# Patient Record
Sex: Female | Born: 1970 | Race: Black or African American | Hispanic: No | Marital: Married | Smoking: Never smoker
Health system: Southern US, Community
[De-identification: ages and names within clinical notes are randomized; demographics above are authoritative.]

## PROBLEM LIST (undated history)

## (undated) DIAGNOSIS — F1910 Other psychoactive substance abuse, uncomplicated: Secondary | ICD-10-CM

## (undated) DIAGNOSIS — D649 Anemia, unspecified: Secondary | ICD-10-CM

## (undated) DIAGNOSIS — J45909 Unspecified asthma, uncomplicated: Secondary | ICD-10-CM

## (undated) DIAGNOSIS — G43909 Migraine, unspecified, not intractable, without status migrainosus: Secondary | ICD-10-CM

## (undated) DIAGNOSIS — D509 Iron deficiency anemia, unspecified: Secondary | ICD-10-CM

## (undated) DIAGNOSIS — R7303 Prediabetes: Secondary | ICD-10-CM

## (undated) DIAGNOSIS — G4730 Sleep apnea, unspecified: Secondary | ICD-10-CM

## (undated) DIAGNOSIS — K219 Gastro-esophageal reflux disease without esophagitis: Secondary | ICD-10-CM

## (undated) DIAGNOSIS — F319 Bipolar disorder, unspecified: Secondary | ICD-10-CM

## (undated) DIAGNOSIS — I10 Essential (primary) hypertension: Secondary | ICD-10-CM

## (undated) DIAGNOSIS — F419 Anxiety disorder, unspecified: Secondary | ICD-10-CM

## (undated) DIAGNOSIS — E669 Obesity, unspecified: Secondary | ICD-10-CM

## (undated) DIAGNOSIS — J449 Chronic obstructive pulmonary disease, unspecified: Secondary | ICD-10-CM

## (undated) DIAGNOSIS — M1990 Unspecified osteoarthritis, unspecified site: Secondary | ICD-10-CM

## (undated) DIAGNOSIS — F1911 Other psychoactive substance abuse, in remission: Secondary | ICD-10-CM

## (undated) HISTORY — DX: Unspecified asthma, uncomplicated: J45.909

## (undated) HISTORY — PX: FRACTURE SURGERY: SHX138

---

## 1999-05-25 ENCOUNTER — Emergency Department (HOSPITAL_COMMUNITY): Admission: EM | Admit: 1999-05-25 | Discharge: 1999-05-25 | Payer: Self-pay | Admitting: Emergency Medicine

## 1999-10-13 ENCOUNTER — Emergency Department (HOSPITAL_COMMUNITY): Admission: EM | Admit: 1999-10-13 | Discharge: 1999-10-13 | Payer: Self-pay | Admitting: Podiatry

## 1999-10-13 ENCOUNTER — Encounter: Payer: Self-pay | Admitting: Emergency Medicine

## 2001-08-01 ENCOUNTER — Encounter: Payer: Self-pay | Admitting: Obstetrics

## 2001-08-01 ENCOUNTER — Inpatient Hospital Stay (HOSPITAL_COMMUNITY): Admission: AD | Admit: 2001-08-01 | Discharge: 2001-08-01 | Payer: Self-pay | Admitting: Obstetrics

## 2001-08-04 ENCOUNTER — Inpatient Hospital Stay (HOSPITAL_COMMUNITY): Admission: AD | Admit: 2001-08-04 | Discharge: 2001-08-04 | Payer: Self-pay | Admitting: Obstetrics

## 2001-08-29 ENCOUNTER — Inpatient Hospital Stay (HOSPITAL_COMMUNITY): Admission: AD | Admit: 2001-08-29 | Discharge: 2001-08-29 | Payer: Self-pay | Admitting: Obstetrics

## 2001-10-10 ENCOUNTER — Inpatient Hospital Stay (HOSPITAL_COMMUNITY): Admission: AD | Admit: 2001-10-10 | Discharge: 2001-10-10 | Payer: Self-pay | Admitting: Obstetrics

## 2001-10-22 ENCOUNTER — Encounter (INDEPENDENT_AMBULATORY_CARE_PROVIDER_SITE_OTHER): Payer: Self-pay | Admitting: Specialist

## 2001-10-22 ENCOUNTER — Inpatient Hospital Stay (HOSPITAL_COMMUNITY): Admission: AD | Admit: 2001-10-22 | Discharge: 2001-10-24 | Payer: Self-pay | Admitting: Obstetrics

## 2001-11-05 ENCOUNTER — Emergency Department (HOSPITAL_COMMUNITY): Admission: EM | Admit: 2001-11-05 | Discharge: 2001-11-05 | Payer: Self-pay | Admitting: *Deleted

## 2002-11-04 ENCOUNTER — Encounter (INDEPENDENT_AMBULATORY_CARE_PROVIDER_SITE_OTHER): Payer: Self-pay | Admitting: *Deleted

## 2002-11-04 ENCOUNTER — Inpatient Hospital Stay (HOSPITAL_COMMUNITY): Admission: AD | Admit: 2002-11-04 | Discharge: 2002-11-06 | Payer: Self-pay | Admitting: *Deleted

## 2002-11-24 ENCOUNTER — Other Ambulatory Visit (HOSPITAL_COMMUNITY): Admission: RE | Admit: 2002-11-24 | Discharge: 2002-12-04 | Payer: Self-pay | Admitting: Psychiatry

## 2003-05-26 ENCOUNTER — Emergency Department (HOSPITAL_COMMUNITY): Admission: AC | Admit: 2003-05-26 | Discharge: 2003-05-26 | Payer: Self-pay

## 2003-05-26 ENCOUNTER — Encounter: Payer: Self-pay | Admitting: Emergency Medicine

## 2003-05-28 ENCOUNTER — Emergency Department (HOSPITAL_COMMUNITY): Admission: EM | Admit: 2003-05-28 | Discharge: 2003-05-28 | Payer: Self-pay

## 2003-05-31 ENCOUNTER — Encounter: Payer: Self-pay | Admitting: Orthopedic Surgery

## 2003-05-31 ENCOUNTER — Inpatient Hospital Stay (HOSPITAL_COMMUNITY): Admission: RE | Admit: 2003-05-31 | Discharge: 2003-06-04 | Payer: Self-pay | Admitting: Orthopedic Surgery

## 2003-06-26 ENCOUNTER — Emergency Department (HOSPITAL_COMMUNITY): Admission: EM | Admit: 2003-06-26 | Discharge: 2003-06-26 | Payer: Self-pay | Admitting: Emergency Medicine

## 2010-10-13 ENCOUNTER — Ambulatory Visit (HOSPITAL_COMMUNITY)
Admission: RE | Admit: 2010-10-13 | Discharge: 2010-10-13 | Payer: Self-pay | Source: Home / Self Care | Attending: Psychiatry | Admitting: Psychiatry

## 2010-10-20 ENCOUNTER — Other Ambulatory Visit (HOSPITAL_COMMUNITY)
Admission: RE | Admit: 2010-10-20 | Discharge: 2010-11-04 | Payer: Self-pay | Source: Home / Self Care | Attending: Psychiatry | Admitting: Psychiatry

## 2010-10-28 ENCOUNTER — Ambulatory Visit: Admit: 2010-10-28 | Payer: Self-pay | Admitting: Family Medicine

## 2010-10-28 ENCOUNTER — Encounter (INDEPENDENT_AMBULATORY_CARE_PROVIDER_SITE_OTHER): Payer: Self-pay | Admitting: *Deleted

## 2010-11-09 ENCOUNTER — Encounter: Payer: Self-pay | Admitting: Family Medicine

## 2010-11-12 ENCOUNTER — Ambulatory Visit
Admission: RE | Admit: 2010-11-12 | Discharge: 2010-11-12 | Payer: Self-pay | Source: Home / Self Care | Attending: Family Medicine | Admitting: Family Medicine

## 2010-11-12 DIAGNOSIS — I10 Essential (primary) hypertension: Secondary | ICD-10-CM | POA: Insufficient documentation

## 2010-11-12 DIAGNOSIS — F3289 Other specified depressive episodes: Secondary | ICD-10-CM | POA: Insufficient documentation

## 2010-11-12 DIAGNOSIS — F1421 Cocaine dependence, in remission: Secondary | ICD-10-CM | POA: Insufficient documentation

## 2010-11-12 DIAGNOSIS — K219 Gastro-esophageal reflux disease without esophagitis: Secondary | ICD-10-CM | POA: Insufficient documentation

## 2010-11-12 DIAGNOSIS — M549 Dorsalgia, unspecified: Secondary | ICD-10-CM | POA: Insufficient documentation

## 2010-11-12 DIAGNOSIS — E669 Obesity, unspecified: Secondary | ICD-10-CM | POA: Insufficient documentation

## 2010-11-12 DIAGNOSIS — F329 Major depressive disorder, single episode, unspecified: Secondary | ICD-10-CM | POA: Insufficient documentation

## 2010-11-20 NOTE — Letter (Signed)
Summary: [place] No Show Letter   at [place]  [address] [place], [place] [address]   Phone: [phone]  Fax: [phone]    10/28/2010 MRN: [id number]  HALYNN REITANO [address] [place], [place]  [address]   Dear Ms. Martell,   Our records indicate that you missed your scheduled appointment with ______Dr.Tabori_______ on ____1/10/2012______.  Please contact this office to reschedule your appointment as soon as possible.  It is important that you keep your scheduled appointments with your physician, so we can provide you the best care possible.  Please be advised that there may be a charge for "no show" appointments.    Sincerely,    at [facility]

## 2010-11-27 ENCOUNTER — Encounter: Payer: Self-pay | Admitting: Family Medicine

## 2010-11-27 ENCOUNTER — Encounter (INDEPENDENT_AMBULATORY_CARE_PROVIDER_SITE_OTHER): Payer: Self-pay | Admitting: *Deleted
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
## 2010-12-04 NOTE — Letter (Signed)
Summary: [place] No Show Letter  [place] at [place]  [address] [place], [place] [address]   Phone: [phone]  Fax: [phone]    11/27/2010 MRN: [id number]  Danielle [name] [address] [place], [place]  [address]   Dear Ms. Nordmann,   Our records indicate that you missed your scheduled appointment with ______Dr.Tabori___________ on _____2/9/12______.  Please contact this office to reschedule your appointment as soon as possible.  It is important that you keep your scheduled appointments with your physician, so we can provide you the best care possible.  Please be advised that there may be a charge for "no show" appointments.    Sincerely,   [place] at [facility]

## 2010-12-17 ENCOUNTER — Ambulatory Visit: Payer: Self-pay | Admitting: Family Medicine

## 2011-02-06 ENCOUNTER — Other Ambulatory Visit: Payer: Self-pay | Admitting: Internal Medicine

## 2011-02-06 DIAGNOSIS — Z1231 Encounter for screening mammogram for malignant neoplasm of breast: Secondary | ICD-10-CM

## 2011-02-12 ENCOUNTER — Ambulatory Visit: Payer: Self-pay
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
## 2011-06-14 ENCOUNTER — Other Ambulatory Visit: Payer: Self-pay | Admitting: Family Medicine

## 2011-06-15 NOTE — Telephone Encounter (Signed)
Last ov 11/12/10. Last filled 02/24/11

## 2011-08-29 ENCOUNTER — Other Ambulatory Visit: Payer: Self-pay | Admitting: Family Medicine

## 2011-09-01 NOTE — Telephone Encounter (Signed)
Last OV 11-12-10 last refill 8 26-12

## 2011-11-09 ENCOUNTER — Other Ambulatory Visit: Payer: Self-pay | Admitting: Family Medicine

## 2011-11-09 NOTE — Telephone Encounter (Signed)
Last OV 11-12-10 last refill 08-29-11 #60 with no refills noted 2 no show appts

## 2011-11-09 NOTE — Telephone Encounter (Signed)
No refills. Needs OV.

## 2011-11-10 NOTE — Telephone Encounter (Signed)
Tried to call pt and leave message to call office however after vm came on no option to leave message was available, thus  Letter has been mailed to pt address noted in the chart to advise they are overdue for cpe/ov/labs and the pt needs to contact office to set up appt  [facility]ed [facility] to advise not rx to be given at this time

## 2012-02-22 ENCOUNTER — Emergency Department (HOSPITAL_COMMUNITY)
Admission: EM | Admit: 2012-02-22 | Discharge: 2012-02-22 | Disposition: A | Discharge status: Home / Self Care | Payer: Medicare Other | Attending: Emergency Medicine | Admitting: Emergency Medicine

## 2012-02-22 ENCOUNTER — Emergency Department (HOSPITAL_COMMUNITY): Payer: Medicare Other

## 2012-02-22 DIAGNOSIS — M7989 Other specified soft tissue disorders: Secondary | ICD-10-CM | POA: Diagnosis not present

## 2012-02-22 DIAGNOSIS — W010XXA Fall on same level from slipping, tripping and stumbling without subsequent striking against object, initial encounter: Secondary | ICD-10-CM | POA: Diagnosis not present

## 2012-02-22 DIAGNOSIS — S8261XA Displaced fracture of lateral malleolus of right fibula, initial encounter for closed fracture: Secondary | ICD-10-CM

## 2012-02-22 DIAGNOSIS — S8263XA Displaced fracture of lateral malleolus of unspecified fibula, initial encounter for closed fracture: Secondary | ICD-10-CM | POA: Diagnosis not present

## 2012-02-22 DIAGNOSIS — M25579 Pain in unspecified ankle and joints of unspecified foot: Secondary | ICD-10-CM | POA: Insufficient documentation

## 2012-02-22 MED ORDER — ONDANSETRON 8 MG PO TBDP
8.0000 mg | ORAL_TABLET | Freq: Once | ORAL | Status: AC
  Administered 2012-02-22: 8 mg via ORAL
  Filled 2012-02-22: qty 1

## 2012-02-22 MED ORDER — PERCOCET 5-325 MG PO TABS: 1.0000 | ORAL_TABLET | ORAL | Status: AC | PRN

## 2012-02-22 MED ORDER — OXYCODONE-ACETAMINOPHEN 5-325 MG PO TABS
1.0000 | ORAL_TABLET | Freq: Once | ORAL | Status: AC
  Administered 2012-02-22: 1 via ORAL
  Filled 2012-02-22: qty 1
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
## 2012-02-22 NOTE — Discharge Instructions (Signed)
Please read the information below.  Call Dr Rennis Chris (your previous surgeon) to schedule a close follow up appointment.  Use the walker for support and keep weight off of your leg while you heal.  Elevate your leg several times throughout the day and use ice or cold compresses in addition to the pain medication.  You may take additional ibuprofen if needed for pain control but do not use additional tylenol while using the prescribed pain medication.  You may return to the ER at any time for worsening condition or any new symptoms that concern you.   Ankle Fracture A fracture is a break in the bone. A cast or splint is used to protect and keep your injured bone from moving.  HOME CARE INSTRUCTIONS   Use your crutches as directed.   To lessen the swelling, keep the injured leg elevated while sitting or lying down.   Apply ice to the injury for 15 to 20 minutes, 3 to 4 times per day while awake for 2 days. Put the ice in a plastic bag and place a thin towel between the bag of ice and your cast.   If you have a plaster or fiberglass cast:   Do not try to scratch the skin under the cast using sharp or pointed objects.   Check the skin around the cast every day. You may put lotion on any red or sore areas.   Keep your cast dry and clean.   If you have a plaster splint:   Wear the splint as directed.   You may loosen the elastic around the splint if your toes become numb, tingle, or turn cold or blue.   Do not put pressure on any part of your cast or splint; it may break. Rest your cast only on a pillow the first 24 hours until it is fully hardened.   Your cast or splint can be protected during bathing with a plastic bag. Do not lower the cast or splint into water.   Take medications as directed by your caregiver. Only take over-the-counter or prescription medicines for pain, discomfort, or fever as directed by your caregiver.   Do not drive a vehicle until your caregiver specifically tells  you it is safe to do so.   If your caregiver has given you a follow-up appointment, it is very important to keep that appointment. Not keeping the appointment could result in a chronic or permanent injury, pain, and disability. If there is any problem keeping the appointment, you must call back to this facility for assistance.  SEEK IMMEDIATE MEDICAL CARE IF:   Your cast gets damaged or breaks.   You have continued severe pain or more swelling than you did before the cast was put on.   Your skin or toenails below the injury turn blue or gray, or feel cold or numb.   There is a bad smell or new stains and/or purulent (pus like) drainage coming from under the cast.  If you do not have a window in your cast for observing the wound, a discharge or minor bleeding may show up as a stain on the outside of your cast. Report these findings to your caregiver. MAKE SURE YOU:   Understand these instructions.   Will watch your condition.   Will get help right away if you are not doing well or get worse.  Document Released: 10/02/2000 Document Revised: 09/24/2011 Document Reviewed: 05/08/2008 [facility] Patient Information 2012 [place], [place].  Cast or Splint Care  Casts and splints support injured limbs and keep bones from moving while they heal.  HOME CARE  Keep the cast or splint uncovered during the drying period.   A plaster cast can take 24 to 48 hours to dry.   A fiberglass cast will dry in less than 1 hour.   Do not rest the cast on anything harder than a pillow for 24 hours.   Do not put weight on your injured limb. Do not put pressure on the cast. Wait for your doctor's approval.   Keep the cast or splint dry.   Cover the cast or splint with a plastic bag during baths or wet weather.   If you have a cast over your chest and belly (trunk), take sponge baths until the cast is taken off.   Keep your cast or splint clean. Wash a dirty cast with a damp cloth.   Do not put any  objects under your cast or splint. Do not scratch the skin under the cast with an object.   Do not take out the padding from inside your cast.   Exercise your joints near the cast as told by your doctor.   Raise (elevate) your injured limb on 1 or 2 pillows for the first 1 to 3 days.  GET HELP RIGHT AWAY IF:  Your cast or splint cracks.   Your cast or splint is too tight or too loose.   You itch badly under the cast.   Your cast gets wet or has a soft spot.   You have a bad smell coming from the cast.   You get an object stuck under the cast.   Your skin around the cast becomes red or raw.   You have new or more pain after the cast is put on.   You have fluid leaking through the cast.   You cannot move your fingers or toes.   Your fingers or toes turn colors or are cool, painful, or puffy (swollen).   You have tingling or lose feeling (numbness) around the injured area.   You have pain or pressure under the cast.   You have trouble breathing or have shortness of breath.   You have chest pain.  MAKE SURE YOU:  Understand these instructions.   Will watch your condition.   Will get help right away if you are not doing well or get worse.  Document Released: 02/04/2011 Document Revised: 09/24/2011 Document Reviewed: 02/04/2011 [facility] Patient Information 2012 [place], [place].

## 2012-02-22 NOTE — ED Notes (Signed)
Pt reports walking down hill outside and slid and fell heard a pop and hurt right ankle.

## 2012-02-22 NOTE — ED Provider Notes (Signed)
Medical screening examination/treatment/procedure(s) were performed by non-physician practitioner and as supervising physician I was immediately available for consultation/collaboration.  Hurman Horn, MD 02/22/12 [phone]

## 2012-02-22 NOTE — ED Provider Notes (Signed)
History     CSN: [id number]  Arrival date & time 02/22/12  1236   First MD Initiated Contact with Patient 02/22/12 1309      Chief Complaint  Patient presents with  . Ankle Pain    (Consider location/radiation/quality/duration/timing/severity/associated sxs/prior treatment) HPI Comments: Patient reports she was walking down a hill and slipped, rolling her right ankle.  States she heard it pop and felt immediate pain in her lateral malleolus with radiation into her shin and dorsal foot.  Reports decreased ROM of ankle secondary to pain.  Denies other injury, weakness or numbness of her foot.    Patient is a 41 y.o. female presenting with ankle pain. The history is provided by the patient.  Ankle Pain  The incident occurred 1 to 2 hours ago. Pertinent negatives include no numbness, no muscle weakness, no loss of sensation and no tingling. She has tried nothing for the symptoms.    No past medical history on file.  No past surgical history on file.  No family history on file.  History  Substance Use Topics  . Smoking status: Not on file  . Smokeless tobacco: Not on file  . Alcohol Use: Not on file    OB History    No data available      Review of Systems  Cardiovascular: Positive for leg swelling.  Skin: Negative for color change and rash.  Neurological: Negative for tingling, weakness and numbness.  Psychiatric/Behavioral: Negative for confusion.    Allergies  Review of patient's allergies indicates no known allergies.  Home Medications   Current Outpatient Rx  Name Route Sig Dispense Refill  . CYCLOBENZAPRINE HCL 10 MG PO TABS Oral Take 10 mg by mouth at bedtime as needed. Muscles relaxion    . FLUTICASONE PROPIONATE 50 MCG/ACT NA SUSP Nasal Place 2 sprays into the nose daily.    . IBUPROFEN 800 MG PO TABS  take 1 tablet by mouth three times a day if needed for BACK PAIN with food 60 tablet 0  . OMEPRAZOLE 20 MG PO CPDR Oral Take 40 mg by mouth daily.    .  TRAMADOL HCL 50 MG PO TABS Oral Take 50 mg by mouth 2 (two) times daily as needed. pain      BP 164/102  Pulse 68  Temp(Src) 98.4 F (36.9 C) (Oral)  SpO2 100%  Physical Exam  Nursing note and vitals reviewed. Constitutional: She is oriented to person, place, and time. She appears well-developed and well-nourished. No distress.  HENT:  Head: Normocephalic and atraumatic.  Neck: Neck supple.  Pulmonary/Chest: Effort normal.  Musculoskeletal:       Legs:      Feet:       Dorsalis pedis and posterior tibialis pulses intact, sensation intact, capillary refill < 2 seconds in all toes.    Neurological: She is alert and oriented to person, place, and time.  Skin: She is not diaphoretic.    ED Course  Procedures (including critical care time)  Labs Reviewed - No data to display Dg Tibia/fibula Right  02/22/2012  *RADIOLOGY REPORT*  Clinical Data: Ankle pain post fall  RIGHT TIBIA AND FIBULA - 2 VIEW  Comparison: None.  Findings: Three views of the right tibia-fibula submitted.  No acute fracture is noted.  Postsurgical metallic fixation plate and screws noted on lateral aspect of the tibial plateau.  There is a healed fracture of the lateral tibial plateau.  Healed fracture of proximal right fibula. Degenerative changes are noted  lateral aspect of tibial plateau.  IMPRESSION: No acute fracture is noted.  Postsurgical metallic fixation plate and screws noted on lateral aspect of the tibial plateau.  There is a healed fracture of the lateral tibial plateau.  Healed fracture of proximal right fibula. Degenerative changes lateral aspect of the tibial plateau.  Original Report Authenticated By: Natasha Mead, M.D.   Dg Ankle Complete Right  02/22/2012  *RADIOLOGY REPORT*  Clinical Data: Ankle pain.  RIGHT ANKLE - COMPLETE 3+ VIEW  Comparison: None.  Findings: There is a nondisplaced fracture of the distal fibular metaphysis.  Vertical and transverse components are identified. Overlying soft tissue  swelling.  No additional evidence of fracture.  IMPRESSION: Lateral malleolar fracture with overlying soft tissue swelling.  Original Report Authenticated By: Reyes Ivan, M.D.   Dg Foot Complete Right  02/22/2012  *RADIOLOGY REPORT*  Clinical Data: Pain.  RIGHT FOOT COMPLETE - 3+ VIEW  Comparison: None.  Findings: Lateral malleolar fracture is incidentally imaged.  No additional evidence of acute fracture.  IMPRESSION: Lateral malleolar fracture is incidentally imaged.  No additional evidence of acute fracture.  Original Report Authenticated By: Reyes Ivan, M.D.    Filed Vitals:   02/22/12 1704  BP: 160/82  Pulse: 70  Temp: 98 F (36.7 C)    1. Fracture of right ankle, lateral malleolus       MDM  Patient with injury to right ankle by slip and inversion.  Pt with fractured lateral malleolus.  Placed in splint in ED.  Pt declined crutches, prefers walker which I have prescribed.  Pt previously had surgery on same leg by Dr Rennis Chris.  Pt d/c home with percocet, return precautions, ortho follow up.  Patient verbalizes understanding and agrees with plan.         Rise Patience, [place] 02/22/12 1710

## 2012-02-22 NOTE — ED Notes (Signed)
WUJ:[id number]<JY> Expected date:<BR> Expected time:12:33 PM<BR> Means of arrival:Ambulance<BR> Comments:<BR> PTAR 27 -- Fall/Ankle Injury

## 2012-03-15 ENCOUNTER — Encounter (HOSPITAL_BASED_OUTPATIENT_CLINIC_OR_DEPARTMENT_OTHER): Payer: Self-pay | Admitting: *Deleted

## 2012-03-16 ENCOUNTER — Ambulatory Visit (HOSPITAL_BASED_OUTPATIENT_CLINIC_OR_DEPARTMENT_OTHER): Payer: Medicare Other | Admitting: Anesthesiology

## 2012-03-16 ENCOUNTER — Ambulatory Visit (HOSPITAL_BASED_OUTPATIENT_CLINIC_OR_DEPARTMENT_OTHER)
Admission: RE | Admit: 2012-03-16 | Discharge: 2012-03-16 | Disposition: A | Discharge status: Home / Self Care | Payer: Medicare Other | Source: Ambulatory Visit | Attending: Orthopedic Surgery | Admitting: Orthopedic Surgery

## 2012-03-16 ENCOUNTER — Encounter (HOSPITAL_BASED_OUTPATIENT_CLINIC_OR_DEPARTMENT_OTHER): Payer: Self-pay

## 2012-03-16 ENCOUNTER — Encounter (HOSPITAL_BASED_OUTPATIENT_CLINIC_OR_DEPARTMENT_OTHER): Payer: Self-pay | Admitting: Anesthesiology

## 2012-03-16 ENCOUNTER — Encounter (HOSPITAL_BASED_OUTPATIENT_CLINIC_OR_DEPARTMENT_OTHER)
Admission: RE | Disposition: A | Discharge status: Home / Self Care | Payer: Self-pay | Source: Ambulatory Visit | Attending: Orthopedic Surgery

## 2012-03-16 DIAGNOSIS — I10 Essential (primary) hypertension: Secondary | ICD-10-CM | POA: Insufficient documentation

## 2012-03-16 DIAGNOSIS — G4730 Sleep apnea, unspecified: Secondary | ICD-10-CM | POA: Insufficient documentation

## 2012-03-16 DIAGNOSIS — X58XXXA Exposure to other specified factors, initial encounter: Secondary | ICD-10-CM | POA: Insufficient documentation

## 2012-03-16 DIAGNOSIS — K219 Gastro-esophageal reflux disease without esophagitis: Secondary | ICD-10-CM | POA: Insufficient documentation

## 2012-03-16 DIAGNOSIS — E669 Obesity, unspecified: Secondary | ICD-10-CM | POA: Insufficient documentation

## 2012-03-16 DIAGNOSIS — S8263XA Displaced fracture of lateral malleolus of unspecified fibula, initial encounter for closed fracture: Secondary | ICD-10-CM | POA: Insufficient documentation

## 2012-03-16 DIAGNOSIS — S82899A Other fracture of unspecified lower leg, initial encounter for closed fracture: Secondary | ICD-10-CM

## 2012-03-16 HISTORY — DX: Bipolar disorder, unspecified: F31.9

## 2012-03-16 HISTORY — DX: Other psychoactive substance abuse, in remission: F19.11

## 2012-03-16 HISTORY — DX: Gastro-esophageal reflux disease without esophagitis: K21.9

## 2012-03-16 HISTORY — DX: Sleep apnea, unspecified: G47.30

## 2012-03-16 HISTORY — PX: ORIF ANKLE FRACTURE: SHX5408

## 2012-03-16 SURGERY — OPEN REDUCTION INTERNAL FIXATION (ORIF) ANKLE FRACTURE
Anesthesia: General | Site: Ankle | Laterality: Right | Wound class: Clean

## 2012-03-16 MED ORDER — HYDROMORPHONE HCL PF 1 MG/ML IJ SOLN
0.2500 mg | INTRAMUSCULAR | Status: DC | PRN
  Administered 2012-03-16: 0.5 mg via INTRAVENOUS

## 2012-03-16 MED ORDER — CITRIC ACID-SODIUM CITRATE 334-500 MG/5ML PO SOLN
30.0000 mL | Freq: Once | ORAL | Status: AC
  Administered 2012-03-16: 15 mL via ORAL

## 2012-03-16 MED ORDER — SODIUM CHLORIDE 0.9 % IV SOLN: INTRAVENOUS | Status: DC

## 2012-03-16 MED ORDER — LIDOCAINE HCL (CARDIAC) 20 MG/ML IV SOLN
INTRAVENOUS | Status: DC | PRN
  Administered 2012-03-16: 80 mg via INTRAVENOUS

## 2012-03-16 MED ORDER — ONDANSETRON HCL 4 MG/2ML IJ SOLN
INTRAMUSCULAR | Status: DC | PRN
  Administered 2012-03-16: 4 mg via INTRAVENOUS

## 2012-03-16 MED ORDER — BUPIVACAINE-EPINEPHRINE PF 0.5-1:200000 % IJ SOLN
INTRAMUSCULAR | Status: DC | PRN
  Administered 2012-03-16: 50 mL

## 2012-03-16 MED ORDER — LACTATED RINGERS IV SOLN
INTRAVENOUS | Status: DC
  Administered 2012-03-16: 14:00:00 via INTRAVENOUS

## 2012-03-16 MED ORDER — DEXAMETHASONE SODIUM PHOSPHATE 4 MG/ML IJ SOLN
INTRAMUSCULAR | Status: DC | PRN
  Administered 2012-03-16: 10 mg via INTRAVENOUS

## 2012-03-16 MED ORDER — PROPOFOL 10 MG/ML IV EMUL
INTRAVENOUS | Status: DC | PRN
  Administered 2012-03-16: 300 mg via INTRAVENOUS

## 2012-03-16 MED ORDER — METOCLOPRAMIDE HCL 5 MG/ML IJ SOLN
INTRAMUSCULAR | Status: DC | PRN
  Administered 2012-03-16: 10 mg via INTRAVENOUS

## 2012-03-16 MED ORDER — MIDAZOLAM HCL 2 MG/2ML IJ SOLN
1.0000 mg | INTRAMUSCULAR | Status: DC | PRN
  Administered 2012-03-16: 3 mg via INTRAVENOUS

## 2012-03-16 MED ORDER — FENTANYL CITRATE 0.05 MG/ML IJ SOLN
INTRAMUSCULAR | Status: DC | PRN
  Administered 2012-03-16 (×2): 25 ug via INTRAVENOUS

## 2012-03-16 MED ORDER — FENTANYL CITRATE 0.05 MG/ML IJ SOLN
50.0000 ug | INTRAMUSCULAR | Status: DC | PRN
  Administered 2012-03-16 (×2): 100 ug via INTRAVENOUS

## 2012-03-16 MED ORDER — OXYCODONE-ACETAMINOPHEN 5-325 MG PO TABS: 1.0000 | ORAL_TABLET | ORAL | Status: AC | PRN

## 2012-03-16 MED ORDER — VITAMIN C 500 MG PO TABS: 500.0000 mg | ORAL_TABLET | Freq: Every day | ORAL | Status: AC

## 2012-03-16 MED ORDER — ASPIRIN EC 325 MG PO TBEC: 325.0000 mg | DELAYED_RELEASE_TABLET | Freq: Two times a day (BID) | ORAL | Status: AC

## 2012-03-16 MED ORDER — DEXAMETHASONE SODIUM PHOSPHATE 10 MG/ML IJ SOLN
INTRAMUSCULAR | Status: DC | PRN
  Administered 2012-03-16: 4 mg

## 2012-03-16 MED ORDER — CEFAZOLIN SODIUM 1-5 GM-% IV SOLN
1.0000 g | INTRAVENOUS | Status: AC
  Administered 2012-03-16: 2 g via INTRAVENOUS

## 2012-03-16 MED ORDER — CHLORHEXIDINE GLUCONATE 4 % EX LIQD: 60.0000 mL | Freq: Once | CUTANEOUS | Status: DC

## 2012-03-16 MED ORDER — LORAZEPAM 2 MG/ML IJ SOLN: 1.0000 mg | Freq: Once | INTRAMUSCULAR | Status: DC | PRN

## 2012-03-16 MED ORDER — LACTATED RINGERS IV SOLN
INTRAVENOUS | Status: DC
  Administered 2012-03-16 (×2): via INTRAVENOUS

## 2012-03-16 SURGICAL SUPPLY — 64 items
BANDAGE ELASTIC 4 VELCRO ST LF (GAUZE/BANDAGES/DRESSINGS) IMPLANT
BANDAGE ELASTIC 6 VELCRO ST LF (GAUZE/BANDAGES/DRESSINGS) ×2 IMPLANT
BIT DRILL 2.5X110 QC LCP DISP (BIT) ×2 IMPLANT
BLADE SURG 15 STRL LF DISP TIS (BLADE) ×2 IMPLANT
BLADE SURG 15 STRL SS (BLADE) ×2
BRUSH SCRUB EZ PLAIN DRY (MISCELLANEOUS) ×2 IMPLANT
BUR EGG 3PK/BX (BURR) IMPLANT
CLOTH BEACON ORANGE TIMEOUT ST (SAFETY) ×2 IMPLANT
COTTON STERILE ROLL (GAUZE/BANDAGES/DRESSINGS) ×2 IMPLANT
COVER TABLE BACK 60X90 (DRAPES) ×2 IMPLANT
CUFF TOURNIQUET SINGLE 34IN LL (TOURNIQUET CUFF) IMPLANT
DRAPE EXTREMITY T 121X128X90 (DRAPE) ×2 IMPLANT
DRAPE OEC MINIVIEW 54X84 (DRAPES) IMPLANT
DRAPE SURG 17X23 STRL (DRAPES) ×4 IMPLANT
DRSG PAD ABDOMINAL 8X10 ST (GAUZE/BANDAGES/DRESSINGS) IMPLANT
DURA STEPPER LG (CAST SUPPLIES) IMPLANT
DURA STEPPER MED (CAST SUPPLIES) IMPLANT
ELECT REM PT RETURN 9FT ADLT (ELECTROSURGICAL) ×2
ELECTRODE REM PT RTRN 9FT ADLT (ELECTROSURGICAL) ×1 IMPLANT
GAUZE SPONGE 4X4 16PLY XRAY LF (GAUZE/BANDAGES/DRESSINGS) IMPLANT
GAUZE XEROFORM 1X8 LF (GAUZE/BANDAGES/DRESSINGS) ×2 IMPLANT
GAUZE XEROFORM 5X9 LF (GAUZE/BANDAGES/DRESSINGS) IMPLANT
GLOVE BIO SURGEON STRL SZ7.5 (GLOVE) ×2 IMPLANT
GLOVE BIO SURGEON STRL SZ8 (GLOVE) ×2 IMPLANT
GLOVE BIOGEL M STRL SZ7.5 (GLOVE) ×2 IMPLANT
GLOVE BIOGEL PI IND STRL 8 (GLOVE) ×4 IMPLANT
GLOVE BIOGEL PI INDICATOR 8 (GLOVE) ×4
GLOVE SURG SS PI 8.0 STRL IVOR (GLOVE) ×2 IMPLANT
GOWN BRE IMP PREV XXLGXLNG (GOWN DISPOSABLE) IMPLANT
GOWN PREVENTION PLUS XLARGE (GOWN DISPOSABLE) IMPLANT
KIT 1/3 TUB PL 4H 49M (Orthopedic Implant) ×1 IMPLANT
NEEDLE HYPO 22GX1.5 SAFETY (NEEDLE) IMPLANT
NS IRRIG 1000ML POUR BTL (IV SOLUTION) ×2 IMPLANT
PACK BASIN DAY SURGERY FS (CUSTOM PROCEDURE TRAY) ×2 IMPLANT
PAD CAST 4YDX4 CTTN HI CHSV (CAST SUPPLIES) ×1 IMPLANT
PADDING CAST ABS 4INX4YD NS (CAST SUPPLIES)
PADDING CAST ABS COTTON 4X4 ST (CAST SUPPLIES) IMPLANT
PADDING CAST COTTON 4X4 STRL (CAST SUPPLIES) ×1
PENCIL BUTTON HOLSTER BLD 10FT (ELECTRODE) ×2 IMPLANT
PROS 1/3 TUB PL 4H 49M (Orthopedic Implant) ×2 IMPLANT
SCREW CORTEX 3.5 16MM (Screw) ×2 IMPLANT
SCREW CORTEX 3.5 22MM (Screw) ×2 IMPLANT
SCREW LOCK CORT ST 3.5X16 (Screw) ×2 IMPLANT
SCREW LOCK CORT ST 3.5X22 (Screw) ×2 IMPLANT
SHEET MEDIUM DRAPE 40X70 STRL (DRAPES) ×4 IMPLANT
SLEEVE SCD COMPRESS KNEE MED (MISCELLANEOUS) IMPLANT
SPLINT FAST PLASTER 5X30 (CAST SUPPLIES) ×20
SPLINT PLASTER CAST FAST 5X30 (CAST SUPPLIES) ×20 IMPLANT
SPONGE GAUZE 4X4 12PLY (GAUZE/BANDAGES/DRESSINGS) ×2 IMPLANT
STOCKINETTE 6  STRL (DRAPES) ×1
STOCKINETTE 6 STRL (DRAPES) ×1 IMPLANT
SUCTION FRAZIER TIP 10 FR DISP (SUCTIONS) ×4 IMPLANT
SUT ETHILON 3 0 PS 1 (SUTURE) IMPLANT
SUT ETHILON 4 0 PS 2 18 (SUTURE) ×6 IMPLANT
SUT VIC AB 2-0 SH 27 (SUTURE)
SUT VIC AB 2-0 SH 27XBRD (SUTURE) IMPLANT
SUT VIC AB 3-0 PS1 18 (SUTURE) ×1
SUT VIC AB 3-0 PS1 18XBRD (SUTURE) ×1 IMPLANT
SYR BULB 3OZ (MISCELLANEOUS) ×2 IMPLANT
TOWEL OR 17X24 6PK STRL BLUE (TOWEL DISPOSABLE) ×4 IMPLANT
TOWEL OR NON WOVEN STRL DISP B (DISPOSABLE) ×2 IMPLANT
TUBE CONNECTING 20X1/4 (TUBING) ×4 IMPLANT
UNDERPAD 30X30 INCONTINENT (UNDERPADS AND DIAPERS) ×2 IMPLANT
WATER STERILE IRR 1000ML POUR (IV SOLUTION) ×2 IMPLANT

## 2012-03-16 NOTE — Anesthesia Postprocedure Evaluation (Signed)
  Anesthesia Post-op Note  [name]  Procedure(s) Performed: Procedure(s) (LRB): OPEN REDUCTION INTERNAL FIXATION (ORIF) ANKLE FRACTURE (Right)  Patient Location: PACU  Anesthesia Type: GA combined with regional for post-op pain  Level of Consciousness: awake and alert   Airway and Oxygen Therapy: Patient Spontanous Breathing  Post-op Pain: mild  Post-op Assessment: Post-op Vital signs reviewed, Patient's Cardiovascular Status Stable, Respiratory Function Stable and Patent Airway  Post-op Vital Signs: stable  Complications: No apparent anesthesia complications

## 2012-03-16 NOTE — Transfer of Care (Signed)
Immediate Anesthesia Transfer of Care Note  [name]  Procedure(s) Performed: Procedure(s) (LRB): OPEN REDUCTION INTERNAL FIXATION (ORIF) ANKLE FRACTURE (Right)  Patient Location: PACU  Anesthesia Type: GA combined with regional for post-op pain  Level of Consciousness: awake, alert  and oriented  Airway & Oxygen Therapy: Patient Spontanous Breathing and Patient connected to face mask oxygen  Post-op Assessment: Report given to PACU RN and Post -op Vital signs reviewed and stable  Post vital signs: Reviewed and stable  Complications: No apparent anesthesia complications

## 2012-03-16 NOTE — Progress Notes (Signed)
Assisted Dr. Kasik with right, popliteal block. Side rails up, monitors on throughout procedure. See vital signs in flow sheet. Tolerated Procedure well. 

## 2012-03-16 NOTE — H&P (Signed)
  H&P documentation: Placed to be scanned history and physical exam in chart.  -History and Physical Reviewed  -Patient has been re-examined  -No change in the plan of care  Frannie Shedrick A  

## 2012-03-16 NOTE — Brief Op Note (Signed)
03/16/2012  3:36 PM  PATIENT:  Danielle [name]  41 y.o. female  PRE-OPERATIVE DIAGNOSIS:  right lateral malleolus fracture  POST-OPERATIVE DIAGNOSIS:  right lateral malleolus fracture  PROCEDURE:  Procedure(s) (LRB): OPEN REDUCTION INTERNAL FIXATION (ORIF) ANKLE FRACTURE (Right)  SURGEON:  Surgeon(s) and Role:    * Sherri Rad, MD - Primary  PHYSICIAN ASSISTANT: Rexene Edison, PAC   ASSISTANTS: Above   ANESTHESIA:   general  EBL:  Total I/O In: 100 [I.V.:100] Out: -   BLOOD ADMINISTERED:none  DRAINS: none   LOCAL MEDICATIONS USED:  NONE  SPECIMEN:  No Specimen  DISPOSITION OF SPECIMEN:  N/A  COUNTS:  YES  TOURNIQUET:  * No tourniquets in log *  DICTATION: .Other Dictation: Dictation Number [id number]  PLAN OF CARE: Discharge to home after PACU  PATIENT DISPOSITION:  PACU - hemodynamically stable.   Delay start of Pharmacological VTE agent (>24hrs) due to surgical blood loss or risk of bleeding: no

## 2012-03-16 NOTE — Discharge Instructions (Signed)
Post Anesthesia Home Care Instructions  Activity: Get plenty of rest for the remainder of the day. A responsible adult should stay with you for 24 hours following the procedure.  For the next 24 hours, DO NOT: -Drive a car -Advertising copywriter -Drink alcoholic beverages -Take any medication unless instructed by your physician -Make any legal decisions or sign important papers.  Meals: Start with liquid foods such as gelatin or soup. Progress to regular foods as tolerated. Avoid greasy, spicy, heavy foods. If nausea and/or vomiting occur, drink only clear liquids until the nausea and/or vomiting subsides. Call your physician if vomiting continues.  Special Instructions/Symptoms: Your throat may feel dry or sore from the anesthesia or the breathing tube placed in your throat during surgery. If this causes discomfort, gargle with warm salt water. The discomfort should disappear within 24 hours.  Call your surgeon if you experience:   1.  Fever over 101.0. 2.  Inability to urinate. 3.  Nausea and/or vomiting. 4.  Extreme swelling or bruising at the surgical site. 5.  Continued bleeding from the incision. 6.  Increased pain, redness or drainage from the incision. 7.  Problems related to your pain medication.   Regional Anesthesia Blocks  1. Numbness or the inability to move the "blocked" extremity may last from 3-48 hours after placement. The length of time depends on the medication injected and your individual response to the medication. If the numbness is not going away after 48 hours, call your surgeon.  2. The extremity that is blocked will need to be protected until the numbness is gone and the  Strength has returned. Because you cannot feel it, you will need to take extra care to avoid injury. Because it may be weak, you may have difficulty moving it or using it. You may not know what position it is in without looking at it while the block is in effect.  3. For blocks in the legs  and feet, returning to weight bearing and walking needs to be done carefully. You will need to wait until the numbness is entirely gone and the strength has returned. You should be able to move your leg and foot normally before you try and bear weight or walk. You will need someone to be with you when you first try to ensure you do not fall and possibly risk injury.  4. Bruising and tenderness at the needle site are common side effects and will resolve in a few days.  5. Persistent numbness or new problems with movement should be communicated to the surgeon or the [facility] [phone] [facility] [phone]). Regional Anesthesia Blocks  1. Numbness or the inability to move the "blocked" extremity may last from 3-48 hours after placement. The length of time depends on the medication injected and your individual response to the medication. If the numbness is not going away after 48 hours, call your surgeon.  2. The extremity that is blocked will need to be protected until the numbness is gone and the  Strength has returned. Because you cannot feel it, you will need to take extra care to avoid injury. Because it may be weak, you may have difficulty moving it or using it. You may not know what position it is in without looking at it while the block is in effect.  3. For blocks in the legs and feet, returning to weight bearing and walking needs to be done carefully. You will need to wait until the numbness  is entirely gone and the strength has returned. You should be able to move your leg and foot normally before you try and bear weight or walk. You will need someone to be with you when you first try to ensure you do not fall and possibly risk injury.  4. Bruising and tenderness at the needle site are common side effects and will resolve in a few days.  5. Persistent numbness or new problems with movement should be communicated to the surgeon or the [facility] [phone] [facility] [phone]).

## 2012-03-16 NOTE — Anesthesia Preprocedure Evaluation (Signed)
Anesthesia Evaluation  Patient identified by MRN, date of birth, ID band Patient awake    Reviewed: Allergy & Precautions, H&P , NPO status , Patient's Chart, lab work & pertinent test results  Airway Mallampati: II TM Distance: <3 FB Neck ROM: Full    Dental   Pulmonary sleep apnea ,    Pulmonary exam normal       Cardiovascular hypertension, Rhythm:Regular Rate:Tachycardia     Neuro/Psych Anxiety Depression    GI/Hepatic GERD-  ,(+)     substance abuse  cocaine use,   Endo/Other  Morbid obesity  Renal/GU      Musculoskeletal   Abdominal (+) + obese,   Peds  Hematology   Anesthesia Other Findings   Reproductive/Obstetrics                           Anesthesia Physical Anesthesia Plan  ASA: III  Anesthesia Plan: General   Post-op Pain Management:    Induction: Intravenous  Airway Management Planned: LMA  Additional Equipment:   Intra-op Plan:   Post-operative Plan: Extubation in OR  Informed Consent: I have reviewed the patients History and Physical, chart, labs and discussed the procedure including the risks, benefits and alternatives for the proposed anesthesia with the patient or authorized representative who has indicated his/her understanding and acceptance.     Plan Discussed with: CRNA and Surgeon  Anesthesia Plan Comments:         Anesthesia Quick Evaluation

## 2012-03-16 NOTE — Anesthesia Procedure Notes (Addendum)
Anesthesia Regional Block:  Popliteal block  Pre-Anesthetic Checklist: ,, timeout performed, Correct Patient, Correct Site, Correct Laterality, Correct Procedure, Correct Position, site marked, Risks and benefits discussed,  Surgical consent,  Pre-op evaluation,  At surgeon's request and post-op pain management  Laterality: Right  Prep: chloraprep       Needles:  Injection technique: Single-shot  Needle Type: Stimiplex     Needle Length: 15cm 15 cm   Needle insertion depth: 14 cm   Additional Needles:  Procedures: nerve stimulator Popliteal block  Nerve Stimulator or Paresthesia:  Response: 0.5 mA,   Additional Responses:   Narrative:  Start time: 03/16/2012 2:18 PM End time: 03/16/2012 2:30 PM Injection made incrementally with aspirations every 5 mL. Anesthesiologist: Dr Gypsy Balsam  Additional Notes: [id number] R Popliteal Nerve Block POP CHG prep, sterile tech #21g 15cm stim needle w/stim down to .48ma Multiple neg asp Marc .5% w/epi 1:200000 total 50cc+ decadron 4mg  infiltrated No compl Dr Gypsy Balsam   Procedure Name: LMA Insertion Date/Time: 03/16/2012 2:47 PM Performed by: Burna Cash Pre-anesthesia Checklist: Patient identified, Emergency Drugs available, Suction available and Patient being monitored Patient Re-evaluated:Patient Re-evaluated prior to inductionOxygen Delivery Method: Circle System Utilized Preoxygenation: Pre-oxygenation with 100% oxygen Intubation Type: IV induction Ventilation: Mask ventilation without difficulty LMA: LMA with gastric port inserted LMA Size: 4.0 Number of attempts: 1 Placement Confirmation: positive ETCO2 Tube secured with: Tape Dental Injury: Teeth and Oropharynx as per pre-operative assessment

## 2012-03-17 NOTE — Op Note (Signed)
NAMEMarland [name]  [name], [name] NO.:  [id number]  MEDICAL RECORD NO.:  [id number]  LOCATION:                                 FACILITY:  PHYSICIAN:  Leonides Grills, M.D.     DATE OF BIRTH:  23-Jul-1971  DATE OF PROCEDURE:  03/16/2012 DATE OF DISCHARGE:                              OPERATIVE REPORT   PREOPERATIVE DIAGNOSES: 1. Closed right chronic lateral malleolus fracture. 2. Obesity.  POSTOPERATIVE DIAGNOSIS:  Closed right chronic lateral malleolus fracture.  OPERATION: 1. Open reduction and internal fixation of right lateral malleolus     fracture. 2. Stress x-rays, right ankle.  ANESTHESIA:  General.  SURGEON:  Leonides Grills, MD  ASSISTANT:  Richardean Canal, PA-C  ESTIMATED BLOOD LOSS:  Minimal.  TOURNIQUET:  None.  COMPLICATIONS:  None.  DISPOSITION:  Stable to PR.  INDICATION:  This is a 41 year old female who sustained a right lateral malleolus fracture.  She had medial deltoid tenderness at followup x-ray at approximately 2 weeks postop.  She had lateral subluxation of the talus with the ankle mortise.  After long discussion with the patient, we elected to proceed with an open reduction and internal fixation. However, during the time period after this, she required preoperative clearance and a sleep study.  This delayed her posting.  Once we had all this done, we were then able to proceed with the above procedure and all risks, which include infection, nerve or vessel injury, wound healing problems, persistent pain, worsening pain, prolonged recovery, stiffness, arthritis, nonunion, malunion, hardware irritation, hardware failure, DVT, PE, and possible intra-articular osteochondral lesion, scar tissue and arthroscopy were all explained.  Questions were encouraged and answered.  OPERATION:  The patient was brought to the operating room and placed in a supine position after adequate general endotracheal tube anesthesia was administered as well as Ancef  1 g IV piggyback.  Bumps were placed on the right ipsilateral hip, internally rotating the right lower extremity.  All bony prominences were well padded.  The right lower extremity was then prepped and draped in a sterile manner.  Due to the patient's obesity, we elected not to apply a tourniquet.  Her obesity also made the entire procedure more difficult due to bleeding exposure. Once the right lower extremity was prepped and draped in a sterile manner, a longitudinal incision full thickness down to bone was then made over the right lateral malleolus.  Dissection was carried down and direct to bone.  Fracture site was then encountered.  The fracture site was not healed.  We were able to free up the fracture site with both a Freer, osteotome and a small lamina spreader.  Once this was done, we then anatomically reduced the fracture with a two-point reduction clamp. We then obtained x-rays to verify, this was anatomically reduced.  We then placed a 4-hole one-third tubular plate in an antiglide configuration.  We then sequentially placed two 3.5-mm fully-threaded cortical set screws proximal to the fracture site.  We then placed an interfragmentary screw through the plate across the fracture site.  We then placed one more distal screw above the plafond across again the lateral malleolus.  We then obtained stress x-rays in the AP, mortise  and lateral views, which showed no gross motion, fixation, proposition, and excellent alignment as well.  No lateral subluxation of the talus with ankle mortise or diastasis of the distal tib-fib syndesmosis.  The area was copiously irrigated with normal saline.  Subcu was closed with 3-0 Vicryl, skin was closed with 4-0 nylon over the wounds.  Sterile dressing was applied.  Modified Jones dressing was applied with the ankle in neutral dorsiflexion.  The patient was stable to PR.     Leonides Grills, M.D.     PB/MEDQ  D:  03/16/2012  T:  03/16/2012   Job:  [id number]

## 2012-03-18 ENCOUNTER — Encounter (HOSPITAL_BASED_OUTPATIENT_CLINIC_OR_DEPARTMENT_OTHER): Payer: Self-pay | Admitting: Orthopedic Surgery

## 2012-07-03 ENCOUNTER — Encounter (HOSPITAL_COMMUNITY): Payer: Self-pay

## 2012-07-03 ENCOUNTER — Other Ambulatory Visit (HOSPITAL_COMMUNITY): Payer: Medicare Other

## 2012-07-03 ENCOUNTER — Emergency Department (HOSPITAL_COMMUNITY)
Admission: EM | Admit: 2012-07-03 | Discharge: 2012-07-03 | Disposition: A | Discharge status: Home / Self Care | Payer: Medicare Other | Attending: Emergency Medicine | Admitting: Emergency Medicine

## 2012-07-03 ENCOUNTER — Emergency Department (HOSPITAL_COMMUNITY): Payer: Medicare Other

## 2012-07-03 DIAGNOSIS — M79609 Pain in unspecified limb: Secondary | ICD-10-CM

## 2012-07-03 DIAGNOSIS — Z79899 Other long term (current) drug therapy: Secondary | ICD-10-CM | POA: Insufficient documentation

## 2012-07-03 DIAGNOSIS — R0789 Other chest pain: Secondary | ICD-10-CM | POA: Insufficient documentation

## 2012-07-03 DIAGNOSIS — M542 Cervicalgia: Secondary | ICD-10-CM | POA: Insufficient documentation

## 2012-07-03 DIAGNOSIS — K219 Gastro-esophageal reflux disease without esophagitis: Secondary | ICD-10-CM | POA: Insufficient documentation

## 2012-07-03 DIAGNOSIS — G4730 Sleep apnea, unspecified: Secondary | ICD-10-CM | POA: Insufficient documentation

## 2012-07-03 DIAGNOSIS — F319 Bipolar disorder, unspecified: Secondary | ICD-10-CM | POA: Insufficient documentation

## 2012-07-03 LAB — RAPID URINE DRUG SCREEN, HOSP PERFORMED
Amphetamines: NOT DETECTED
Barbiturates: NOT DETECTED
Benzodiazepines: NOT DETECTED
Cocaine: NOT DETECTED
Opiates: NOT DETECTED
Tetrahydrocannabinol: NOT DETECTED

## 2012-07-03 LAB — BASIC METABOLIC PANEL
BUN: 12 mg/dL (ref 6–23)
CO2: 28 mEq/L (ref 19–32)
Calcium: 8.9 mg/dL (ref 8.4–10.5)
Chloride: 103 mEq/L (ref 96–112)
Creatinine, Ser: 0.72 mg/dL (ref 0.50–1.10)
GFR calc Af Amer: >90 mL/min (ref >90)
GFR calc non Af Amer: >90 mL/min (ref >90)
Glucose, Bld: 90 mg/dL (ref 70–99)
Potassium: 4.6 mEq/L (ref 3.5–5.1)
Sodium: 137 mEq/L (ref 135–145)

## 2012-07-03 LAB — URINALYSIS, ROUTINE W REFLEX MICROSCOPIC
Bilirubin Urine: NEGATIVE
Glucose, UA: NEGATIVE mg/dL
Hgb urine dipstick: NEGATIVE
Ketones, ur: NEGATIVE mg/dL
Nitrite: POSITIVE — AB
Protein, ur: NEGATIVE mg/dL
Specific Gravity, Urine: 1.022 (ref 1.005–1.030)
Urobilinogen, UA: 2 mg/dL — ABNORMAL HIGH (ref 0.0–1.0)
pH: 7 (ref 5.0–8.0)

## 2012-07-03 LAB — CBC WITH DIFFERENTIAL/PLATELET
Basophils Absolute: 0 10*3/uL (ref 0.0–0.1)
Basophils Relative: 1 % (ref 0–1)
Eosinophils Absolute: 0.1 10*3/uL (ref 0.0–0.7)
Eosinophils Relative: 1 % (ref 0–5)
HCT: 32.9 % — ABNORMAL LOW (ref 36.0–46.0)
Hemoglobin: 11 g/dL — ABNORMAL LOW (ref 12.0–15.0)
Lymphocytes Relative: 46 % (ref 12–46)
Lymphs Abs: 3.6 10*3/uL (ref 0.7–4.0)
MCH: 28.9 pg (ref 26.0–34.0)
MCHC: 33.4 g/dL (ref 30.0–36.0)
MCV: 86.4 fL (ref 78.0–100.0)
Monocytes Absolute: 0.8 10*3/uL (ref 0.1–1.0)
Monocytes Relative: 11 % (ref 3–12)
Neutro Abs: 3.3 10*3/uL (ref 1.7–7.7)
Neutrophils Relative %: 42 % — ABNORMAL LOW (ref 43–77)
Platelets: 336 10*3/uL (ref 150–400)
RBC: 3.81 MIL/uL — ABNORMAL LOW (ref 3.87–5.11)
RDW: 14.2 % (ref 11.5–15.5)
WBC: 7.7 10*3/uL (ref 4.0–10.5)

## 2012-07-03 LAB — POCT I-STAT TROPONIN I: Troponin i, poc: 0 ng/mL (ref 0.00–0.08)

## 2012-07-03 LAB — URINE MICROSCOPIC-ADD ON

## 2012-07-03 LAB — D-DIMER, QUANTITATIVE: D-Dimer, Quant: 1 ug/mL-FEU — ABNORMAL HIGH (ref 0.00–0.48)

## 2012-07-03 MED ORDER — DEXTROSE 5 % IV SOLN
1.0000 g | INTRAVENOUS | Status: DC
  Administered 2012-07-03: 1 g via INTRAVENOUS
  Filled 2012-07-03: qty 10

## 2012-07-03 MED ORDER — CEPHALEXIN 500 MG PO CAPS: 500.0000 mg | ORAL_CAPSULE | Freq: Four times a day (QID) | ORAL | Status: AC

## 2012-07-03 NOTE — Progress Notes (Signed)
VASCULAR LAB PRELIMINARY  PRELIMINARY  PRELIMINARY  PRELIMINARY  Right lower extremity venous Doppler completed.    Preliminary report:  No obvious evidence of DVT or SVT noted in the right lower extremity.  Hali Balgobin, 07/03/2012, 6:02 PM

## 2012-07-03 NOTE — ED Provider Notes (Signed)
History     CSN: [id number]  Arrival date & time 07/03/12  1324   First MD Initiated Contact with Patient 07/03/12 1514      Chief Complaint  Patient presents with  . Chest Pain    (Consider location/radiation/quality/duration/timing/severity/associated sxs/prior treatment) Patient is a 41 y.o. female presenting with chest pain. The history is provided by the patient and a friend. No language interpreter was used.  Chest Pain The chest pain began 2 days ago. Chest pain occurs constantly. The chest pain is improving. At its most intense, the pain is at 8/10. The pain is currently at 6/10. The severity of the pain is moderate. The quality of the pain is described as heavy and sharp. The pain radiates to the left neck and left shoulder. Chest pain is worsened by certain positions. Primary symptoms include shortness of breath and dizziness. Pertinent negatives for primary symptoms include no fever, no nausea and no vomiting.  Dizziness does not occur with nausea, vomiting, weakness or diaphoresis.  Associated symptoms include near-syncope.  Pertinent negatives for associated symptoms include no diaphoresis and no weakness. She tried aspirin for the symptoms. Risk factors include obesity and sedentary lifestyle.  Her past medical history is significant for sleep apnea.  Pertinent negatives for past medical history include no aneurysm, no anxiety/panic attacks, no aortic aneurysm, no aortic dissection, no arrhythmia, no CAD, no cancer, no COPD, no CHF, no diabetes, no DVT, no MI, no PE, no PVD, no seizures, no strokes and no thyroid problem.  Her family medical history is significant for CAD in family, diabetes in family, heart disease in family, hyperlipidemia in family, hypertension in family, PE in family and stroke in family.  Pertinent negatives for family medical history include: no early MI in family and no sudden death in family.  Procedure history is negative for cardiac catheterization,  echocardiogram, persantine thallium, stress echo, stress thallium and exercise treadmill test.    41 year old female coming in with complaint of left chest pain that has been intermittent for 2 days but currently it is constant to the left chest. States that she's also having fleeting sharp pains in her left chest that lasted approximately one second. States that the fleeting sharp pains are worse with movement. States that pain radiates to her left neck and left shoulder. States that she has used a muscle rub and it has made it better. Also she had a dizzy spell and fell onto the door this morning. Had right ankle surgery 3 months ago with right calf pain. Past medical history of PEs and her family sister and mother. States that she's short of breath with exertion. O2 sats 100% on r/a.  PCP is Dr. Ree Edman at [facility]. She took 325 mg of aspirin prior to arrival. Poor historian past medical history of high cholesterol, bipolar, sleep apnea, GERD. States that she has been wearing a stocking on her right lower extremity but is too tight she does not wear it. States that she has been having some acid reflux as well. States that her weight is 254 pounds.   Past Medical History  Diagnosis Date  . GERD (gastroesophageal reflux disease)   . Sleep apnea     states had test-no cpap ordered-does snore  . Bipolar 1 disorder   . 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 Fracture surgery     rt femer/knee-plate-screws-  . Orif ankle  fracture 03/16/2012    Procedure: OPEN REDUCTION INTERNAL FIXATION (ORIF) ANKLE FRACTURE;  Surgeon: Sherri Rad, MD;  Location: [facility];  Service: Orthopedics;  Laterality: Right;  right lateral malleolus fracture    History reviewed. No pertinent family history.  History  Substance Use Topics  . Smoking status: Never Smoker   . Smokeless tobacco: Not on file  . Alcohol Use: Yes      occ    OB History    Grav Para Term Preterm Abortions TAB SAB Ect Mult Living                  Review of Systems  Constitutional: Negative.  Negative for fever, chills and diaphoresis.  HENT: Positive for neck pain.   Eyes: Negative.   Respiratory: Positive for shortness of breath.   Cardiovascular: Positive for chest pain and near-syncope. Negative for leg swelling.       R calf pain  Gastrointestinal: Negative.  Negative for nausea and vomiting.  Musculoskeletal: Negative for gait problem.  Skin:       Healed over scars to R ankle.  Neurological: Positive for dizziness. Negative for seizures and weakness.  Psychiatric/Behavioral: Negative.   All other systems reviewed and are negative.    Allergies  Percocet  Home Medications   Current Outpatient Rx  Name Route Sig Dispense Refill  . CYCLOBENZAPRINE HCL 10 MG PO TABS Oral Take 10 mg by mouth at bedtime as needed. Muscles relaxion    . DICLOFENAC SODIUM 75 MG PO TBEC Oral Take 75 mg by mouth 2 (two) times daily.    Marland Kitchen ESOMEPRAZOLE MAGNESIUM 40 MG PO CPDR Oral Take 40 mg by mouth daily before breakfast.    . IBUPROFEN 800 MG PO TABS Oral Take 800 mg by mouth every 8 (eight) hours as needed. For pain    . VITAMIN C 500 MG PO TABS Oral Take 1 tablet (500 mg total) by mouth daily. 90 tablet 0    BP 122/77  Pulse 80  Temp 98.4 F (36.9 C) (Oral)  Resp 18  SpO2 100%  Physical Exam  Nursing note and vitals reviewed. Constitutional: She is oriented to person, place, and time. She appears well-developed and well-nourished.       Morbid obese  HENT:  Head: Normocephalic and atraumatic.  Eyes: Conjunctivae normal and EOM are normal. Pupils are equal, round, and reactive to light.  Neck: Normal range of motion. Neck supple.  Cardiovascular: Normal rate.   Pulmonary/Chest: Effort normal and breath sounds normal. No respiratory distress. She has no wheezes.  Abdominal: Soft. Bowel sounds are normal. She exhibits no  distension. There is no tenderness.  Musculoskeletal: Normal range of motion. She exhibits no edema and no tenderness.  Neurological: She is alert and oriented to person, place, and time. She has normal reflexes.  Skin: Skin is warm and dry.  Psychiatric: She has a normal mood and affect.    ED Course  Procedures (including critical care time)   Difficult IV start #22 in the R forearm   Labs Reviewed  CBC WITH DIFFERENTIAL  BASIC METABOLIC PANEL  URINALYSIS, ROUTINE W REFLEX MICROSCOPIC  URINE RAPID DRUG SCREEN (HOSP PERFORMED)  D-DIMER, QUANTITATIVE   No results found.   No diagnosis found.    MDM   Date: 07/03/2012  Rate: 78  Rhythm: normal sinus rhythm  QRS Axis: normal  Intervals: normal  ST/T Wave abnormalities: normal  Conduction Disutrbances:none  Narrative Interpretation:   Old  EKG Reviewed: none available   41 year old female with morbid obesity complaining of left chest pain that's been intermittent with fleeting sharp pain that lasts seconds. Recent ankle surgery with a negative Doppler study of her right lower calf pain. D-dimer was 1.0. Patient opted not to have the CAT scan today. Patient did have a positive UTI she Gram of Rocephin in the ER for this. She will go home on Keflex by mouth. Patient will sign out AMA today and return if she has any severe sharp chest pain. Chest x-ray which was reviewed by myself was unremarkable. All other labs unremarkable. Patient is slightly anemic hemoglobin 11. White count was 7.7. Troponin was negative. She understands to followup with her PCP this week. She also understands to return to the ER for worsening chest pain shortness of breath.   Labs Reviewed  CBC WITH DIFFERENTIAL - Abnormal; Notable for the following:    RBC 3.81 (*)     Hemoglobin 11.0 (*)     HCT 32.9 (*)     Neutrophils Relative 42 (*)     All other components within normal limits  URINALYSIS, ROUTINE W REFLEX MICROSCOPIC - Abnormal; Notable for  the following:    APPearance CLOUDY (*)     Urobilinogen, UA 2.0 (*)     Nitrite POSITIVE (*)     Leukocytes, UA MODERATE (*)     All other components within normal limits  D-DIMER, QUANTITATIVE - Abnormal; Notable for the following:    D-Dimer, Quant 1.00 (*)     All other components within normal limits  URINE MICROSCOPIC-ADD ON - Abnormal; Notable for the following:    Squamous Epithelial / LPF MANY (*)     Bacteria, UA MANY (*)     All other components within normal limits  BASIC METABOLIC PANEL  URINE RAPID DRUG SCREEN (HOSP PERFORMED)  POCT I-STAT TROPONIN I           Remi Haggard, NP 07/03/12 1825

## 2012-07-03 NOTE — ED Notes (Signed)
Pt placed in room from hallway; pt undressed, in gown, on monitor, continuous pulse oximetry and blood pressure cuff; EKG performed; family at bedside

## 2012-07-03 NOTE — ED Notes (Signed)
EMS reports chest pain for 2 days off and on, moving to neck and left shoulder, pain with breathing and palpation, took ASA prior to EMS arrival

## 2012-07-03 NOTE — ED Provider Notes (Signed)
Medical screening examination/treatment/procedure(s) were performed by non-physician practitioner and as supervising physician I was immediately available for consultation/collaboration.   Loren Racer, MD 07/03/12 2222

## 2012-08-04 ENCOUNTER — Other Ambulatory Visit: Payer: Self-pay | Admitting: Internal Medicine

## 2012-08-04 DIAGNOSIS — Z1231 Encounter for screening mammogram for malignant neoplasm of breast: Secondary | ICD-10-CM

## 2012-09-06 ENCOUNTER — Ambulatory Visit
Admission: RE | Admit: 2012-09-06 | Discharge: 2012-09-06 | Disposition: A | Discharge status: Home / Self Care | Payer: Medicare Other | Source: Ambulatory Visit | Attending: Internal Medicine | Admitting: Internal Medicine

## 2012-09-06 DIAGNOSIS — Z1231 Encounter for screening mammogram for malignant neoplasm of breast: Secondary | ICD-10-CM

## 2013-04-19 ENCOUNTER — Encounter (HOSPITAL_COMMUNITY): Payer: Self-pay | Admitting: Emergency Medicine

## 2013-04-19 ENCOUNTER — Emergency Department (INDEPENDENT_AMBULATORY_CARE_PROVIDER_SITE_OTHER)
Admission: EM | Admit: 2013-04-19 | Discharge: 2013-04-19 | Disposition: A | Discharge status: Home / Self Care | Payer: Medicare Other | Source: Home / Self Care | Attending: Emergency Medicine | Admitting: Emergency Medicine

## 2013-04-19 DIAGNOSIS — M549 Dorsalgia, unspecified: Secondary | ICD-10-CM

## 2013-04-19 LAB — POCT URINALYSIS DIP (DEVICE)
Bilirubin Urine: NEGATIVE
Glucose, UA: NEGATIVE mg/dL
Hgb urine dipstick: NEGATIVE
Ketones, ur: NEGATIVE mg/dL
Nitrite: NEGATIVE
Protein, ur: NEGATIVE mg/dL
Specific Gravity, Urine: 1.02 (ref 1.005–1.030)
Urobilinogen, UA: 2 mg/dL — ABNORMAL HIGH (ref 0.0–1.0)
pH: 7 (ref 5.0–8.0)

## 2013-04-19 LAB — POCT I-STAT, CHEM 8
BUN: 9 mg/dL (ref 6–23)
Calcium, Ion: 1.12 mmol/L (ref 1.12–1.23)
Chloride: 102 mEq/L (ref 96–112)
Creatinine, Ser: 0.8 mg/dL (ref 0.50–1.10)
Glucose, Bld: 78 mg/dL (ref 70–99)
HCT: 36 % (ref 36.0–46.0)
Hemoglobin: 12.2 g/dL (ref 12.0–15.0)
Potassium: 3.7 mEq/L (ref 3.5–5.1)
Sodium: 141 mEq/L (ref 135–145)
TCO2: 26 mmol/L (ref 0–100)

## 2013-04-19 MED ORDER — HYDROCODONE-ACETAMINOPHEN 5-325 MG PO TABS: 2.0000 | ORAL_TABLET | ORAL | Status: DC | PRN

## 2013-04-19 MED ORDER — IBUPROFEN 800 MG PO TABS: 800.0000 mg | ORAL_TABLET | Freq: Three times a day (TID) | ORAL | Status: DC

## 2013-04-19 NOTE — ED Provider Notes (Signed)
Medical screening examination/treatment/procedure(s) were performed by non-physician practitioner and as supervising physician I was immediately available for consultation/collaboration.  Leslee Home, M.D.  Reuben Likes, MD 04/19/13 [phone]

## 2013-04-19 NOTE — ED Provider Notes (Signed)
History    CSN: [id number] Arrival date & time 04/19/13  1352  First MD Initiated Contact with Patient 04/19/13 1530     Chief Complaint  Patient presents with  . Back Pain   (Consider location/radiation/quality/duration/timing/severity/associated sxs/prior Treatment) Patient is a 42 y.o. female presenting with back pain. The history is provided by the patient. No language interpreter was used.  Back Pain Quality:  Aching Radiates to:  Does not radiate Pain severity:  Moderate Pain is:  Worse during the day Timing:  Constant Progression:  Worsening Chronicity:  New Relieved by:  Nothing Worsened by:  Nothing tried Ineffective treatments:  None tried Pt worried about kidney problems.   Pt reports she has several family members who have had dialysis.    Past Medical History  Diagnosis Date  . GERD (gastroesophageal reflux disease)   . Sleep apnea     states had test-no cpap ordered-does snore  . Bipolar 1 disorder   . 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 Fracture surgery      rt femer/knee-plate-screws-  . Orif ankle fracture  03/16/2012    Procedure: OPEN REDUCTION INTERNAL FIXATION (ORIF) ANKLE FRACTURE;  Surgeon: Sherri Rad, MD;  Location: [facility];  Service: Orthopedics;  Laterality: Right;  right lateral malleolus fracture   No family history on file. History  Substance Use Topics  . Smoking status: Never Smoker   . Smokeless tobacco: Not on file  . Alcohol Use: No     Comment: occ   OB History   Grav Para Term Preterm Abortions TAB SAB Ect Mult Living                 Review of Systems  Musculoskeletal: Positive for back pain.  All other systems reviewed and are negative.    Allergies  Percocet  Home Medications   Current Outpatient Rx  Name  Route  Sig  Dispense  Refill  . cyclobenzaprine (FLEXERIL) 10 MG tablet   Oral   Take 10 mg by mouth at bedtime as  needed. Muscles relaxion         . diclofenac (VOLTAREN) 75 MG EC tablet   Oral   Take 75 mg by mouth 2 (two) times daily.         Marland Kitchen esomeprazole (NEXIUM) 40 MG capsule   Oral   Take 40 mg by mouth daily before breakfast.         . ibuprofen (ADVIL,MOTRIN) 800 MG tablet   Oral   Take 800 mg by mouth every 8 (eight) hours as needed. For pain          BP 150/75  Pulse 68  Temp(Src) 98.2 F (36.8 C) (Oral)  Resp 22  SpO2 100%  LMP 03/20/2013 Physical Exam  Nursing note and vitals reviewed. Constitutional: She appears well-developed and well-nourished.  HENT:  Head: Normocephalic and atraumatic.  Right Ear: External ear normal.  Left Ear: External ear normal.  Eyes: Conjunctivae are normal. Pupils are equal, round, and reactive to light.  Neck: Normal range of motion. Neck supple.  Cardiovascular: Normal rate.   Abdominal: Soft. There is no tenderness.  Musculoskeletal: Normal range of motion.  Neurological: She is alert.  Skin: Skin is warm.  Psychiatric: She has a normal mood and affect.    ED Course  Procedures (including critical care time) Labs Reviewed  POCT URINALYSIS DIP (DEVICE) -  Abnormal; Notable for the following:    Urobilinogen, UA 2.0 (*)    Leukocytes, UA TRACE (*)    All other components within normal limits  POCT I-STAT, CHEM 8   No results found. 1. Acute back pain     MDM  Urine negative,   Istat normal.    Pt advised to follow up with her MD.    Elson Areas, PA-C 04/19/13 [phone]

## 2013-04-19 NOTE — ED Notes (Signed)
Chronic back pain.  Patient complains of back pain and right epigastric pain.  This episode of pain started one week ago.

## 2013-11-04 ENCOUNTER — Encounter (HOSPITAL_COMMUNITY): Payer: Self-pay | Admitting: Emergency Medicine

## 2013-11-04 ENCOUNTER — Emergency Department (HOSPITAL_COMMUNITY): Payer: Medicare Other

## 2013-11-04 ENCOUNTER — Emergency Department (HOSPITAL_COMMUNITY)
Admission: EM | Admit: 2013-11-04 | Discharge: 2013-11-04 | Disposition: A | Discharge status: Home / Self Care | Payer: Medicare Other | Attending: Emergency Medicine | Admitting: Emergency Medicine

## 2013-11-04 DIAGNOSIS — Z885 Allergy status to narcotic agent status: Secondary | ICD-10-CM | POA: Insufficient documentation

## 2013-11-04 DIAGNOSIS — G43909 Migraine, unspecified, not intractable, without status migrainosus: Secondary | ICD-10-CM | POA: Insufficient documentation

## 2013-11-04 DIAGNOSIS — S39012A Strain of muscle, fascia and tendon of lower back, initial encounter: Secondary | ICD-10-CM

## 2013-11-04 DIAGNOSIS — S7001XA Contusion of right hip, initial encounter: Secondary | ICD-10-CM

## 2013-11-04 DIAGNOSIS — S7000XA Contusion of unspecified hip, initial encounter: Secondary | ICD-10-CM | POA: Insufficient documentation

## 2013-11-04 DIAGNOSIS — Y9301 Activity, walking, marching and hiking: Secondary | ICD-10-CM | POA: Insufficient documentation

## 2013-11-04 DIAGNOSIS — S8002XA Contusion of left knee, initial encounter: Secondary | ICD-10-CM

## 2013-11-04 DIAGNOSIS — S335XXA Sprain of ligaments of lumbar spine, initial encounter: Secondary | ICD-10-CM | POA: Insufficient documentation

## 2013-11-04 DIAGNOSIS — S8000XA Contusion of unspecified knee, initial encounter: Secondary | ICD-10-CM | POA: Insufficient documentation

## 2013-11-04 DIAGNOSIS — F1911 Other psychoactive substance abuse, in remission: Secondary | ICD-10-CM | POA: Insufficient documentation

## 2013-11-04 DIAGNOSIS — Y9229 Other specified public building as the place of occurrence of the external cause: Secondary | ICD-10-CM | POA: Insufficient documentation

## 2013-11-04 DIAGNOSIS — M129 Arthropathy, unspecified: Secondary | ICD-10-CM | POA: Insufficient documentation

## 2013-11-04 DIAGNOSIS — R209 Unspecified disturbances of skin sensation: Secondary | ICD-10-CM | POA: Insufficient documentation

## 2013-11-04 DIAGNOSIS — W010XXA Fall on same level from slipping, tripping and stumbling without subsequent striking against object, initial encounter: Secondary | ICD-10-CM | POA: Insufficient documentation

## 2013-11-04 HISTORY — DX: Unspecified osteoarthritis, unspecified site: M19.90

## 2013-11-04 HISTORY — DX: Migraine, unspecified, not intractable, without status migrainosus: G43.909

## 2013-11-04 MED ORDER — IBUPROFEN 800 MG PO TABS: 800.0000 mg | ORAL_TABLET | Freq: Three times a day (TID) | ORAL | Status: DC

## 2013-11-04 MED ORDER — ONDANSETRON 4 MG PO TBDP
8.0000 mg | ORAL_TABLET | Freq: Once | ORAL | Status: AC
  Administered 2013-11-04: 8 mg via ORAL
  Filled 2013-11-04: qty 2

## 2013-11-04 MED ORDER — HYDROCODONE-ACETAMINOPHEN 5-325 MG PO TABS
1.0000 | ORAL_TABLET | Freq: Once | ORAL | Status: AC
  Administered 2013-11-04: 1 via ORAL
  Filled 2013-11-04: qty 1

## 2013-11-04 MED ORDER — CYCLOBENZAPRINE HCL 10 MG PO TABS: 10.0000 mg | ORAL_TABLET | Freq: Two times a day (BID) | ORAL | Status: DC | PRN

## 2013-11-04 NOTE — ED Notes (Signed)
Pt changed into gown for xray. Given crackers to prevent nausea from meds as pts reports is hx.

## 2013-11-04 NOTE — ED Provider Notes (Signed)
CSN: [id number]     Arrival date & time 11/04/13  0865 History  This chart was scribed for non-physician practitioner working with Janice Norrie, MD by Stacy Gardner, ED scribe. This patient was seen in room TR06C/TR06C and the patient's care was started at 10:05 AM.  None    No chief complaint on file.  (Consider location/radiation/quality/duration/timing/severity/associated sxs/prior Treatment) The history is provided by the patient and medical records. No language interpreter was used.   HPI Comments: Danielle [name] is a 43 y.o. female who presents to the Emergency Department complaining of fall that occurred moments PTA.  She was walking out of [place] when she tripped over a rug. She is unsure of how she landed. Pt is able to walk after the accident and to bear weight on her lower extremites. Pt is experiencing right sided pain that is gradually worsening.  Pt states having constant, mild pain to her left knee. She describes the pain as "stiffness" with a tight sensation. In addition she is experiencing constant, mild back and neck pain. Denies numbness and tingling. Nothing seems to resolve her symptoms. Pt has a past medical hx of GERD and sleep apnea. She has also has a past mental medical hx of bipolar 1 disorder and remission from drug abuse.    Past Medical History  Diagnosis Date  . GERD (gastroesophageal reflux disease)   . Sleep apnea     states had test-no cpap ordered-does snore  . Bipolar 1 disorder   . 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 Fracture surgery      rt femer/knee-plate-screws-  . Orif ankle fracture  03/16/2012    Procedure: OPEN REDUCTION INTERNAL FIXATION (ORIF) ANKLE FRACTURE;  Surgeon: Colin Rhein, MD;  Location: [place] [place];  Service: Orthopedics;  Laterality: Right;  right lateral malleolus fracture   No family history on file. History  Substance Use  Topics  . Smoking status: Never Smoker   . Smokeless tobacco: Not on file  . Alcohol Use: No     Comment: occ   OB History   Grav Para Term Preterm Abortions TAB SAB Ect Mult Living                 Review of Systems  Musculoskeletal: Positive for arthralgias, back pain, myalgias and neck pain. Negative for gait problem.  Neurological: Negative for weakness and numbness.  All other systems reviewed and are negative.    Allergies  Percocet  Home Medications   Current Outpatient Rx  Name  Route  Sig  Dispense  Refill  . cyclobenzaprine (FLEXERIL) 10 MG tablet   Oral   Take 10 mg by mouth at bedtime as needed. Muscles relaxion         . diclofenac (VOLTAREN) 75 MG EC tablet   Oral   Take 75 mg by mouth 2 (two) times daily.         Marland Kitchen esomeprazole (NEXIUM) 40 MG capsule   Oral   Take 40 mg by mouth daily before breakfast.         . HYDROcodone-acetaminophen (NORCO/VICODIN) 5-325 MG per tablet   Oral   Take 2 tablets by mouth every 4 (four) hours as needed for pain.   20 tablet   0   . ibuprofen (ADVIL,MOTRIN) 800 MG tablet   Oral   Take 800 mg by mouth every 8 (eight) hours  as needed. For pain         . ibuprofen (ADVIL,MOTRIN) 800 MG tablet   Oral   Take 1 tablet (800 mg total) by mouth 3 (three) times daily.   21 tablet   0    BP 163/77  Pulse 78  Temp(Src) 97.5 F (36.4 C) (Oral)  Resp 24  Ht 5\' 4"  (1.626 m)  Wt 370 lb (167.831 kg)  BMI 63.48 kg/m2  SpO2 98%  LMP 10/21/2013 Physical Exam  Nursing note and vitals reviewed. Constitutional: She is oriented to person, place, and time. She appears well-developed and well-nourished. No distress.  HENT:  Head: Normocephalic and atraumatic.  Eyes: EOM are normal. Pupils are equal, round, and reactive to light.  Neck: Normal range of motion. Neck supple. Muscular tenderness present. No spinous process tenderness present. No tracheal deviation present.    Cardiovascular: Normal rate.    Pulmonary/Chest: Effort normal. No respiratory distress.  Abdominal: Soft. She exhibits no distension.  Musculoskeletal: Normal range of motion. She exhibits tenderness. She exhibits no edema.       Right hip: She exhibits tenderness. She exhibits normal range of motion and normal strength.       Left knee: She exhibits normal range of motion, no swelling and no effusion. Tenderness found. Medial joint line and lateral joint line tenderness noted.       Thoracic back: She exhibits normal range of motion, no tenderness and no bony tenderness.       Lumbar back: She exhibits tenderness and bony tenderness. She exhibits normal range of motion.       Legs: Neurological: She is alert and oriented to person, place, and time.  Skin: Skin is warm and dry.  Psychiatric: She has a normal mood and affect. Her behavior is normal.    ED Course  Procedures (including critical care time) DIAGNOSTIC STUDIES: Oxygen Saturation is 98% on room air, normal by my interpretation.    COORDINATION OF CARE:  10:09 AM Discussed course of care with pt which includes pain medications, x-ray to her right hip, left knee and lumbar spine . Pt understands and agrees.    Labs Review Labs Reviewed - No data to display Imaging Review Dg Lumbar Spine Complete  11/04/2013   CLINICAL DATA:  For history of fall complaining of pain in the low back.  EXAM: LUMBAR SPINE - COMPLETE 4+ VIEW  COMPARISON:  No priors.  FINDINGS: Five views of the lumbar spine demonstrate no definite acute displaced fracture or compression type fracture. Alignment is anatomic. Multilevel degenerative disc disease, most severe in the lower thoracic spine, particularly at T11-T12. Multilevel facet arthropathy. No defects of the pars interarticularis are noted.  IMPRESSION: 1. No acute radiographic abnormality of the lumbar spine. 2. Multilevel degenerative disc disease and thoracolumbar spondylosis.   Electronically Signed   By: Vinnie Langton M.D.    On: 11/04/2013 11:14   Dg Knee Complete 4 Views Left  11/04/2013   CLINICAL DATA:  History of recent fall.  Pain in the left knee.  EXAM: LEFT KNEE - COMPLETE 4+ VIEW  COMPARISON:  No priors.  FINDINGS: Four views of the left knee demonstrate no acute displaced fracture, subluxation or dislocation. There is joint space narrowing, subchondral sclerosis, subchondral cyst formation and osteophyte formation in a tricompartmental distribution, most severe in the medial and patellofemoral compartments, compatible with osteoarthritis. A small enthesophyte is seen extending off the superior aspect of the patella.  IMPRESSION: 1. No acute radiographic abnormality  of the left knee. 2. Moderate tricompartmental osteoarthritis most severe in the medial and patellofemoral compartments.   Electronically Signed   By: Vinnie Langton M.D.   On: 11/04/2013 11:10    EKG Interpretation   None       MDM  Lumbar strain Right hip contusion Left knee contusion  Patient is here s/p mechanical fall while at [facility].  X-rays negative at this time.  No alarming signs to suggest cauda equina.  Vomited the norco so the patient is requesting ibuprofen for pain.  I personally performed the services described in this documentation, which was scribed in my presence. The recorded information has been reviewed and is accurate.   Idalia Needle Joelyn Oms, PA-C 11/04/13 1159

## 2013-11-04 NOTE — Discharge Instructions (Signed)
Back Pain, Adult Low back pain is very common. About 1 in 5 people have back pain.The cause of low back pain is rarely dangerous. The pain often gets better over time.About half of people with a sudden onset of back pain feel better in just 2 weeks. About 8 in 10 people feel better by 6 weeks.  CAUSES Some common causes of back pain include:  Strain of the muscles or ligaments supporting the spine.  Wear and tear (degeneration) of the spinal discs.  Arthritis.  Direct injury to the back. DIAGNOSIS Most of the time, the direct cause of low back pain is not known.However, back pain can be treated effectively even when the exact cause of the pain is unknown.Answering your caregiver's questions about your overall health and symptoms is one of the most accurate ways to make sure the cause of your pain is not dangerous. If your caregiver needs more information, he or she may order lab work or imaging tests (X-rays or MRIs).However, even if imaging tests show changes in your back, this usually does not require surgery. HOME CARE INSTRUCTIONS For many people, back pain returns.Since low back pain is rarely dangerous, it is often a condition that people can learn to [facility] their own.   Remain active. It is stressful on the back to sit or stand in one place. Do not sit, drive, or stand in one place for more than 30 minutes at a time. Take short walks on level surfaces as soon as pain allows.Try to increase the length of time you walk each day.  Do not stay in bed.Resting more than 1 or 2 days can delay your recovery.  Do not avoid exercise or work.Your body is made to move.It is not dangerous to be active, even though your back may hurt.Your back will likely heal faster if you return to being active before your pain is gone.  Pay attention to your body when you bend and lift. Many people have less discomfortwhen lifting if they bend their knees, keep the load close to their bodies,and  avoid twisting. Often, the most comfortable positions are those that put less stress on your recovering back.  Find a comfortable position to sleep. Use a firm mattress and lie on your side with your knees slightly bent. If you lie on your back, put a pillow under your knees.  Only take over-the-counter or prescription medicines as directed by your caregiver. Over-the-counter medicines to reduce pain and inflammation are often the most helpful.Your caregiver may prescribe muscle relaxant drugs.These medicines help dull your pain so you can more quickly return to your normal activities and healthy exercise.  Put ice on the injured area.  Put ice in a plastic bag.  Place a towel between your skin and the bag.  Leave the ice on for 15-20 minutes, 03-04 times a day for the first 2 to 3 days. After that, ice and heat may be alternated to reduce pain and spasms.  Ask your caregiver about trying back exercises and gentle massage. This may be of some benefit.  Avoid feeling anxious or stressed.Stress increases muscle tension and can worsen back pain.It is important to recognize when you are anxious or stressed and learn ways to manage it.Exercise is a great option. SEEK MEDICAL CARE IF:  You have pain that is not relieved with rest or medicine.  You have pain that does not improve in 1 week.  You have new symptoms.  You are generally not feeling well. SEEK  IMMEDIATE MEDICAL CARE IF:  °· You have pain that radiates from your back into your legs. °· You develop new bowel or bladder control problems. °· You have unusual weakness or numbness in your arms or legs. °· You develop nausea or vomiting. °· You develop abdominal pain. °· You feel faint. °Document Released: 10/05/2005 Document Revised: 04/05/2012 Document Reviewed: 02/23/2011 °ExitCare® Patient Information ©2014 [employer]. ° °Contusion °A contusion is a deep bruise. Contusions are the result of an injury that caused bleeding under the  skin. The contusion may turn blue, purple, or yellow. Minor injuries will give you a painless contusion, but more severe contusions may stay painful and swollen for a few weeks.  °CAUSES  °A contusion is usually caused by a blow, trauma, or direct force to an area of the body. °SYMPTOMS  °· Swelling and redness of the injured area. °· Bruising of the injured area. °· Tenderness and soreness of the injured area. °· Pain. °DIAGNOSIS  °The diagnosis can be made by taking a history and physical exam. An X-ray, CT scan, or MRI may be needed to determine if there were any associated injuries, such as fractures. °TREATMENT  °Specific treatment will depend on what area of the body was injured. In general, the best treatment for a contusion is resting, icing, elevating, and applying cold compresses to the injured area. Over-the-counter medicines may also be recommended for pain control. Ask your caregiver what the best treatment is for your contusion. °HOME CARE INSTRUCTIONS  °· Put ice on the injured area. °· Put ice in a plastic bag. °· Place a towel between your skin and the bag. °· Leave the ice on for 15-20 minutes, 03-04 times a day. °· Only take over-the-counter or prescription medicines for pain, discomfort, or fever as directed by your caregiver. Your caregiver may recommend avoiding anti-inflammatory medicines (aspirin, ibuprofen, and naproxen) for 48 hours because these medicines may increase bruising. °· Rest the injured area. °· If possible, elevate the injured area to reduce swelling. °SEEK IMMEDIATE MEDICAL CARE IF:  °· You have increased bruising or swelling. °· You have pain that is getting worse. °· Your swelling or pain is not relieved with medicines. °MAKE SURE YOU:  °· Understand these instructions. °· Will watch your condition. °· Will get help right away if you are not doing well or get worse. °Document Released: 07/15/2005 Document Revised: 12/28/2011 Document Reviewed: 08/10/2011 °ExitCare® Patient  Information ©2014 [employer]. ° °

## 2013-11-04 NOTE — ED Notes (Signed)
Pt had episode of emesis after pain medication.

## 2013-11-04 NOTE — ED Notes (Signed)
Pt returned from xray

## 2013-11-04 NOTE — ED Notes (Signed)
Pt reporting fell at [place] this morning. Tripped over the rug, landed on her right hip, and left knee. Pt is ambulatory. No obvious injury or laceration noted. Pt is a x 4.

## 2013-11-04 NOTE — ED Provider Notes (Signed)
Medical screening examination/treatment/procedure(s) were performed by non-physician practitioner and as supervising physician I was immediately available for consultation/collaboration.  EKG Interpretation   None       Rolland Porter, MD, Abram Sander   Janice Norrie, MD 11/04/13 [phone]

## 2013-11-11 DIAGNOSIS — F319 Bipolar disorder, unspecified: Secondary | ICD-10-CM | POA: Insufficient documentation

## 2013-11-11 DIAGNOSIS — G4730 Sleep apnea, unspecified: Secondary | ICD-10-CM | POA: Insufficient documentation

## 2013-11-11 DIAGNOSIS — K219 Gastro-esophageal reflux disease without esophagitis: Secondary | ICD-10-CM | POA: Insufficient documentation

## 2013-11-11 DIAGNOSIS — M129 Arthropathy, unspecified: Secondary | ICD-10-CM | POA: Insufficient documentation

## 2013-11-11 DIAGNOSIS — Z79899 Other long term (current) drug therapy: Secondary | ICD-10-CM | POA: Insufficient documentation

## 2013-11-11 DIAGNOSIS — Z791 Long term (current) use of non-steroidal anti-inflammatories (NSAID): Secondary | ICD-10-CM | POA: Insufficient documentation

## 2013-11-11 DIAGNOSIS — Z8679 Personal history of other diseases of the circulatory system: Secondary | ICD-10-CM | POA: Insufficient documentation

## 2013-11-11 DIAGNOSIS — R071 Chest pain on breathing: Secondary | ICD-10-CM | POA: Insufficient documentation

## 2013-11-12 ENCOUNTER — Emergency Department (HOSPITAL_COMMUNITY): Payer: Medicare HMO

## 2013-11-12 ENCOUNTER — Emergency Department (HOSPITAL_COMMUNITY)
Admission: EM | Admit: 2013-11-12 | Discharge: 2013-11-12 | Disposition: A | Discharge status: Home / Self Care | Payer: Medicare HMO | Attending: Emergency Medicine | Admitting: Emergency Medicine

## 2013-11-12 ENCOUNTER — Encounter (HOSPITAL_COMMUNITY): Payer: Self-pay | Admitting: Emergency Medicine

## 2013-11-12 DIAGNOSIS — R0789 Other chest pain: Secondary | ICD-10-CM

## 2013-11-12 MED ORDER — HYDROCODONE-ACETAMINOPHEN 5-325 MG PO TABS: 1.0000 | ORAL_TABLET | Freq: Once | ORAL | Status: DC

## 2013-11-12 MED ORDER — MELOXICAM 15 MG PO TABS: 15.0000 mg | ORAL_TABLET | Freq: Every day | ORAL | Status: DC

## 2013-11-12 MED ORDER — KETOROLAC TROMETHAMINE 30 MG/ML IJ SOLN
30.0000 mg | Freq: Once | INTRAMUSCULAR | Status: AC
  Administered 2013-11-12: 30 mg via INTRAMUSCULAR
  Filled 2013-11-12: qty 1

## 2013-11-12 NOTE — ED Provider Notes (Signed)
CSN: [id number]     Arrival date & time 11/11/13  2353 History   First MD Initiated Contact with Patient 11/12/13 0016     Chief Complaint  Patient presents with  . right side pain    HPI  History provided by the patient and recent medical chart. Patient is a 43 year old female with history of bipolar disorder, GERD and morbid obesity who presents with complaints of right chest wall pain. Patient reports tripping and falling 1 week ago outside on a curve. She landed per minute her right side. Does report being evaluated in the emergency department with some x-rays of her lower leg and back. She did not have any broken bones. Since that time she has had some improvement of her initial pains and injuries however 4 days ago she began having worsening pain around her right back, side and ribs. Pain is worse with deep breathing, certain movements and cough. Patient has been using muscle relaxer and ibuprofen but states this has not helped significantly. She denies any significant cough symptoms. Denies any hemoptysis. No shortness of breath. Denies any fever, chills or sweats. No nausea vomiting. No weakness or numbness in extremities. No urinary or fecal incontinence, urinary retention or perineal numbness.   Past Medical History  Diagnosis Date  . GERD (gastroesophageal reflux disease)   . Sleep apnea     states had test-no cpap ordered-does snore  . Bipolar 1 disorder   . 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 Arthritis   . Migraines    Past Surgical History  Procedure Laterality Date  . Fracture surgery      rt femer/knee-plate-screws-  . Orif ankle fracture  03/16/2012    Procedure: OPEN REDUCTION INTERNAL FIXATION (ORIF) ANKLE FRACTURE;  Surgeon: Colin Rhein, MD;  Location: [place];  Service: Orthopedics;  Laterality: Right;  right lateral malleolus fracture   History reviewed. No pertinent family history. History  Substance Use Topics  . Smoking  status: Never Smoker   . Smokeless tobacco: Not on file  . Alcohol Use: No     Comment: occ   OB History   Grav Para Term Preterm Abortions TAB SAB Ect Mult Living                 Review of Systems  Constitutional: Negative for fever, chills and diaphoresis.  Respiratory: Negative for cough and shortness of breath.   Cardiovascular: Positive for chest pain.  Musculoskeletal: Positive for back pain.  Skin: Negative for rash.  Neurological: Negative for weakness and numbness.  All other systems reviewed and are negative.    Allergies  Percocet  Home Medications   Current Outpatient Rx  Name  Route  Sig  Dispense  Refill  . acetaminophen (TYLENOL) 500 MG tablet   Oral   Take 500 mg by mouth every 6 (six) hours as needed for mild pain or moderate pain.         . cyclobenzaprine (FLEXERIL) 10 MG tablet   Oral   Take 1 tablet (10 mg total) by mouth 2 (two) times daily as needed for muscle spasms.   20 tablet   0   . esomeprazole (NEXIUM) 40 MG capsule   Oral   Take 40 mg by mouth daily before breakfast.         . ibuprofen (ADVIL,MOTRIN) 800 MG tablet   Oral   Take 1 tablet (800 mg total) by mouth 3 (three) times  daily.   21 tablet   0   . methocarbamol (ROBAXIN) 750 MG tablet   Oral   Take 750 mg by mouth every 6 (six) hours as needed for muscle spasms.         . traZODone (DESYREL) 50 MG tablet   Oral   Take 50 mg by mouth at bedtime as needed for sleep.          BP 140/73  Pulse 79  Temp(Src) 97.9 F (36.6 C) (Oral)  Resp 18  Ht 5\' 4"  (1.626 m)  Wt 375 lb (170.099 kg)  BMI 64.34 kg/m2  SpO2 98%  LMP 10/21/2013 Physical Exam  Nursing note and vitals reviewed. Constitutional: She is oriented to person, place, and time. She appears well-developed and well-nourished. No distress.  Morbidly obese  HENT:  Head: Normocephalic.  Cardiovascular: Normal rate and regular rhythm.   Pulmonary/Chest: Effort normal and breath sounds normal. No  respiratory distress. She has no wheezes. She has no rales. She exhibits tenderness.    Tenderness over the right lateral chest wall and in the breast area. There is no bruising of the skin. No crepitus or deformities.  Abdominal: Soft. There is no tenderness. There is no rebound.  Musculoskeletal: Normal range of motion.       Lumbar back: She exhibits tenderness.       Back:  Neurological: She is alert and oriented to person, place, and time.  Skin: Skin is warm and dry. No rash noted.  Psychiatric: She has a normal mood and affect. Her behavior is normal.    ED Course  Procedures   DIAGNOSTIC STUDIES: Oxygen Saturation is 98% on room air.    COORDINATION OF CARE:  Nursing notes reviewed. Vital signs reviewed. Initial pt interview and examination performed.   12:38 AM-patient seen and evaluated. Patient well-appearing no acute distress. Discussed work up plan with pt at bedside, which includes x-rays of ribs. Pt agrees with plan.  X-rays reviewed. No broken ribs or other concerning cause of her right-sided chest wall pain. She does feel improved after Toradol. At this time will recommend continued rest at home and follow up with a PCP. Patient agrees with plan.  Treatment plan initiated: Medications  ketorolac (TORADOL) 30 MG/ML injection 30 mg (not administered)     Imaging Review Dg Ribs Unilateral W/chest Right  11/12/2013   CLINICAL DATA:  Status post fall 1 week ago on the right side, with worsening right-sided chest pain and back pain.  EXAM: RIGHT RIBS AND CHEST - 3+ VIEW  COMPARISON:  Chest radiograph performed 07/03/2012  FINDINGS: No displaced rib fractures are seen. Apparent irregularity of the right second rib is stable from the prior study and may reflect remote injury.  The lungs are mildly hypoexpanded. Vascular crowding is noted. There is no evidence of focal opacification, pleural effusion or pneumothorax. The cardiomediastinal silhouette is borderline normal  in/.  IMPRESSION: Lungs mildly hypoexpanded but grossly clear. No displaced rib fracture seen.   Electronically Signed   By: Garald Balding M.D.   On: 11/12/2013 01:45     MDM   1. Chest wall pain        Martie Lee, PA-C 11/12/13 [phone]

## 2013-11-12 NOTE — ED Notes (Signed)
Pt given a heat pack 

## 2013-11-12 NOTE — ED Provider Notes (Signed)
Medical screening examination/treatment/procedure(s) were performed by non-physician practitioner and as supervising physician I was immediately available for consultation/collaboration.   Delora Fuel, MD 47/07/61 [phone]

## 2013-11-12 NOTE — ED Notes (Addendum)
Presents post fall one week ago, fell on right side,  since has been having worsening right sided pain on right breast and goes into right back, described as sharp, worse with movement and stretching and coughing. Denies nausea, vomiting and SOB. Pain is made worse by raising right arm.  Tried bengay with no relief.  Back brace helped a little.

## 2013-11-12 NOTE — ED Notes (Signed)
Pt given a heating pack.  

## 2013-11-12 NOTE — Discharge Instructions (Signed)
Your x-ray today did not show any broken ribs or other concerning cause of your sharp chest wall pain. Please use the medication prescribed to help with your symptoms and followup with a primary care provider.    Chest Wall Pain Chest wall pain is pain felt in or around the chest bones and muscles. It may take up to 6 weeks to get better. It may take longer if you are active. Chest wall pain can happen on its own. Other times, things like germs, injury, coughing, or exercise can cause the pain. HOME CARE   Avoid activities that make you tired or cause pain. Try not to use your chest, belly (abdominal), or side muscles. Do not use heavy weights.  Put ice on the sore area.  Put ice in a plastic bag.  Place a towel between your skin and the bag.  Leave the ice on for 15-20 minutes for the first 2 days.  Only take medicine as told by your doctor. GET HELP RIGHT AWAY IF:   You have more pain or are very uncomfortable.  You have a fever.  Your chest pain gets worse.  You have new problems.  You feel sick to your stomach (nauseous) or throw up (vomit).  You start to sweat or feel lightheaded.  You have a cough with mucus (phlegm).  You cough up blood. MAKE SURE YOU:   Understand these instructions.  Will watch your condition.  Will get help right away if you are not doing well or get worse. Document Released: 03/23/2008 Document Revised: 12/28/2011 Document Reviewed: 06/01/2011 [facility] Patient Information 2014 [place], [place].

## 2014-01-02 ENCOUNTER — Other Ambulatory Visit: Payer: Self-pay

## 2014-01-02 DIAGNOSIS — Z1231 Encounter for screening mammogram for malignant neoplasm of breast: Secondary | ICD-10-CM

## 2014-01-16 ENCOUNTER — Ambulatory Visit: Payer: Medicare Other

## 2014-01-17 ENCOUNTER — Inpatient Hospital Stay: Admission: RE | Admit: 2014-01-17 | Payer: Medicare Other | Source: Ambulatory Visit

## 2014-04-02 ENCOUNTER — Ambulatory Visit (HOSPITAL_COMMUNITY): Payer: Self-pay | Admitting: Psychiatry

## 2014-06-29 ENCOUNTER — Ambulatory Visit (HOSPITAL_COMMUNITY)
Admission: RE | Admit: 2014-06-29 | Discharge: 2014-06-29 | Disposition: A | Discharge status: Home / Self Care | Payer: Medicare HMO | Source: Ambulatory Visit | Attending: Internal Medicine | Admitting: Internal Medicine

## 2014-06-29 ENCOUNTER — Other Ambulatory Visit (HOSPITAL_COMMUNITY): Payer: Self-pay | Admitting: Internal Medicine

## 2014-06-29 DIAGNOSIS — M542 Cervicalgia: Secondary | ICD-10-CM | POA: Insufficient documentation

## 2014-06-29 DIAGNOSIS — R52 Pain, unspecified: Secondary | ICD-10-CM

## 2014-06-29 DIAGNOSIS — M25519 Pain in unspecified shoulder: Secondary | ICD-10-CM | POA: Insufficient documentation

## 2014-07-19 ENCOUNTER — Other Ambulatory Visit: Payer: Self-pay | Admitting: Neurosurgery

## 2014-07-19 DIAGNOSIS — M5412 Radiculopathy, cervical region: Secondary | ICD-10-CM

## 2014-08-01 ENCOUNTER — Other Ambulatory Visit: Payer: Self-pay

## 2014-08-03 ENCOUNTER — Inpatient Hospital Stay: Admission: RE | Admit: 2014-08-03 | Payer: Self-pay | Source: Ambulatory Visit

## 2014-12-30 ENCOUNTER — Other Ambulatory Visit: Payer: Self-pay

## 2015-02-10 ENCOUNTER — Emergency Department (HOSPITAL_COMMUNITY): Payer: Medicare HMO

## 2015-02-10 ENCOUNTER — Emergency Department (HOSPITAL_COMMUNITY)
Admission: EM | Admit: 2015-02-10 | Discharge: 2015-02-10 | Disposition: A | Discharge status: Home / Self Care | Payer: Medicare HMO | Attending: Emergency Medicine | Admitting: Emergency Medicine

## 2015-02-10 ENCOUNTER — Encounter (HOSPITAL_COMMUNITY): Payer: Self-pay | Admitting: Emergency Medicine

## 2015-02-10 DIAGNOSIS — Y9389 Activity, other specified: Secondary | ICD-10-CM | POA: Diagnosis not present

## 2015-02-10 DIAGNOSIS — W1839XA Other fall on same level, initial encounter: Secondary | ICD-10-CM | POA: Insufficient documentation

## 2015-02-10 DIAGNOSIS — Z79899 Other long term (current) drug therapy: Secondary | ICD-10-CM | POA: Insufficient documentation

## 2015-02-10 DIAGNOSIS — Z8659 Personal history of other mental and behavioral disorders: Secondary | ICD-10-CM | POA: Insufficient documentation

## 2015-02-10 DIAGNOSIS — S299XXA Unspecified injury of thorax, initial encounter: Secondary | ICD-10-CM | POA: Insufficient documentation

## 2015-02-10 DIAGNOSIS — M1990 Unspecified osteoarthritis, unspecified site: Secondary | ICD-10-CM | POA: Insufficient documentation

## 2015-02-10 DIAGNOSIS — Y92000 Kitchen of unspecified non-institutional (private) residence as  the place of occurrence of the external cause: Secondary | ICD-10-CM | POA: Insufficient documentation

## 2015-02-10 DIAGNOSIS — R52 Pain, unspecified: Secondary | ICD-10-CM

## 2015-02-10 DIAGNOSIS — S3991XA Unspecified injury of abdomen, initial encounter: Secondary | ICD-10-CM | POA: Diagnosis not present

## 2015-02-10 DIAGNOSIS — G43909 Migraine, unspecified, not intractable, without status migrainosus: Secondary | ICD-10-CM | POA: Diagnosis not present

## 2015-02-10 DIAGNOSIS — S24109A Unspecified injury at unspecified level of thoracic spinal cord, initial encounter: Secondary | ICD-10-CM | POA: Diagnosis not present

## 2015-02-10 DIAGNOSIS — Y998 Other external cause status: Secondary | ICD-10-CM | POA: Diagnosis not present

## 2015-02-10 DIAGNOSIS — K219 Gastro-esophageal reflux disease without esophagitis: Secondary | ICD-10-CM | POA: Diagnosis not present

## 2015-02-10 DIAGNOSIS — G8929 Other chronic pain: Secondary | ICD-10-CM | POA: Diagnosis not present

## 2015-02-10 DIAGNOSIS — R109 Unspecified abdominal pain: Secondary | ICD-10-CM

## 2015-02-10 DIAGNOSIS — M5489 Other dorsalgia: Secondary | ICD-10-CM

## 2015-02-10 DIAGNOSIS — Z791 Long term (current) use of non-steroidal anti-inflammatories (NSAID): Secondary | ICD-10-CM | POA: Insufficient documentation

## 2015-02-10 DIAGNOSIS — S3992XA Unspecified injury of lower back, initial encounter: Secondary | ICD-10-CM | POA: Insufficient documentation

## 2015-02-10 DIAGNOSIS — Z3202 Encounter for pregnancy test, result negative: Secondary | ICD-10-CM | POA: Diagnosis not present

## 2015-02-10 LAB — CBC WITH DIFFERENTIAL/PLATELET
Basophils Absolute: 0 10*3/uL (ref 0.0–0.1)
Basophils Relative: 1 % (ref 0–1)
Eosinophils Absolute: 0.1 10*3/uL (ref 0.0–0.7)
Eosinophils Relative: 2 % (ref 0–5)
HCT: 31.1 % — ABNORMAL LOW (ref 36.0–46.0)
Hemoglobin: 10 g/dL — ABNORMAL LOW (ref 12.0–15.0)
Lymphocytes Relative: 49 % — ABNORMAL HIGH (ref 12–46)
Lymphs Abs: 3.3 10*3/uL (ref 0.7–4.0)
MCH: 27.5 pg (ref 26.0–34.0)
MCHC: 32.2 g/dL (ref 30.0–36.0)
MCV: 85.4 fL (ref 78.0–100.0)
Monocytes Absolute: 0.6 10*3/uL (ref 0.1–1.0)
Monocytes Relative: 8 % (ref 3–12)
Neutro Abs: 2.7 10*3/uL (ref 1.7–7.7)
Neutrophils Relative %: 40 % — ABNORMAL LOW (ref 43–77)
Platelets: 325 10*3/uL (ref 150–400)
RBC: 3.64 MIL/uL — ABNORMAL LOW (ref 3.87–5.11)
RDW: 15.1 % (ref 11.5–15.5)
WBC: 6.7 10*3/uL (ref 4.0–10.5)

## 2015-02-10 LAB — LIPASE, BLOOD: Lipase: 28 U/L (ref 11–59)

## 2015-02-10 LAB — URINALYSIS, ROUTINE W REFLEX MICROSCOPIC
Bilirubin Urine: NEGATIVE
Glucose, UA: NEGATIVE mg/dL
Hgb urine dipstick: NEGATIVE
Ketones, ur: NEGATIVE mg/dL
Nitrite: NEGATIVE
Protein, ur: NEGATIVE mg/dL
Specific Gravity, Urine: 1.028 (ref 1.005–1.030)
Urobilinogen, UA: 1 mg/dL (ref 0.0–1.0)
pH: 6.5 (ref 5.0–8.0)

## 2015-02-10 LAB — COMPREHENSIVE METABOLIC PANEL
ALT: 13 U/L (ref 0–35)
AST: 18 U/L (ref 0–37)
Albumin: 3.2 g/dL — ABNORMAL LOW (ref 3.5–5.2)
Alkaline Phosphatase: 73 U/L (ref 39–117)
Anion gap: 5 (ref 5–15)
BUN: 14 mg/dL (ref 6–23)
CO2: 24 mmol/L (ref 19–32)
Calcium: 8.1 mg/dL — ABNORMAL LOW (ref 8.4–10.5)
Chloride: 107 mmol/L (ref 96–112)
Creatinine, Ser: 0.82 mg/dL (ref 0.50–1.10)
GFR calc Af Amer: >90 mL/min (ref >90)
GFR calc non Af Amer: 85 mL/min — ABNORMAL LOW (ref >90)
Glucose, Bld: 73 mg/dL (ref 70–99)
Potassium: 3.7 mmol/L (ref 3.5–5.1)
Sodium: 136 mmol/L (ref 135–145)
Total Bilirubin: 0.3 mg/dL (ref 0.3–1.2)
Total Protein: 7.8 g/dL (ref 6.0–8.3)

## 2015-02-10 LAB — URINE MICROSCOPIC-ADD ON

## 2015-02-10 LAB — PREGNANCY, URINE: Preg Test, Ur: NEGATIVE

## 2015-02-10 MED ORDER — TRAMADOL HCL 50 MG PO TABS
50.0000 mg | ORAL_TABLET | Freq: Once | ORAL | Status: AC
  Administered 2015-02-10: 50 mg via ORAL
  Filled 2015-02-10: qty 1

## 2015-02-10 MED ORDER — IBUPROFEN 200 MG PO TABS
400.0000 mg | ORAL_TABLET | Freq: Once | ORAL | Status: AC
  Administered 2015-02-10: 400 mg via ORAL
  Filled 2015-02-10: qty 2

## 2015-02-10 MED ORDER — HYDROCODONE-ACETAMINOPHEN 5-325 MG PO TABS: 1.0000 | ORAL_TABLET | Freq: Four times a day (QID) | ORAL | Status: DC | PRN

## 2015-02-10 MED ORDER — TRAMADOL HCL 50 MG PO TABS: 50.0000 mg | ORAL_TABLET | Freq: Four times a day (QID) | ORAL | Status: DC | PRN

## 2015-02-10 NOTE — ED Provider Notes (Addendum)
CSN: [id number]     Arrival date & time 02/10/15  1151 History   First MD Initiated Contact with Patient 02/10/15 1233     Chief Complaint  Patient presents with  . Back Pain  . Rib Injury  . Abdominal Pain    tenderness noted by PA     (Consider location/radiation/quality/duration/timing/severity/associated sxs/prior Treatment) Patient is a 44 y.o. female presenting with back pain and abdominal pain. The history is provided by the patient.  Back Pain Associated symptoms: abdominal pain   Associated symptoms: no chest pain, no dysuria, no fever, no headaches, no numbness and no weakness   Abdominal Pain Associated symptoms: no chest pain, no chills, no cough, no diarrhea, no dysuria, no fever, no hematuria, no shortness of breath, no sore throat and no vomiting   pt c/o pain to right lateral lower ribs and upper abd and mid back pain for the past month, or more.  Pt states she had slip/fall in kitchen approximately 1 month ago, landing on that side of body.  No loc w that fall. Pt has been functioning normally since that fall, although states pain persists.  She indicates even prior to fall, for possibly 1 yr, has had mid back pain.  Pt states her doctors also had discussed possibly doing mri neck due to chronic neck pain.  No recent worsening of neck pain. No radicular pain, or any extremity numbness/weakness. Pain not related to eating. Normal appetite. Nausea earlier, no vomiting. Is having normal bms. Denies abd distension. No dysuria or hematuria. No lower abdominal pain or vaginal discharge or bleeding. Pt denies hx gallstones. No hx kidney stones or pyelo. No hx ddd.  Pt denies any chest pain or discomfort. No cough or uri c/o. No sob. Pt denies leg pain or swelling. No recent immobility, surgery, or travel. Non smoker.       Past Medical History  Diagnosis Date  . GERD (gastroesophageal reflux disease)   . Sleep apnea     states had test-no cpap ordered-does snore  . Bipolar 1  disorder   . 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 Arthritis   . Migraines    Past Surgical History  Procedure Laterality Date  . Fracture surgery      rt femer/knee-plate-screws-  . Orif ankle fracture  03/16/2012    Procedure: OPEN REDUCTION INTERNAL FIXATION (ORIF) ANKLE FRACTURE;  Surgeon: Colin Rhein, MD;  Location: [facility];  Service: Orthopedics;  Laterality: Right;  right lateral malleolus fracture   No family history on file. History  Substance Use Topics  . Smoking status: Never Smoker   . Smokeless tobacco: Not on file  . Alcohol Use: No     Comment: occ   OB History    No data available     Review of Systems  Constitutional: Negative for fever and chills.  HENT: Negative for sore throat.   Eyes: Negative for redness.  Respiratory: Negative for cough and shortness of breath.   Cardiovascular: Negative for chest pain and leg swelling.  Gastrointestinal: Positive for abdominal pain. Negative for vomiting and diarrhea.  Genitourinary: Negative for dysuria, hematuria and flank pain.  Musculoskeletal: Positive for back pain.  Skin: Negative for rash.  Neurological: Negative for weakness, numbness and headaches.  Hematological: Does not bruise/bleed easily.  Psychiatric/Behavioral: Negative for confusion.      Allergies  Review of patient's allergies indicates no known allergies.  Home Medications  Prior to Admission medications   Medication Sig Start Date End Date Taking? Authorizing Provider  acetaminophen (TYLENOL) 650 MG CR tablet Take 650 mg by mouth every 8 (eight) hours as needed for pain.   Yes Historical Provider, MD  cetirizine (ZYRTEC) 10 MG tablet Take 10 mg by mouth at bedtime.   Yes Historical Provider, MD  cyclobenzaprine (FLEXERIL) 10 MG tablet Take 1 tablet (10 mg total) by mouth 2 (two) times daily as needed for muscle spasms. 11/04/13  Yes Ignacia Felling, PA-C  methocarbamol (ROBAXIN) 750 MG tablet Take  750 mg by mouth every 6 (six) hours as needed for muscle spasms.   Yes Historical Provider, MD  omeprazole (PRILOSEC) 20 MG capsule Take 40 mg by mouth daily.   Yes Historical Provider, MD  ibuprofen (ADVIL,MOTRIN) 800 MG tablet Take 1 tablet (800 mg total) by mouth 3 (three) times daily. Patient not taking: Reported on 02/10/2015 11/04/13   Ignacia Felling, PA-C  meloxicam (MOBIC) 15 MG tablet Take 1 tablet (15 mg total) by mouth daily. Patient not taking: Reported on 02/10/2015 11/12/13   Hazel Sams, PA-C   BP 129/70 mmHg  Pulse 76  Temp(Src) 97.9 F (36.6 C) (Oral)  Resp 20  SpO2 100%  LMP 01/10/2015 (Approximate) Physical Exam  Constitutional: She appears well-developed and well-nourished. No distress.  HENT:  Head: Atraumatic.  Eyes: Conjunctivae are normal. No scleral icterus.  Neck: Neck supple. No tracheal deviation present.  Cardiovascular: Normal rate, regular rhythm, normal heart sounds and intact distal pulses.  Exam reveals no gallop and no friction rub.   No murmur heard. Pulmonary/Chest: Effort normal and breath sounds normal. No respiratory distress. She exhibits tenderness.  +right lower/lateral chest wall tenderness. Chest wall movement normal. No splint or apparent pleuritic discomfort.   Abdominal: Soft. Normal appearance and bowel sounds are normal. She exhibits no distension and no mass. There is tenderness. There is no rebound and no guarding.  Morbidly obese. Mild right costal margin area tenderness. No crepitus. No swelling. No hernias.   Genitourinary:  No cva tenderness  Musculoskeletal: She exhibits no edema or tenderness.  Lower thoracic area/upper lumbar diffuse tenderness. Spine aligned no step off. On remainder CTLS exam, no midline/focal bony tenderness.   Neurological: She is alert.  Motor intact bil. Str 5/5. Steady gait.   Skin: Skin is warm and dry. No rash noted. She is not diaphoretic.  Psychiatric: She has a normal mood and affect.  Nursing note  and vitals reviewed.   ED Course  Procedures (including critical care time) Labs Review   Results for orders placed or performed during the hospital encounter of 02/10/15  CBC with Differential/Platelet  Result Value Ref Range   WBC 6.7 4.0 - 10.5 K/uL   RBC 3.64 (L) 3.87 - 5.11 MIL/uL   Hemoglobin 10.0 (L) 12.0 - 15.0 g/dL   HCT 31.1 (L) 36.0 - 46.0 %   MCV 85.4 78.0 - 100.0 fL   MCH 27.5 26.0 - 34.0 pg   MCHC 32.2 30.0 - 36.0 g/dL   RDW 15.1 11.5 - 15.5 %   Platelets 325 150 - 400 K/uL   Neutrophils Relative % 40 (L) 43 - 77 %   Neutro Abs 2.7 1.7 - 7.7 K/uL   Lymphocytes Relative 49 (H) 12 - 46 %   Lymphs Abs 3.3 0.7 - 4.0 K/uL   Monocytes Relative 8 3 - 12 %   Monocytes Absolute 0.6 0.1 - 1.0 K/uL   Eosinophils Relative 2 0 -  5 %   Eosinophils Absolute 0.1 0.0 - 0.7 K/uL   Basophils Relative 1 0 - 1 %   Basophils Absolute 0.0 0.0 - 0.1 K/uL  Comprehensive metabolic panel  Result Value Ref Range   Sodium 136 135 - 145 mmol/L   Potassium 3.7 3.5 - 5.1 mmol/L   Chloride 107 96 - 112 mmol/L   CO2 24 19 - 32 mmol/L   Glucose, Bld 73 70 - 99 mg/dL   BUN 14 6 - 23 mg/dL   Creatinine, Ser 0.82 0.50 - 1.10 mg/dL   Calcium 8.1 (L) 8.4 - 10.5 mg/dL   Total Protein 7.8 6.0 - 8.3 g/dL   Albumin 3.2 (L) 3.5 - 5.2 g/dL   AST 18 0 - 37 U/L   ALT 13 0 - 35 U/L   Alkaline Phosphatase 73 39 - 117 U/L   Total Bilirubin 0.3 0.3 - 1.2 mg/dL   GFR calc non Af Amer 85 (L) >90 mL/min   GFR calc Af Amer >90 >90 mL/min   Anion gap 5 5 - 15  Lipase, blood  Result Value Ref Range   Lipase 28 11 - 59 U/L  Urinalysis, Routine w reflex microscopic  Result Value Ref Range   Color, Urine YELLOW YELLOW   APPearance CLEAR CLEAR   Specific Gravity, Urine 1.028 1.005 - 1.030   pH 6.5 5.0 - 8.0   Glucose, UA NEGATIVE NEGATIVE mg/dL   Hgb urine dipstick NEGATIVE NEGATIVE   Bilirubin Urine NEGATIVE NEGATIVE   Ketones, ur NEGATIVE NEGATIVE mg/dL   Protein, ur NEGATIVE NEGATIVE mg/dL    Urobilinogen, UA 1.0 0.0 - 1.0 mg/dL   Nitrite NEGATIVE NEGATIVE   Leukocytes, UA TRACE (A) NEGATIVE  Urine microscopic-add on  Result Value Ref Range   Squamous Epithelial / LPF FEW (A) RARE   WBC, UA 0-2 <3 WBC/hpf   Bacteria, UA MANY (A) RARE   Urine-Other MUCOUS PRESENT   Pregnancy, urine  Result Value Ref Range   Preg Test, Ur NEGATIVE NEGATIVE   Dg Ribs Unilateral W/chest Right  02/10/2015   CLINICAL DATA:  Right rib pain, no injury  EXAM: RIGHT RIBS AND CHEST - 3+ VIEW  COMPARISON:  Chest x-ray 11/12/2013  FINDINGS: Cardiac enlargement without heart failure. Lungs are clear without infiltrate effusion or pneumothorax  Negative for right rib fracture.  No rib lesion.  IMPRESSION: No acute abnormality.   Electronically Signed   By: Franchot Gallo M.D.   On: 02/10/2015 16:01   Dg Thoracic Spine 2 View  02/10/2015   CLINICAL DATA:  Right-sided rib pain, back pain  EXAM: THORACIC SPINE - 2 VIEW  COMPARISON:  None.  FINDINGS: There is no evidence of thoracic spine fracture. Alignment is normal. No other significant bone abnormalities are identified. Mild degenerative disc disease of the mid thoracic spine.  IMPRESSION: No acute osseous injury of the thoracic spine.   Electronically Signed   By: Kathreen Devoid   On: 02/10/2015 16:01   Dg Lumbar Spine Complete  02/10/2015   CLINICAL DATA:  Right-sided rib pain, upper and lower back pain for 1 month. No known injury.  EXAM: LUMBAR SPINE - COMPLETE 4+ VIEW  COMPARISON:  11/04/2013  FINDINGS: There is no evidence of lumbar spine fracture. Alignment is normal. Intervertebral disc spaces are maintained.  IMPRESSION: Negative.   Electronically Signed   By: Kathreen Devoid   On: 02/10/2015 16:01      MDM   Labs. Imaging.  Reviewed nursing  notes and prior charts for additional history.   Pt states has not taken any med today for pain.  Pt has ride, does not have to drive.  Ultram po. Motrin po.  Hr 72, rr 16, pulse ox 100% room air.  Pt  comfortable. Pain improved. abd soft nt.  Pt indicates pcp is [facility] and can follow up there.   Discussed xrays w pt.  Pt currently appears stable for d/c.   Return precautions provided.  At d/c, pt requests stronger pain rx than ultram for home - will give small quantity rx for norco.       Lajean Saver, MD 02/10/15 [phone]

## 2015-02-10 NOTE — ED Notes (Signed)
Pt is alert and orientated.  She is able to receive discharge instructions.

## 2015-02-10 NOTE — Discharge Instructions (Signed)
It was our pleasure to provide your ER care today - we hope that you feel better.  Avoid heavy lifting or bending at waist for the next few days.   Take motrin or aleve as need for pain. You may also take hydrocodone as need for pain. No driving when taking hydrocodone. Also, do not take tylenol or acetaminophen containing medication when taking hydrocodone.   You may also take ultram as need for pain - no driving when taking.  Follow up with primary care doctor in the next few days for recheck - call office tomorrow to arrange follow up appointment.  Return to ER if worse, new symptoms, fevers, severe abdominal pain, chest pain, trouble breathing, other concern.  You were given pain medication in the ER - no driving for the next 4 hours.       Back Pain, Adult Low back pain is very common. About 1 in 5 people have back pain.The cause of low back pain is rarely dangerous. The pain often gets better over time.About half of people with a sudden onset of back pain feel better in just 2 weeks. About 8 in 10 people feel better by 6 weeks.  CAUSES Some common causes of back pain include:  Strain of the muscles or ligaments supporting the spine.  Wear and tear (degeneration) of the spinal discs.  Arthritis.  Direct injury to the back. DIAGNOSIS Most of the time, the direct cause of low back pain is not known.However, back pain can be treated effectively even when the exact cause of the pain is unknown.Answering your caregiver's questions about your overall health and symptoms is one of the most accurate ways to make sure the cause of your pain is not dangerous. If your caregiver needs more information, he or she may order lab work or imaging tests (X-rays or MRIs).However, even if imaging tests show changes in your back, this usually does not require surgery. HOME CARE INSTRUCTIONS For many people, back pain returns.Since low back pain is rarely dangerous, it is often a condition  that people can learn to [facility] their own.   Remain active. It is stressful on the back to sit or stand in one place. Do not sit, drive, or stand in one place for more than 30 minutes at a time. Take short walks on level surfaces as soon as pain allows.Try to increase the length of time you walk each day.  Do not stay in bed.Resting more than 1 or 2 days can delay your recovery.  Do not avoid exercise or work.Your body is made to move.It is not dangerous to be active, even though your back may hurt.Your back will likely heal faster if you return to being active before your pain is gone.  Pay attention to your body when you bend and lift. Many people have less discomfortwhen lifting if they bend their knees, keep the load close to their bodies,and avoid twisting. Often, the most comfortable positions are those that put less stress on your recovering back.  Find a comfortable position to sleep. Use a firm mattress and lie on your side with your knees slightly bent. If you lie on your back, put a pillow under your knees.  Only take over-the-counter or prescription medicines as directed by your caregiver. Over-the-counter medicines to reduce pain and inflammation are often the most helpful.Your caregiver may prescribe muscle relaxant drugs.These medicines help dull your pain so you can more quickly return to your normal activities and healthy exercise.  Put ice on the injured area.  Put ice in a plastic bag.  Place a towel between your skin and the bag.  Leave the ice on for 15-20 minutes, 03-04 times a day for the first 2 to 3 days. After that, ice and heat may be alternated to reduce pain and spasms.  Ask your caregiver about trying back exercises and gentle massage. This may be of some benefit.  Avoid feeling anxious or stressed.Stress increases muscle tension and can worsen back pain.It is important to recognize when you are anxious or stressed and learn ways to manage  it.Exercise is a great option. SEEK MEDICAL CARE IF:  You have pain that is not relieved with rest or medicine.  You have pain that does not improve in 1 week.  You have new symptoms.  You are generally not feeling well. SEEK IMMEDIATE MEDICAL CARE IF:   You have pain that radiates from your back into your legs.  You develop new bowel or bladder control problems.  You have unusual weakness or numbness in your arms or legs.  You develop nausea or vomiting.  You develop abdominal pain.  You feel faint. Document Released: 10/05/2005 Document Revised: 04/05/2012 Document Reviewed: 02/06/2014 [facility] Patient Information 2015 [place], [place]. This information is not intended to replace advice given to you by your health care provider. Make sure you discuss any questions you have with your health care provider.    Flank Pain Flank pain refers to pain that is located on the side of the body between the upper abdomen and the back. The pain may occur over a short period of time (acute) or may be long-term or reoccurring (chronic). It may be mild or severe. Flank pain can be caused by many things. CAUSES  Some of the more common causes of flank pain include:  Muscle strains.   Muscle spasms.   A disease of your spine (vertebral disk disease).   A lung infection (pneumonia).   Fluid around your lungs (pulmonary edema).   A kidney infection.   Kidney stones.   A very painful skin rash caused by the chickenpox virus (shingles).   Gallbladder disease.  [place] care will depend on the cause of your pain. In general,  Rest as directed by your caregiver.  Drink enough fluids to keep your urine clear or pale yellow.  Only take over-the-counter or prescription medicines as directed by your caregiver. Some medicines may help relieve the pain.  Tell your caregiver about any changes in your pain.  Follow up with your caregiver as directed. SEEK  IMMEDIATE MEDICAL CARE IF:   Your pain is not controlled with medicine.   You have new or worsening symptoms.  Your pain increases.   You have abdominal pain.   You have shortness of breath.   You have persistent nausea or vomiting.   You have swelling in your abdomen.   You feel faint or pass out.   You have blood in your urine.  You have a fever or persistent symptoms for more than 2-3 days.  You have a fever and your symptoms suddenly get worse. MAKE SURE YOU:   Understand these instructions.  Will watch your condition.  Will get help right away if you are not doing well or get worse. Document Released: 11/26/2005 Document Revised: 06/29/2012 Document Reviewed: 05/19/2012 [facility] Patient Information 2015 [place], [place]. This information is not intended to replace advice given to you by your health care provider. Make  sure you discuss any questions you have with your health care provider.

## 2015-02-10 NOTE — ED Notes (Signed)
Pt from home c/o thoracic and lower back pain "for a while". Pt sts that she is supposed to make an appt for MRI because her medication are not relieving the pain. Pt adds that she fell in the kitchen 1 month ago and her R ribs have been hurting since. Pt denies urinary s/sx. Pt is A&O and in NAD

## 2015-02-10 NOTE — ED Notes (Signed)
Pt states she has acid reflux and she makes herself and she makes herself throw up.

## 2015-02-10 NOTE — ED Provider Notes (Signed)
CSN: [id number]     Arrival date & time 02/10/15  1151 History  This chart was scribed for non-physician practitioner, Caryl Ada, PA-C, working with Lajean Saver, MD, by Peyton Bottoms ED Scribe. This patient was seen in room WTR8/WTR8 and the patient's care was started at 12:38 PM    Chief Complaint  Patient presents with  . Back Pain  . Rib Injury   Patient is a 44 y.o. female presenting with back pain. The history is provided by the patient. No language interpreter was used.  Back Pain Location:  Lumbar spine Quality:  Aching Radiates to:  Does not radiate Pain severity:  Moderate Onset quality:  Gradual Timing:  Constant Progression:  Unchanged Relieved by:  Nothing Worsened by:  Nothing tried Ineffective treatments:  None tried Associated symptoms: abdominal pain    HPI Comments: Danielle [name] is a 43 y.o. female with a PMHx of GERD, bipolar disorder, drug abuse, arthritis, migraines and sleep apnea, who presents to the Emergency Department complaining of right flank and bilateral lower back pain and midline back pain that initially began about 1 year ago, and increased in severity last night. Patient states that she has taken muscle relaxer medication in the past for similar symptoms with no relief. Patient is seen by PCP Dr. Gwenyth Allegra. Patient denies having done an MRI in the past. Patient reports hx of self-induced vomiting after eating because she "gets full really fast".  Past Medical History  Diagnosis Date  . GERD (gastroesophageal reflux disease)   . Sleep apnea     states had test-no cpap ordered-does snore  . Bipolar 1 disorder   . 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 Arthritis   . Migraines    Past Surgical History  Procedure Laterality Date  . Fracture surgery      rt femer/knee-plate-screws-  . Orif ankle fracture  03/16/2012    Procedure: OPEN REDUCTION INTERNAL FIXATION (ORIF) ANKLE FRACTURE;  Surgeon: Colin Rhein, MD;   Location: [facility] [facility];  Service: Orthopedics;  Laterality: Right;  right lateral malleolus fracture   No family history on file. History  Substance Use Topics  . Smoking status: Never Smoker   . Smokeless tobacco: Not on file  . Alcohol Use: No     Comment: occ   OB History    No data available     Review of Systems  Cardiovascular: Positive for leg swelling.  Gastrointestinal: Positive for abdominal pain.  Genitourinary: Positive for flank pain.  Musculoskeletal: Positive for back pain.  All other systems reviewed and are negative.  Allergies  Percocet and Vicodin  Home Medications   Prior to Admission medications   Medication Sig Start Date End Date Taking? Authorizing Provider  acetaminophen (TYLENOL) 500 MG tablet Take 500 mg by mouth every 6 (six) hours as needed for mild pain or moderate pain.    Historical Provider, MD  cyclobenzaprine (FLEXERIL) 10 MG tablet Take 1 tablet (10 mg total) by mouth 2 (two) times daily as needed for muscle spasms. 11/04/13   Ignacia Felling, PA-C  esomeprazole (NEXIUM) 40 MG capsule Take 40 mg by mouth daily before breakfast.    Historical Provider, MD  ibuprofen (ADVIL,MOTRIN) 800 MG tablet Take 1 tablet (800 mg total) by mouth 3 (three) times daily. 11/04/13   Ignacia Felling, PA-C  meloxicam (MOBIC) 15 MG tablet Take 1 tablet (15 mg total) by mouth daily. 11/12/13   Hazel Sams, PA-C  methocarbamol (ROBAXIN) 750 MG tablet Take 750 mg by mouth every 6 (six) hours as needed for muscle spasms.    Historical Provider, MD  traZODone (DESYREL) 50 MG tablet Take 50 mg by mouth at bedtime as needed for sleep.    Historical Provider, MD   Triage Vitals: BP 135/82 mmHg  Pulse 87  Temp(Src) 98.1 F (36.7 C) (Oral)  Resp 20  SpO2 100%  LMP 01/10/2015 (Approximate)  Physical Exam  Constitutional: She appears well-developed and well-nourished.  HENT:  Head: Normocephalic and atraumatic.  Eyes: Conjunctivae are normal. Right eye  exhibits no discharge. Left eye exhibits no discharge.  Pulmonary/Chest: Effort normal. No respiratory distress.  Abdominal: There is tenderness.  Tenderness to RUQ, right and left flank.  Neurological: She is alert. Coordination normal.  Skin: Skin is warm and dry. No rash noted. She is not diaphoretic. No erythema.  Psychiatric: She has a normal mood and affect.  Nursing note and vitals reviewed.  ED Course  Procedures (including critical care time)  DIAGNOSTIC STUDIES: Oxygen Saturation is 100% on RA, normal by my interpretation.    COORDINATION OF CARE: 12:43 PM- Discussed plans to order diagnostic lab work and urinalysis. Pt advised of plan for treatment and pt agrees.  Labs Review Labs Reviewed  CBC WITH DIFFERENTIAL/PLATELET  COMPREHENSIVE METABOLIC PANEL  LIPASE, BLOOD  URINALYSIS, ROUTINE W REFLEX MICROSCOPIC    Imaging Review No results found.   EKG Interpretation None     MDM   Final diagnoses:  None    Pt moved to exam room.  Pt complains of intermittant ruq pain.  Pt makes herself vomit because she feels full.  Hollace Kinnier [place], PA-C 02/10/15 [place], MD 02/10/15 [phone]

## 2015-07-30 ENCOUNTER — Emergency Department (HOSPITAL_COMMUNITY)
Admission: EM | Admit: 2015-07-30 | Discharge: 2015-07-30 | Disposition: A | Discharge status: Home / Self Care | Payer: Medicare HMO | Attending: Emergency Medicine | Admitting: Emergency Medicine

## 2015-07-30 ENCOUNTER — Encounter (HOSPITAL_COMMUNITY): Payer: Self-pay | Admitting: Emergency Medicine

## 2015-07-30 DIAGNOSIS — Z8659 Personal history of other mental and behavioral disorders: Secondary | ICD-10-CM | POA: Insufficient documentation

## 2015-07-30 DIAGNOSIS — H8111 Benign paroxysmal vertigo, right ear: Secondary | ICD-10-CM | POA: Diagnosis not present

## 2015-07-30 DIAGNOSIS — K219 Gastro-esophageal reflux disease without esophagitis: Secondary | ICD-10-CM | POA: Diagnosis not present

## 2015-07-30 DIAGNOSIS — M1990 Unspecified osteoarthritis, unspecified site: Secondary | ICD-10-CM | POA: Diagnosis not present

## 2015-07-30 DIAGNOSIS — Z8669 Personal history of other diseases of the nervous system and sense organs: Secondary | ICD-10-CM | POA: Insufficient documentation

## 2015-07-30 DIAGNOSIS — R42 Dizziness and giddiness: Secondary | ICD-10-CM | POA: Diagnosis present

## 2015-07-30 LAB — BASIC METABOLIC PANEL
Anion gap: 10 (ref 5–15)
BUN: 6 mg/dL (ref 6–20)
CO2: 28 mmol/L (ref 22–32)
Calcium: 8.7 mg/dL — ABNORMAL LOW (ref 8.9–10.3)
Chloride: 97 mmol/L — ABNORMAL LOW (ref 101–111)
Creatinine, Ser: 0.9 mg/dL (ref 0.44–1.00)
GFR calc Af Amer: >60 mL/min (ref >60)
GFR calc non Af Amer: >60 mL/min (ref >60)
Glucose, Bld: 96 mg/dL (ref 65–99)
Potassium: 3.4 mmol/L — ABNORMAL LOW (ref 3.5–5.1)
Sodium: 135 mmol/L (ref 135–145)

## 2015-07-30 LAB — CBC WITH DIFFERENTIAL/PLATELET
Basophils Absolute: 0 10*3/uL (ref 0.0–0.1)
Basophils Relative: 0 %
Eosinophils Absolute: 0.1 10*3/uL (ref 0.0–0.7)
Eosinophils Relative: 1 %
HCT: 33.2 % — ABNORMAL LOW (ref 36.0–46.0)
Hemoglobin: 10.8 g/dL — ABNORMAL LOW (ref 12.0–15.0)
Lymphocytes Relative: 49 %
Lymphs Abs: 3.2 10*3/uL (ref 0.7–4.0)
MCH: 27.5 pg (ref 26.0–34.0)
MCHC: 32.5 g/dL (ref 30.0–36.0)
MCV: 84.5 fL (ref 78.0–100.0)
Monocytes Absolute: 0.6 10*3/uL (ref 0.1–1.0)
Monocytes Relative: 10 %
Neutro Abs: 2.6 10*3/uL (ref 1.7–7.7)
Neutrophils Relative %: 40 %
Platelets: 350 10*3/uL (ref 150–400)
RBC: 3.93 MIL/uL (ref 3.87–5.11)
RDW: 15.8 % — ABNORMAL HIGH (ref 11.5–15.5)
WBC: 6.6 10*3/uL (ref 4.0–10.5)

## 2015-07-30 MED ORDER — MECLIZINE HCL 50 MG PO TABS: 25.0000 mg | ORAL_TABLET | Freq: Three times a day (TID) | ORAL | Status: DC | PRN

## 2015-07-30 MED ORDER — ONDANSETRON HCL 4 MG PO TABS: 4.0000 mg | ORAL_TABLET | Freq: Three times a day (TID) | ORAL | Status: DC | PRN

## 2015-07-30 MED ORDER — ONDANSETRON 4 MG PO TBDP: 4.0000 mg | ORAL_TABLET | Freq: Four times a day (QID) | ORAL | Status: DC | PRN

## 2015-07-30 MED ORDER — MECLIZINE HCL 25 MG PO TABS
25.0000 mg | ORAL_TABLET | Freq: Once | ORAL | Status: AC
  Administered 2015-07-30: 25 mg via ORAL
  Filled 2015-07-30: qty 1

## 2015-07-30 MED ORDER — ONDANSETRON HCL 4 MG/2ML IJ SOLN
4.0000 mg | Freq: Once | INTRAMUSCULAR | Status: AC
  Administered 2015-07-30: 4 mg via INTRAVENOUS
  Filled 2015-07-30: qty 2

## 2015-07-30 NOTE — ED Notes (Signed)
Per EMS: pt from home c/o dizziness x 2 days; pt sts started after taking hctz that she is supposed to be taking but does not normally take; BP 142/98; 22g R hand; pt sts some blurry vision

## 2015-07-30 NOTE — Discharge Instructions (Signed)
Benign Positional Vertigo Vertigo is the feeling that you or your surroundings are moving when they are not. Benign positional vertigo is the most common form of vertigo. The cause of this condition is not serious (is benign). This condition is triggered by certain movements and positions (is positional). This condition can be dangerous if it occurs while you are doing something that could endanger you or others, such as driving.  CAUSES In many cases, the cause of this condition is not known. It may be caused by a disturbance in an area of the inner ear that helps your brain to sense movement and balance. This disturbance can be caused by a viral infection (labyrinthitis), head injury, or repetitive motion. RISK FACTORS This condition is more likely to develop in: 1. Women. 2. People who are 25 years of age or older. SYMPTOMS Symptoms of this condition usually happen when you move your head or your eyes in different directions. Symptoms may start suddenly, and they usually last for less than a minute. Symptoms may include:  Loss of balance and falling.  Feeling like you are spinning or moving.  Feeling like your surroundings are spinning or moving.  Nausea and vomiting.  Blurred vision.  Dizziness.  Involuntary eye movement (nystagmus). Symptoms can be mild and cause only slight annoyance, or they can be severe and interfere with daily life. Episodes of benign positional vertigo may return (recur) over time, and they may be triggered by certain movements. Symptoms may improve over time. DIAGNOSIS This condition is usually diagnosed by medical history and a physical exam of the head, neck, and ears. You may be referred to a health care provider who specializes in ear, nose, and throat (ENT) problems (otolaryngologist) or a provider who specializes in disorders of the nervous system (neurologist). You may have additional testing, including:  MRI.  A CT scan.  Eye movement tests. Your  health care provider may ask you to change positions quickly while he or she watches you for symptoms of benign positional vertigo, such as nystagmus. Eye movement may be tested with an electronystagmogram (ENG), caloric stimulation, the Dix-Hallpike test, or the roll test.  An electroencephalogram (EEG). This records electrical activity in your brain.  Hearing tests. TREATMENT Usually, your health care provider will treat this by moving your head in specific positions to adjust your inner ear back to normal. Surgery may be needed in severe cases, but this is rare. In some cases, benign positional vertigo may resolve on its own in 2-4 weeks. HOME CARE INSTRUCTIONS Safety  Move slowly.Avoid sudden body or head movements.  Avoid driving.  Avoid operating heavy machinery.  Avoid doing any tasks that would be dangerous to you or others if a vertigo episode would occur.  If you have trouble walking or keeping your balance, try using a cane for stability. If you feel dizzy or unstable, sit down right away.  Return to your normal activities as told by your health care provider. Ask your health care provider what activities are safe for you. General Instructions  Take over-the-counter and prescription medicines only as told by your health care provider.  Avoid certain positions or movements as told by your health care provider.  Drink enough fluid to keep your urine clear or pale yellow.  Keep all follow-up visits as told by your health care provider. This is important. SEEK MEDICAL CARE IF:  You have a fever.  Your condition gets worse or you develop new symptoms.  Your family or friends  notice any behavioral changes.  Your nausea or vomiting gets worse.  You have numbness or a "pins and needles" sensation. SEEK IMMEDIATE MEDICAL CARE IF:  You have difficulty speaking or moving.  You are always dizzy.  You faint.  You develop severe headaches.  You have weakness in your  legs or arms.  You have changes in your hearing or vision.  You develop a stiff neck.  You develop sensitivity to light.   This information is not intended to replace advice given to you by your health care provider. Make sure you discuss any questions you have with your health care provider.   Document Released: 07/13/2006 Document Revised: 06/26/2015 Document Reviewed: 01/28/2015 Elsevier Interactive Patient Education 2016 [employer]  Dizziness Dizziness is a common problem. It makes you feel unsteady or lightheaded. You may feel like you are about to pass out (faint). Dizziness can lead to injury if you stumble or fall. Anyone can get dizzy, but dizziness is more common in older adults. This condition can be caused by a number of things, including: 3. Medicines. 4. Dehydration. 5. Illness. HOME CARE Following these instructions may help with your condition: Eating and Drinking  Drink enough fluid to keep your pee (urine) clear or pale yellow. This helps to keep you from getting dehydrated. Try to drink more clear fluids, such as water.  Do not drink alcohol.  Limit how much caffeine you drink or eat if told by your doctor.  Limit how much salt you drink or eat if told by your doctor. Activity  Avoid making quick movements.  When you stand up from sitting in a chair, steady yourself until you feel okay.  In the morning, first sit up on the side of the bed. When you feel okay, stand slowly while you hold onto something. Do this until you know that your balance is fine.  Move your legs often if you need to stand in one place for a long time. Tighten and relax your muscles in your legs while you are standing.  Do not drive or use heavy machinery if you feel dizzy.  Avoid bending down if you feel dizzy. Place items in your home so that they are easy for you to reach without leaning over. Lifestyle  Do not use any tobacco products, including cigarettes, chewing tobacco,  or electronic cigarettes. If you need help quitting, ask your doctor.  Try to lower your stress level, such as with yoga or meditation. Talk with your doctor if you need help. General Instructions  Watch your dizziness for any changes.  Take medicines only as told by your doctor. Talk with your doctor if you think that your dizziness is caused by a medicine that you are taking.  Tell a friend or a family member that you are feeling dizzy. If he or she notices any changes in your behavior, have this person call your doctor.  Keep all follow-up visits as told by your doctor. This is important. GET HELP IF:  Your dizziness does not go away.  Your dizziness or light-headedness gets worse.  You feel sick to your stomach (nauseous).  You have trouble hearing.  You have new symptoms.  You are unsteady on your feet or you feel like the room is spinning. GET HELP RIGHT AWAY IF:  You throw up (vomit) or have diarrhea and are unable to eat or drink anything.  You have trouble:  Talking.  Walking.  Swallowing.  Using your arms, hands, or legs.  You feel generally weak.  You are not thinking clearly or you have trouble forming sentences. It may take a friend or family member to notice this.  You have:  Chest pain.  Pain in your belly (abdomen).  Shortness of breath.  Sweating.  Your vision changes.  You are bleeding.  You have a headache.  You have neck pain or a stiff neck.  You have a fever.   This information is not intended to replace advice given to you by your health care provider. Make sure you discuss any questions you have with your health care provider.   Document Released: 09/24/2011 Document Revised: 02/19/2015 Document Reviewed: 10/01/2014 Elsevier Interactive Patient Education 2016 [employer].   [employer] Self-Care WHAT IS THE EPLEY MANEUVER? The Epley maneuver is an exercise you can do to relieve symptoms of benign paroxysmal positional  vertigo (BPPV). This condition is often just referred to as vertigo. BPPV is caused by the movement of tiny crystals (canaliths) inside your inner ear. The accumulation and movement of canaliths in your inner ear causes a sudden spinning sensation (vertigo) when you move your head to certain positions. Vertigo usually lasts about 30 seconds. BPPV usually occurs in just one ear. If you get vertigo when you lie on your left side, you probably have BPPV in your left ear. Your health care provider can tell you which ear is involved.  BPPV may be caused by a head injury. Many people older than 50 get BPPV for unknown reasons. If you have been diagnosed with BPPV, your health care provider may teach you how to do this maneuver. BPPV is not life threatening (benign) and usually goes away in time.  WHEN SHOULD I PERFORM THE EPLEY MANEUVER? You can do this maneuver at home whenever you have symptoms of vertigo. You may do the Epley maneuver up to 3 times a day until your symptoms of vertigo go away. HOW SHOULD I DO THE EPLEY MANEUVER? 6. Sit on the edge of a bed or table with your back straight. Your legs should be extended or hanging over the edge of the bed or table.  7. Turn your head halfway toward the affected ear.  8. Lie backward quickly with your head turned until you are lying flat on your back. You may want to position a pillow under your shoulders.  9. Hold this position for 30 seconds. You may experience an attack of vertigo. This is normal. Hold this position until the vertigo stops. 10. Then turn your head to the opposite direction until your unaffected ear is facing the floor.  11. Hold this position for 30 seconds. You may experience an attack of vertigo. This is normal. Hold this position until the vertigo stops. 12. Now turn your whole body to the same side as your head. Hold for another 30 seconds.  13. You can then sit back up. ARE THERE RISKS TO THIS MANEUVER? In some cases, you may  have other symptoms (such as changes in your vision, weakness, or numbness). If you have these symptoms, stop doing the maneuver and call your health care provider. Even if doing these maneuvers relieves your vertigo, you may still have dizziness. Dizziness is the sensation of light-headedness but without the sensation of movement. Even though the Epley maneuver may relieve your vertigo, it is possible that your symptoms will return within 5 years. WHAT SHOULD I DO AFTER THIS MANEUVER? After doing the Epley maneuver, you can return to your normal activities. Ask  your doctor if there is anything you should do at home to prevent vertigo. This may include:  Sleeping with two or more pillows to keep your head elevated.  Not sleeping on the side of your affected ear.  Getting up slowly from bed.  Avoiding sudden movements during the day.  Avoiding extreme head movement, like looking up or bending over.  Wearing a cervical collar to prevent sudden head movements. WHAT SHOULD I DO IF MY SYMPTOMS GET WORSE? Call your health care provider if your vertigo gets worse. Call your provider right way if you have other symptoms, including:   Nausea.  Vomiting.  Headache.  Weakness.  Numbness.  Vision changes.   This information is not intended to replace advice given to you by your health care provider. Make sure you discuss any questions you have with your health care provider.   Document Released: 10/10/2013 Document Reviewed: 10/10/2013 Elsevier Interactive Patient Education [employer].

## 2015-07-30 NOTE — ED Provider Notes (Signed)
CSN: [id number]     Arrival date & time 07/30/15  1145 History   First MD Initiated Contact with Patient 07/30/15 1355     Chief Complaint  Patient presents with  . Dizziness   Patient is a 44 y.o. female presenting with dizziness.  Dizziness Quality:  Room spinning and vertigo Severity:  Mild Onset quality:  Gradual Duration:  3 days Timing:  Constant Progression:  Unchanged Chronicity:  New Context: bending over, head movement and standing up   Context: not with ear pain and not with loss of consciousness   Relieved by:  Being still, closing eyes and lying down Worsened by:  Turning head, movement and eye movement Associated symptoms: nausea and vomiting   Associated symptoms: no blood in stool, no chest pain, no diarrhea, no headaches, no hearing loss, no palpitations, no shortness of breath, no syncope, no vision changes and no weakness   Associated symptoms comment:  Occasional blurred vision with vertigo Risk factors: no hx of vertigo and no new medications     Past Medical History  Diagnosis Date  . GERD (gastroesophageal reflux disease)   . Sleep apnea     states had test-no cpap ordered-does snore  . Bipolar 1 disorder ([place])   . 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 Arthritis   . Migraines    Past Surgical History  Procedure Laterality Date  . Fracture surgery      rt femer/knee-plate-screws-  . Orif ankle fracture  03/16/2012    Procedure: OPEN REDUCTION INTERNAL FIXATION (ORIF) ANKLE FRACTURE;  Surgeon: Colin Rhein, MD;  Location: [facility];  Service: Orthopedics;  Laterality: Right;  right lateral malleolus fracture   History reviewed. No pertinent family history. Social History  Substance Use Topics  . Smoking status: Never Smoker   . Smokeless tobacco: None  . Alcohol Use: No     Comment: occ   OB History    No data available     Review of Systems  HENT: Negative for hearing loss.   Respiratory: Negative for  shortness of breath.   Cardiovascular: Negative for chest pain, palpitations and syncope.  Gastrointestinal: Positive for nausea and vomiting. Negative for diarrhea and blood in stool.  Neurological: Positive for dizziness. Negative for weakness and headaches.  All other systems reviewed and are negative.     Allergies  Review of patient's allergies indicates no known allergies.  Home Medications   Prior to Admission medications   Medication Sig Start Date End Date Taking? Authorizing Provider  acetaminophen (TYLENOL) 650 MG CR tablet Take 650 mg by mouth every 8 (eight) hours as needed for pain.    Historical Provider, MD  cetirizine (ZYRTEC) 10 MG tablet Take 10 mg by mouth at bedtime.    Historical Provider, MD  cyclobenzaprine (FLEXERIL) 10 MG tablet Take 1 tablet (10 mg total) by mouth 2 (two) times daily as needed for muscle spasms. 11/04/13   Ignacia Felling, PA-C  HYDROcodone-acetaminophen (NORCO/VICODIN) 5-325 MG per tablet Take 1-2 tablets by mouth every 6 (six) hours as needed for moderate pain. 02/10/15   Lajean Saver, MD  ibuprofen (ADVIL,MOTRIN) 800 MG tablet Take 1 tablet (800 mg total) by mouth 3 (three) times daily. Patient not taking: Reported on 02/10/2015 11/04/13   Ignacia Felling, PA-C  meloxicam (MOBIC) 15 MG tablet Take 1 tablet (15 mg total) by mouth daily. Patient not taking: Reported on 02/10/2015 11/12/13   Hazel Sams, PA-C  methocarbamol (ROBAXIN) 750 MG tablet Take 750 mg by mouth every 6 (six) hours as needed for muscle spasms.    Historical Provider, MD  omeprazole (PRILOSEC) 20 MG capsule Take 40 mg by mouth daily.    Historical Provider, MD   BP 132/91 mmHg  Pulse 70  Temp(Src) 97.7 F (36.5 C) (Oral)  Resp 18  SpO2 100% Physical Exam  Constitutional: She is oriented to person, place, and time. She appears well-developed and well-nourished. No distress.  Morbidly obese  HENT:  Head: Normocephalic and atraumatic.  Mouth/Throat: Oropharynx is clear  and moist.  Eyes: EOM are normal. Pupils are equal, round, and reactive to light.  Neck: Normal range of motion.  Cardiovascular: Normal rate.   No murmur heard. Pulmonary/Chest: Effort normal. No respiratory distress. She has no wheezes.  Abdominal: Soft. She exhibits no distension.  Musculoskeletal: Normal range of motion. She exhibits no edema.  Neurological: She is alert and oriented to person, place, and time. No cranial nerve deficit. She exhibits normal muscle tone. Coordination normal.  Normal width gait. Normal finger to nose.   Dix-Hallpike positive on right side > left.     Skin: Skin is warm and dry. She is not diaphoretic. No erythema.  Psychiatric: She has a normal mood and affect. Her behavior is normal. Judgment and thought content normal.  Nursing note and vitals reviewed.   ED Course  Procedures (including critical care time) Labs Review Labs Reviewed  CBC WITH DIFFERENTIAL/PLATELET - Abnormal; Notable for the following:    Hemoglobin 10.8 (*)    HCT 33.2 (*)    RDW 15.8 (*)    All other components within normal limits  BASIC METABOLIC PANEL - Abnormal; Notable for the following:    Potassium 3.4 (*)    Chloride 97 (*)    Calcium 8.7 (*)    All other components within normal limits     EKG Interpretation   Date/Time:  Tuesday July 30 2015 15:13:05 EDT Ventricular Rate:  67 PR Interval:  181 QRS Duration: 91 QT Interval:  421 QTC Calculation: 444 R Axis:   51 Text Interpretation:  Sinus rhythm ST elev, probable normal early repol  pattern no significant change since 2013 Confirmed by GOLDSTON  MD, SCOTT  ([id number]) on 07/30/2015 3:24:33 PM      MDM   Patient presents emergency department today with dizziness that is worse with head turning and movement. Patient denies any trauma. She endorses nausea and vomiting. She denies any fevers, neck stiffness, chest pain,, shortness of breath and endorses eating and drinking appropriately. Denies any rectal  bleeding. Patient denies any new medications.  Physical exam is normal for a positive Dix-Hallpike test on the right side greater than left. Placement maneuver was performed with resolution of dizziness however patient vomited one time. Patient was given Zofran and meclizine with complete resolution of her dizziness. She is able to stand and sit without dizziness. Given this we sent her home with Zofran and meclizine. She was given strict return precautions to come back to emergency department should she have worsening symptoms. Patient was in agreement with this plan was discharged home in good health.   Final diagnoses:  Vertigo, benign paroxysmal, right      Roberto Scales, MD 07/30/15 Hanna, MD 08/06/15 [phone]

## 2015-07-30 NOTE — ED Notes (Signed)
Pt is actively vomiting. RN and MD notified

## 2015-09-15 IMAGING — CR DG LUMBAR SPINE COMPLETE 4+V
5 series · 5 of 5 positions shown · non-contrast
Comparison: No priors.

CLINICAL DATA: For history of fall complaining of pain in the low
back.

EXAM:
LUMBAR SPINE - COMPLETE 4+ VIEW

[t l-spine a.p. *]
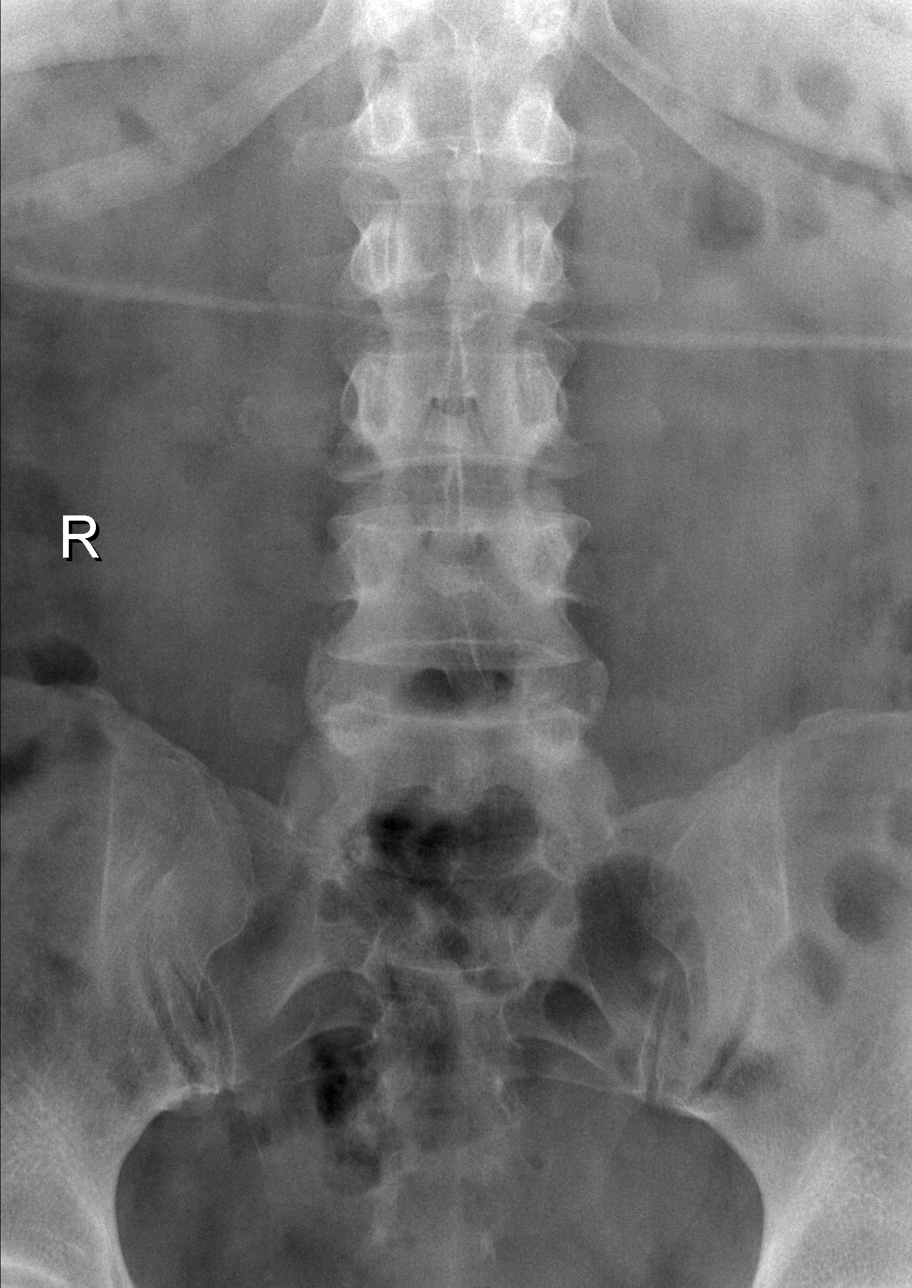

[t l-spine oblique exposure * (1 of 2)]
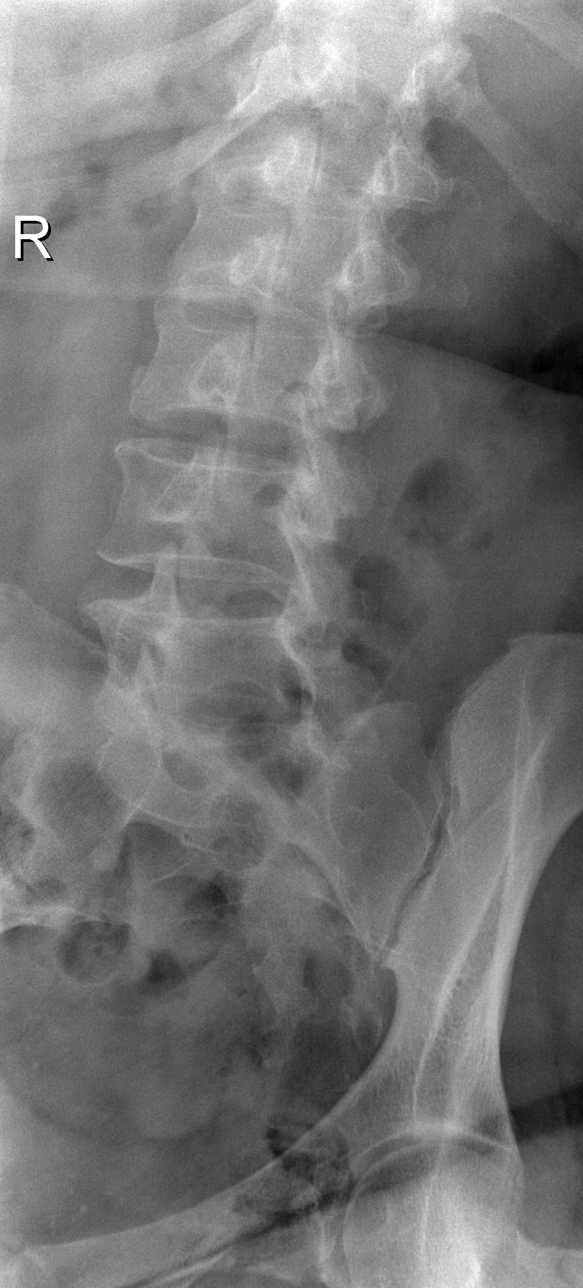

[t l-spine oblique exposure * (2 of 2)]
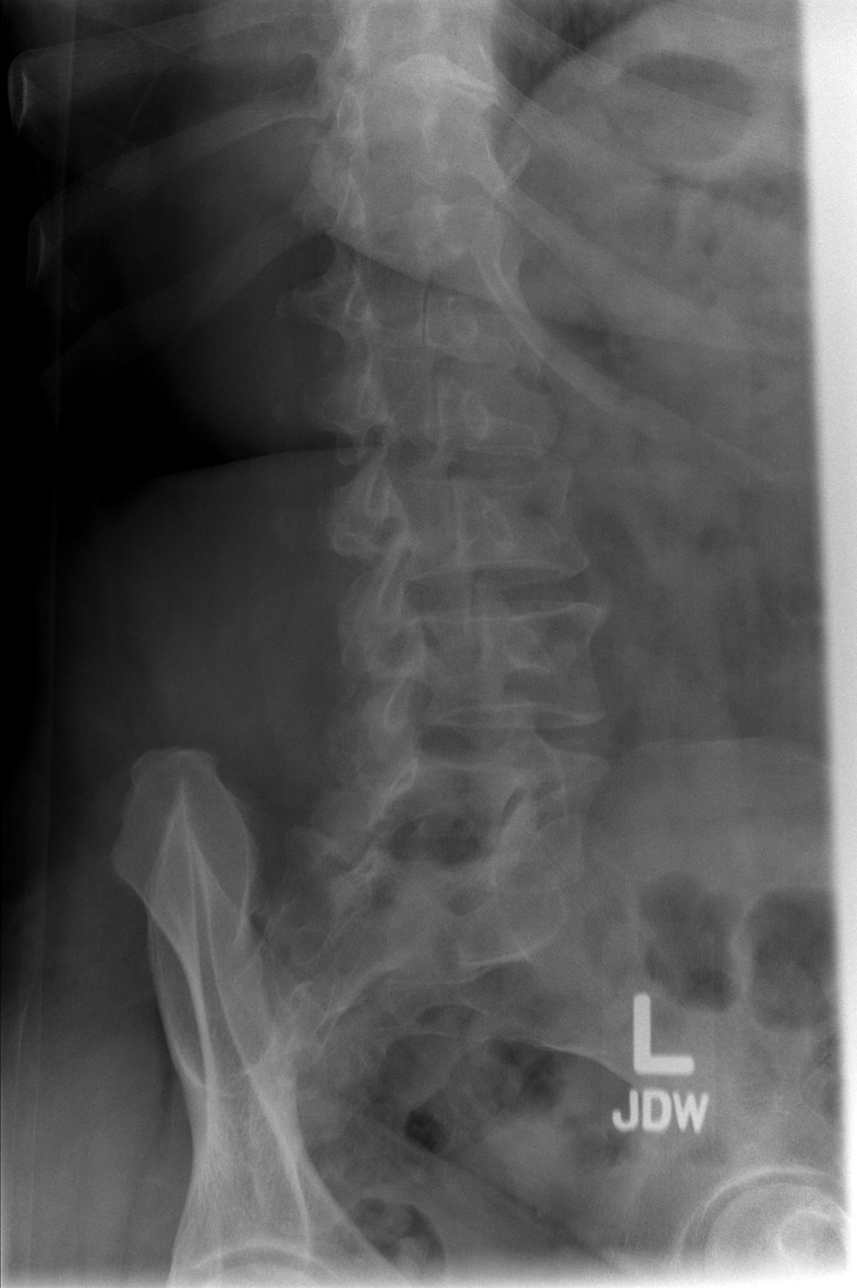

[t l-spine lat *]
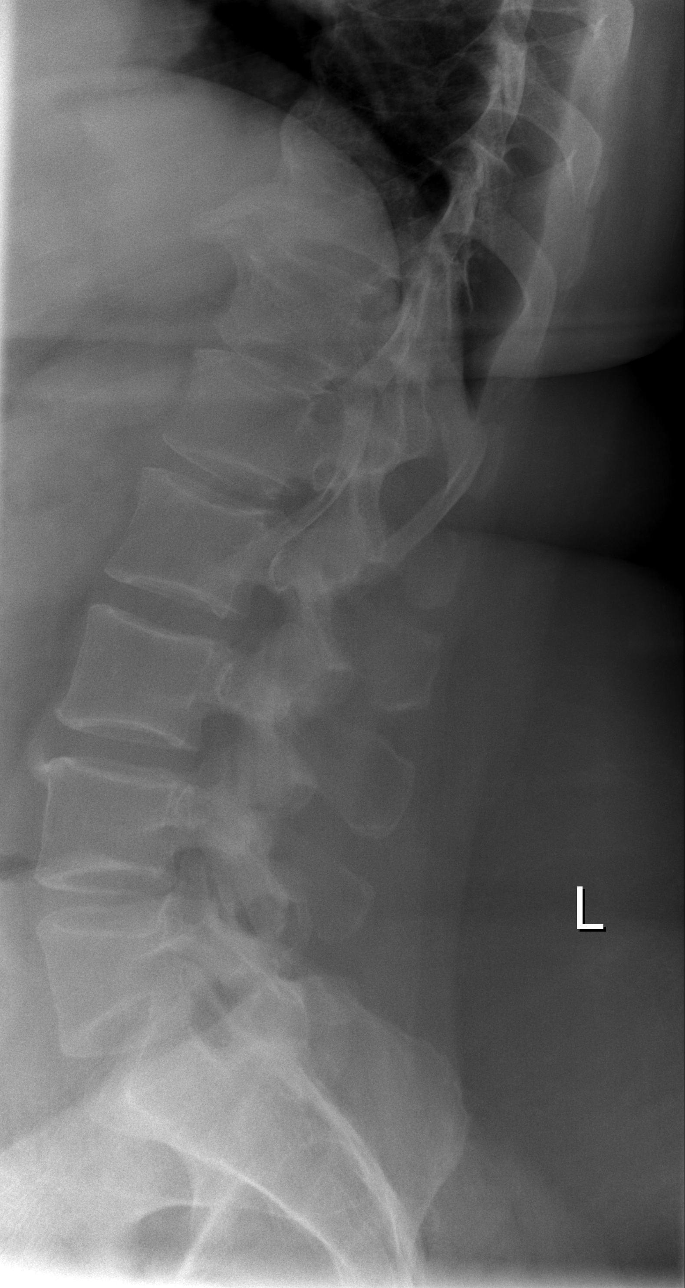

[t l-spine l5-s1 spot *]
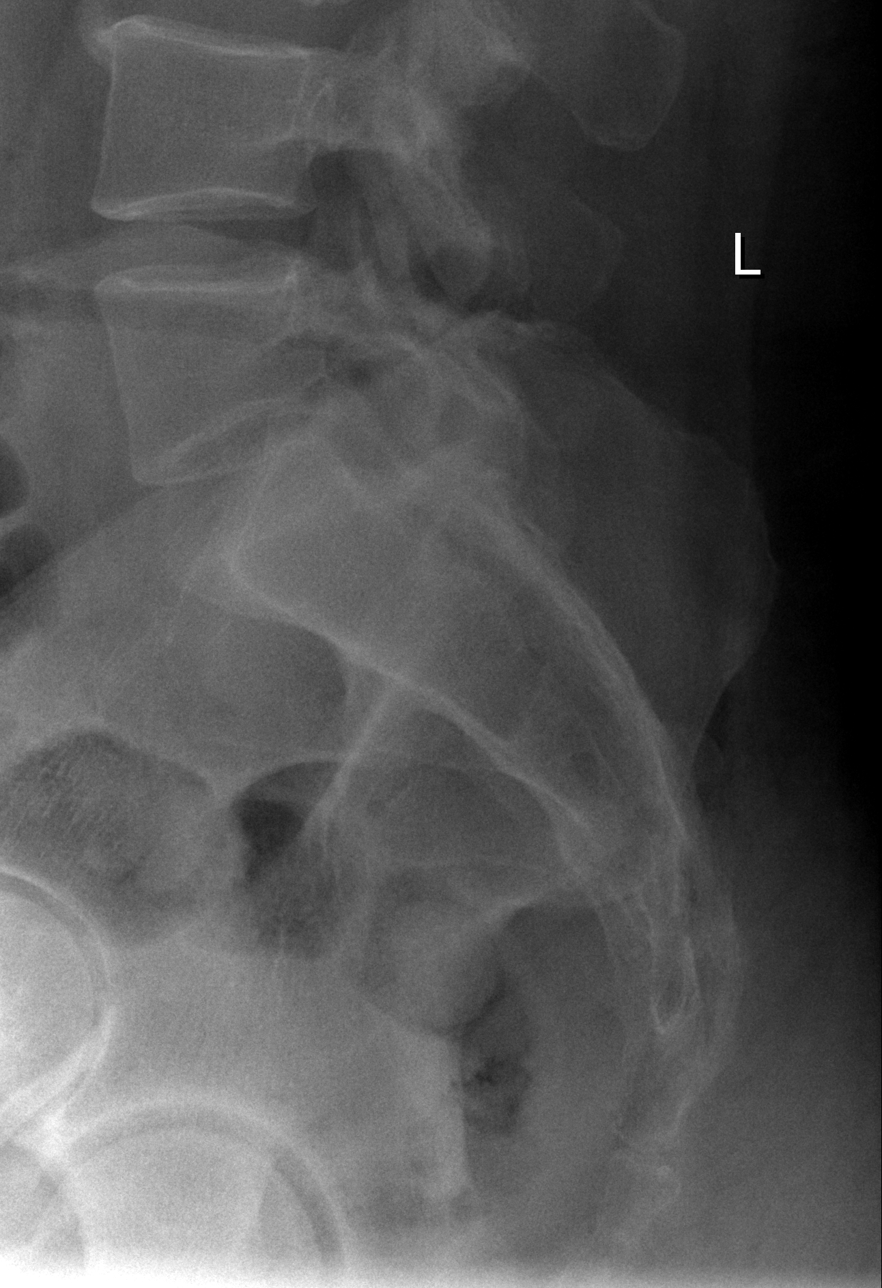

[5 of 5 positions shown; findings below may reference images not displayed]

FINDINGS: Five views of the lumbar spine demonstrate no definite acute
displaced fracture or compression type fracture. Alignment is
anatomic. Multilevel degenerative disc disease, most severe in the
lower thoracic spine, particularly at T11-T12. Multilevel facet
arthropathy. No defects of the pars interarticularis are noted.
IMPRESSION: 1. No acute radiographic abnormality of the lumbar spine.
2. Multilevel degenerative disc disease and thoracolumbar
spondylosis.

## 2015-09-15 IMAGING — CR DG HIP COMPLETE 2+V*R*
3 series · 3 of 3 positions shown · non-contrast
Comparison: No priors.

CLINICAL DATA: History of fall complaining of pain in the right
hip.

EXAM:
RIGHT HIP - COMPLETE 2+ VIEW

[t pelvis a.p.]
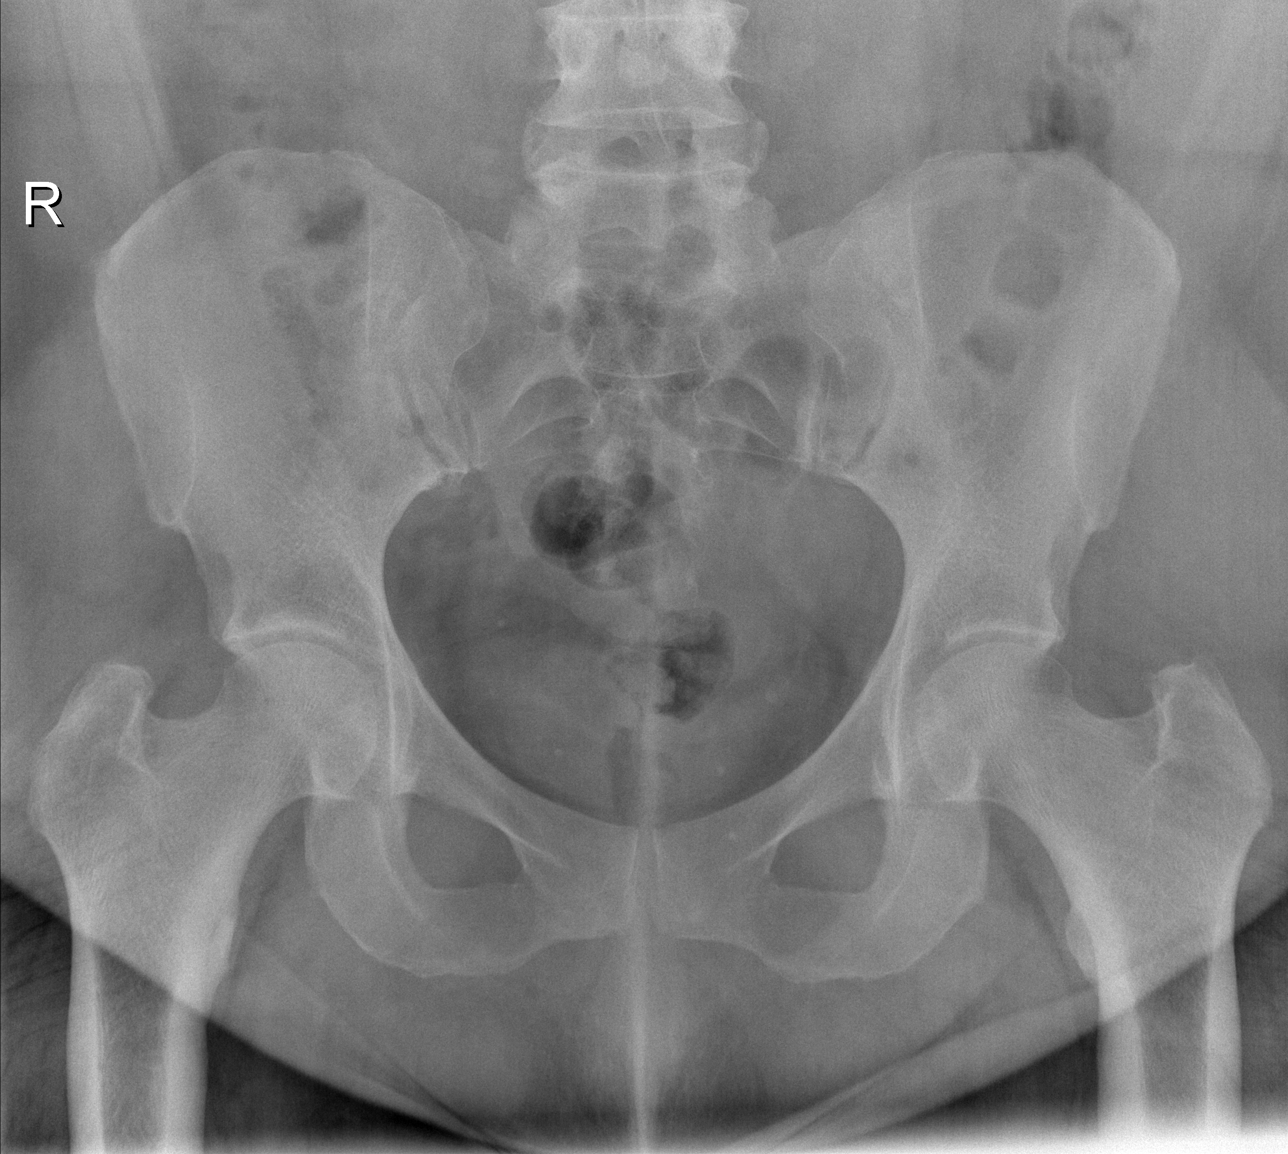

[t hip ap right *]
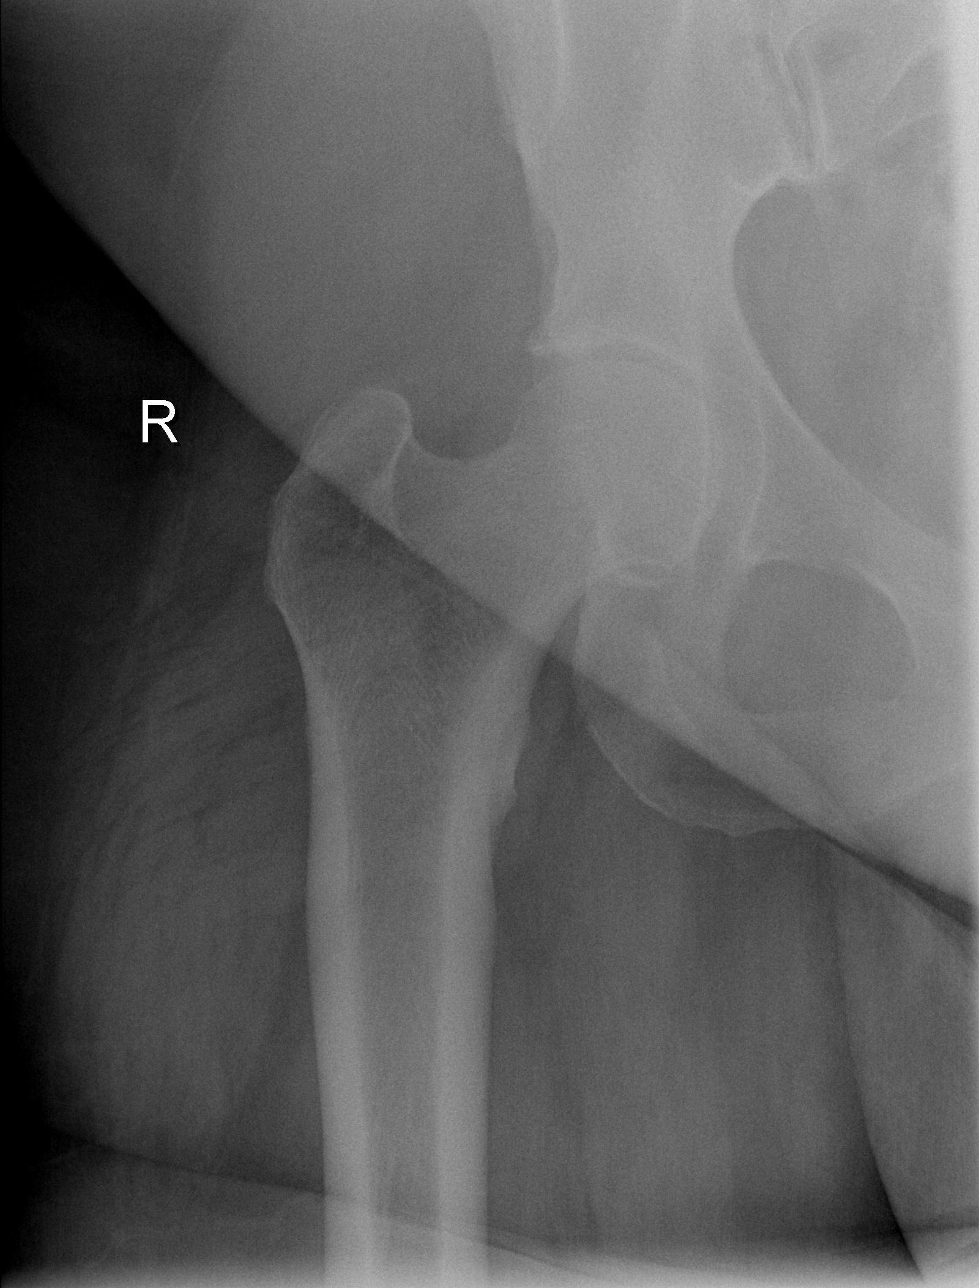

[t hip frog leg right]
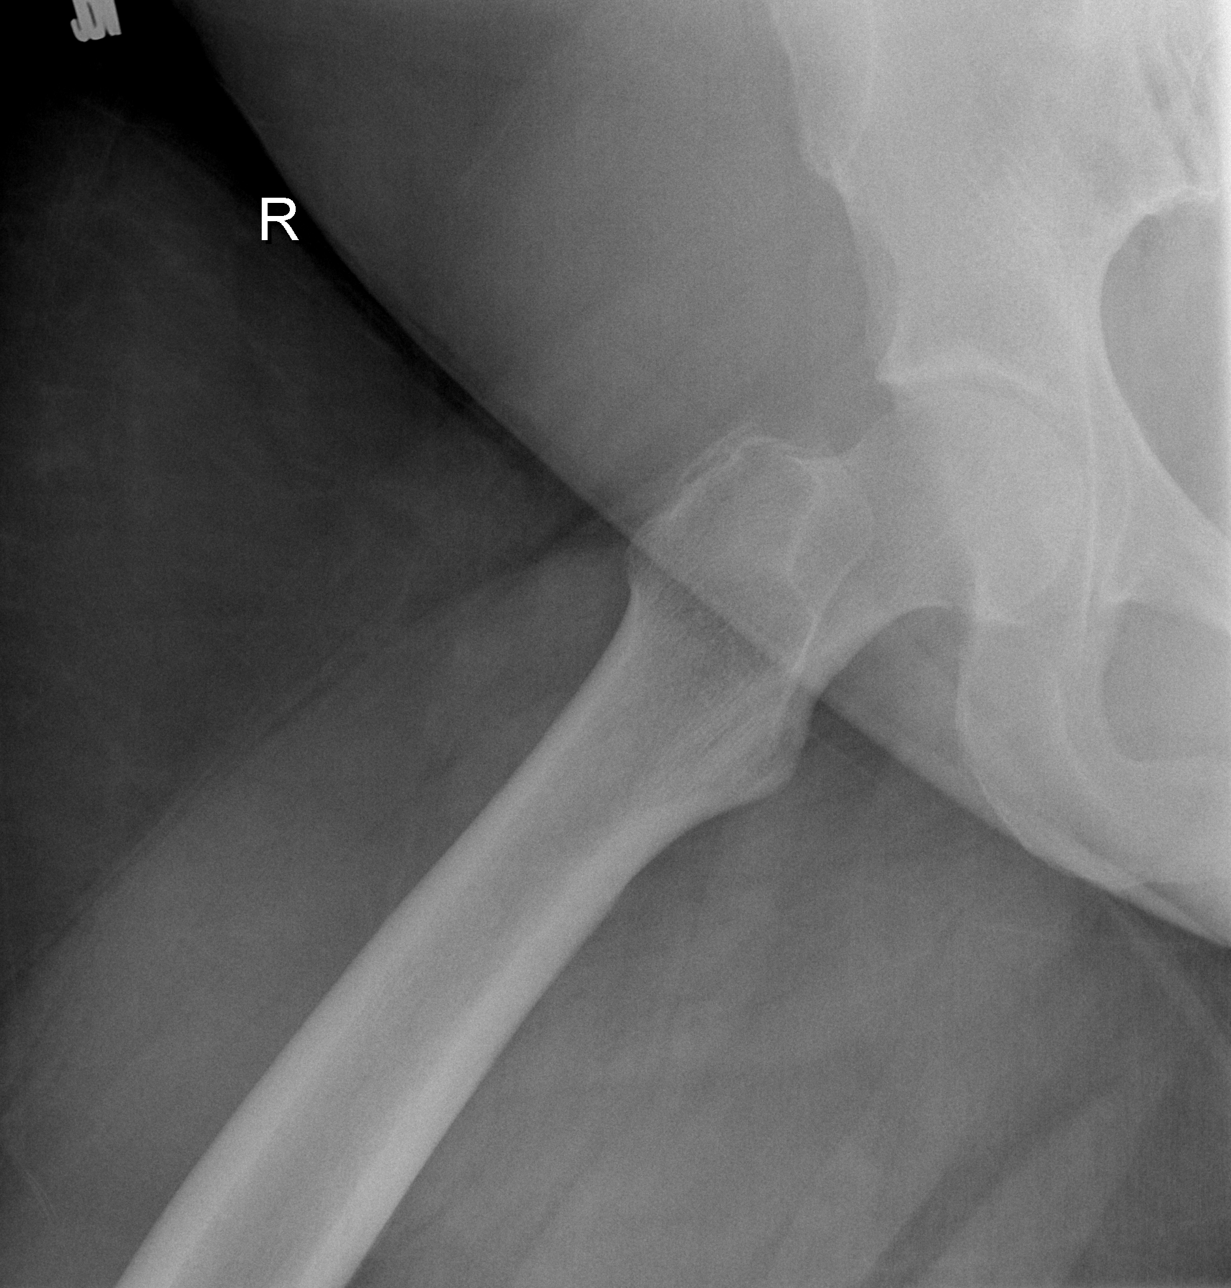

[3 of 3 positions shown; findings below may reference images not displayed]

FINDINGS: There is no evidence of hip fracture or dislocation. Mild
degenerative changes of osteoarthritis are noted in the hip joints
bilaterally.
IMPRESSION: 1. No acute radiographic abnormality of the right hip.

## 2015-11-21 ENCOUNTER — Emergency Department (HOSPITAL_COMMUNITY)
Admission: EM | Admit: 2015-11-21 | Discharge: 2015-11-21 | Disposition: A | Discharge status: Home / Self Care | Payer: Medicare HMO | Attending: Emergency Medicine | Admitting: Emergency Medicine

## 2015-11-21 ENCOUNTER — Emergency Department (HOSPITAL_COMMUNITY): Payer: Medicare HMO

## 2015-11-21 ENCOUNTER — Encounter (HOSPITAL_COMMUNITY): Payer: Self-pay | Admitting: Emergency Medicine

## 2015-11-21 DIAGNOSIS — K219 Gastro-esophageal reflux disease without esophagitis: Secondary | ICD-10-CM | POA: Insufficient documentation

## 2015-11-21 DIAGNOSIS — W231XXA Caught, crushed, jammed, or pinched between stationary objects, initial encounter: Secondary | ICD-10-CM | POA: Diagnosis not present

## 2015-11-21 DIAGNOSIS — L03011 Cellulitis of right finger: Secondary | ICD-10-CM | POA: Diagnosis not present

## 2015-11-21 DIAGNOSIS — Y9289 Other specified places as the place of occurrence of the external cause: Secondary | ICD-10-CM | POA: Diagnosis not present

## 2015-11-21 DIAGNOSIS — S62326A Displaced fracture of shaft of fifth metacarpal bone, right hand, initial encounter for closed fracture: Secondary | ICD-10-CM | POA: Insufficient documentation

## 2015-11-21 DIAGNOSIS — S62306A Unspecified fracture of fifth metacarpal bone, right hand, initial encounter for closed fracture: Secondary | ICD-10-CM

## 2015-11-21 DIAGNOSIS — G43909 Migraine, unspecified, not intractable, without status migrainosus: Secondary | ICD-10-CM | POA: Diagnosis not present

## 2015-11-21 DIAGNOSIS — M1990 Unspecified osteoarthritis, unspecified site: Secondary | ICD-10-CM | POA: Insufficient documentation

## 2015-11-21 DIAGNOSIS — Z79899 Other long term (current) drug therapy: Secondary | ICD-10-CM | POA: Diagnosis not present

## 2015-11-21 DIAGNOSIS — Y998 Other external cause status: Secondary | ICD-10-CM | POA: Diagnosis not present

## 2015-11-21 DIAGNOSIS — S6991XA Unspecified injury of right wrist, hand and finger(s), initial encounter: Secondary | ICD-10-CM | POA: Diagnosis present

## 2015-11-21 DIAGNOSIS — Z8659 Personal history of other mental and behavioral disorders: Secondary | ICD-10-CM | POA: Insufficient documentation

## 2015-11-21 DIAGNOSIS — Y9389 Activity, other specified: Secondary | ICD-10-CM | POA: Insufficient documentation

## 2015-11-21 MED ORDER — HYDROCODONE-ACETAMINOPHEN 5-325 MG PO TABS: 1.0000 | ORAL_TABLET | ORAL | Status: DC | PRN

## 2015-11-21 MED ORDER — LIDOCAINE HCL (PF) 1 % IJ SOLN
10.0000 mL | Freq: Once | INTRAMUSCULAR | Status: AC
  Administered 2015-11-21: 10 mL via INTRADERMAL
  Filled 2015-11-21: qty 10

## 2015-11-21 MED ORDER — HYDROCODONE-ACETAMINOPHEN 5-325 MG PO TABS
1.0000 | ORAL_TABLET | Freq: Once | ORAL | Status: AC
  Administered 2015-11-21: 1 via ORAL
  Filled 2015-11-21: qty 1

## 2015-11-21 NOTE — ED Provider Notes (Signed)
CSN: [id number]     Arrival date & time 11/21/15  0751 History   First MD Initiated Contact with Patient 11/21/15 [phone]     Chief Complaint  Patient presents with  . Hand Injury     (Consider location/radiation/quality/duration/timing/severity/associated sxs/prior Treatment) HPI Comments: 45yo F w/ PMH including bipolar disorder, OSA, GERD, migraines who presents with right hand pain. Patient states that approximately one week ago, she was cleaning when she thinks she must of bumped something but does not recall any specific injury. She later began having swelling and pain of her right hand near her right fifth finger. The pain is worse with any movement. She has also noticed some swelling and pain of her middle finger on that hand. She denies any fevers or recent illness.  Patient is a 45 y.o. female presenting with hand injury. The history is provided by the patient.  Hand Injury   Past Medical History  Diagnosis Date  . GERD (gastroesophageal reflux disease)   . Sleep apnea     states had test-no cpap ordered-does snore  . Bipolar 1 disorder ([place])   . 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 Arthritis   . Migraines    Past Surgical History  Procedure Laterality Date  . Fracture surgery      rt femer/knee-plate-screws-  . Orif ankle fracture  03/16/2012    Procedure: OPEN REDUCTION INTERNAL FIXATION (ORIF) ANKLE FRACTURE;  Surgeon: Colin Rhein, MD;  Location: [facility];  Service: Orthopedics;  Laterality: Right;  right lateral malleolus fracture   No family history on file. Social History  Substance Use Topics  . Smoking status: Never Smoker   . Smokeless tobacco: None  . Alcohol Use: No     Comment: occ   OB History    No data available     Review of Systems 10 Systems reviewed and are negative for acute change except as noted in the HPI.    Allergies  Review of patient's allergies indicates no known allergies.  Home Medications    Prior to Admission medications   Medication Sig Start Date End Date Taking? Authorizing Provider  acetaminophen (TYLENOL) 650 MG CR tablet Take 650 mg by mouth every 8 (eight) hours as needed for pain.   Yes Historical Provider, MD  cetirizine (ZYRTEC) 10 MG tablet Take 10 mg by mouth daily as needed for allergies.    Yes Historical Provider, MD  cyclobenzaprine (FLEXERIL) 10 MG tablet Take 1 tablet (10 mg total) by mouth 2 (two) times daily as needed for muscle spasms. 11/04/13  Yes Ignacia Felling, PA-C  hydrochlorothiazide (HYDRODIURIL) 25 MG tablet Take 25 mg by mouth daily. 07/18/15  Yes Historical Provider, MD  ibuprofen (ADVIL,MOTRIN) 200 MG tablet Take 400 mg by mouth every 6 (six) hours as needed for moderate pain.   Yes Historical Provider, MD  meclizine (ANTIVERT) 50 MG tablet Take 0.5 tablets (25 mg total) by mouth 3 (three) times daily as needed. Patient taking differently: Take 25 mg by mouth 3 (three) times daily as needed for dizziness.  07/30/15  Yes Roberto Scales, MD  omeprazole (PRILOSEC) 20 MG capsule Take 40 mg by mouth daily.   Yes Historical Provider, MD  ondansetron (ZOFRAN ODT) 4 MG disintegrating tablet Take 1 tablet (4 mg total) by mouth every 6 (six) hours as needed for nausea or vomiting. 07/30/15  Yes Roberto Scales, MD  SUMAtriptan (IMITREX) 50 MG tablet Take 50 mg by  mouth daily as needed. migraine 07/18/15  Yes Historical Provider, MD  HYDROcodone-acetaminophen (NORCO/VICODIN) 5-325 MG tablet Take 1-2 tablets by mouth every 4 (four) hours as needed for severe pain. 11/21/15   Sharlett Iles, MD  meloxicam (MOBIC) 15 MG tablet Take 1 tablet (15 mg total) by mouth daily. Patient not taking: Reported on 02/10/2015 11/12/13   Hazel Sams, PA-C  ondansetron (ZOFRAN) 4 MG tablet Take 1 tablet (4 mg total) by mouth every 8 (eight) hours as needed for nausea or vomiting. Patient not taking: Reported on 11/21/2015 07/30/15   Antony Blackbird, MD   BP 148/86 mmHg  Pulse 76   Temp(Src) 97.6 F (36.4 C) (Oral)  Resp 20  SpO2 99%  LMP 11/14/2015 Physical Exam  Constitutional: She appears well-developed and well-nourished. No distress.  HENT:  Head: Normocephalic and atraumatic.  Mouth/Throat: Oropharynx is clear and moist.  Eyes: Conjunctivae are normal. Pupils are equal, round, and reactive to light.  Neck: Neck supple.  Cardiovascular: Normal rate, regular rhythm and normal heart sounds.   No murmur heard. Pulmonary/Chest: Effort normal and breath sounds normal. No respiratory distress.  Musculoskeletal: She exhibits edema.  Edema and TTP of dorsum of R hand over 5th metacarpal; swelling of tip of R 3rd finger near radial side of nail; normal sensation R hand; 2+ radial pulse  Skin: Skin is warm and dry. No rash noted. No erythema.  Psychiatric: She has a normal mood and affect. Judgment normal.  Nursing note and vitals reviewed.   ED Course  .Marland KitchenIncision and Drainage Date/Time: 11/21/2015 9:40 AM Performed by: Sharlett Iles Authorized by: Sharlett Iles Consent: Verbal consent obtained. Consent given by: patient Patient identity confirmed: verbally with patient Type: abscess Body area: upper extremity Location details: right long finger Anesthesia: digital block Local anesthetic: lidocaine 1% without epinephrine Anesthetic total: 6 ml Scalpel size: 15 Incision type: single straight Incision depth: dermal Drainage: purulent Drainage amount: scant Wound treatment: wound left open Packing material: 1/2 in gauze Patient tolerance: Patient tolerated the procedure well with no immediate complications Comments: R middle finger paronychia   (including critical care time) Labs Review Labs Reviewed - No data to display  Imaging Review Dg Hand Complete Right  11/21/2015  CLINICAL DATA:  Direct trauma to right hand yesterday with persistent pain over the fourth and fifth metacarpal. EXAM: RIGHT HAND - COMPLETE 3+ VIEW COMPARISON:  None  in PACs FINDINGS: The patient has sustained a minimally angulated minimally distracted fracture of the distal shaft of the fifth metacarpal. The fourth metacarpal is intact as are the other metacarpals. The phalanges are intact. The joint spaces are preserved. The observed portions of the right wrist are normal. IMPRESSION: Mildly angulated fracture of the distal third of the shaft of the fifth metacarpal. Electronically Signed   By: David  Martinique M.D.   On: 11/21/2015 08:39     Medications  lidocaine (PF) (XYLOCAINE) 1 % injection 10 mL (10 mLs Intradermal Given by Other 11/21/15 0840)  HYDROcodone-acetaminophen (NORCO/VICODIN) 5-325 MG per tablet 1 tablet (1 tablet Oral Given 11/21/15 0840)    MDM   Final diagnoses:  Fracture of fifth metacarpal bone of right hand, closed, initial encounter  Paronychia, right   Pt w/ 1 week of pain and swelling of her right hand after she was cleaning. On exam, she had swelling over her fifth metacarpal and also swelling of the tip of her right third finger, consistent with paronychia. Obtained plain film and performed I&D of R  3rd nail; see procedure note for details. Patient tolerated procedure well. Plain film shows mildly angulated fracture of distal third of shaft of fifth metacarpal. Patient has no overlapping digits on grip testing and angulation appears less than 40, therefore placed in an ulnar gutter splint and instructed to follow-up with hand surgeon in one week. Provided Norco for pain and instructed on supportive care. Reviewed return precautions including signs of infection or neurovascular compromise. Patient discharged in satisfactory condition.  Sharlett Iles, MD 11/21/15 [phone]

## 2015-11-21 NOTE — ED Notes (Signed)
MD at bedside completing I+D

## 2015-11-21 NOTE — ED Notes (Signed)
Pt "jammed" fingers into something while cleaning, edema to medial right hand, possible deformity to right little finger, edema to right middle finger.

## 2015-11-26 ENCOUNTER — Emergency Department (HOSPITAL_COMMUNITY)
Admission: EM | Admit: 2015-11-26 | Discharge: 2015-11-26 | Disposition: A | Discharge status: Home / Self Care | Payer: Medicare HMO | Attending: Emergency Medicine | Admitting: Emergency Medicine

## 2015-11-26 ENCOUNTER — Encounter (HOSPITAL_COMMUNITY): Payer: Self-pay | Admitting: *Deleted

## 2015-11-26 DIAGNOSIS — G43909 Migraine, unspecified, not intractable, without status migrainosus: Secondary | ICD-10-CM | POA: Diagnosis not present

## 2015-11-26 DIAGNOSIS — Z8669 Personal history of other diseases of the nervous system and sense organs: Secondary | ICD-10-CM | POA: Insufficient documentation

## 2015-11-26 DIAGNOSIS — Z79899 Other long term (current) drug therapy: Secondary | ICD-10-CM | POA: Diagnosis not present

## 2015-11-26 DIAGNOSIS — S62308S Unspecified fracture of other metacarpal bone, sequela: Secondary | ICD-10-CM

## 2015-11-26 DIAGNOSIS — X58XXXS Exposure to other specified factors, sequela: Secondary | ICD-10-CM | POA: Insufficient documentation

## 2015-11-26 DIAGNOSIS — Z3202 Encounter for pregnancy test, result negative: Secondary | ICD-10-CM | POA: Diagnosis not present

## 2015-11-26 DIAGNOSIS — M1990 Unspecified osteoarthritis, unspecified site: Secondary | ICD-10-CM | POA: Insufficient documentation

## 2015-11-26 DIAGNOSIS — S62306S Unspecified fracture of fifth metacarpal bone, right hand, sequela: Secondary | ICD-10-CM | POA: Diagnosis not present

## 2015-11-26 DIAGNOSIS — N3000 Acute cystitis without hematuria: Secondary | ICD-10-CM | POA: Insufficient documentation

## 2015-11-26 DIAGNOSIS — R1030 Lower abdominal pain, unspecified: Secondary | ICD-10-CM | POA: Diagnosis present

## 2015-11-26 DIAGNOSIS — K219 Gastro-esophageal reflux disease without esophagitis: Secondary | ICD-10-CM | POA: Insufficient documentation

## 2015-11-26 DIAGNOSIS — Z8659 Personal history of other mental and behavioral disorders: Secondary | ICD-10-CM | POA: Insufficient documentation

## 2015-11-26 LAB — URINE MICROSCOPIC-ADD ON

## 2015-11-26 LAB — URINALYSIS, ROUTINE W REFLEX MICROSCOPIC
Bilirubin Urine: NEGATIVE
Glucose, UA: NEGATIVE mg/dL
Ketones, ur: NEGATIVE mg/dL
Nitrite: POSITIVE — AB
Protein, ur: NEGATIVE mg/dL
Specific Gravity, Urine: 1.017 (ref 1.005–1.030)
pH: 5.5 (ref 5.0–8.0)

## 2015-11-26 LAB — POC URINE PREG, ED: Preg Test, Ur: NEGATIVE

## 2015-11-26 MED ORDER — PHENAZOPYRIDINE HCL 200 MG PO TABS: 200.0000 mg | ORAL_TABLET | Freq: Three times a day (TID) | ORAL | Status: DC

## 2015-11-26 MED ORDER — PHENAZOPYRIDINE HCL 200 MG PO TABS
200.0000 mg | ORAL_TABLET | Freq: Three times a day (TID) | ORAL | Status: DC
  Administered 2015-11-26: 200 mg via ORAL
  Filled 2015-11-26: qty 1

## 2015-11-26 MED ORDER — IBUPROFEN 800 MG PO TABS: 800.0000 mg | ORAL_TABLET | Freq: Three times a day (TID) | ORAL | Status: DC

## 2015-11-26 MED ORDER — HYDROCODONE-ACETAMINOPHEN 5-325 MG PO TABS
1.0000 | ORAL_TABLET | Freq: Once | ORAL | Status: AC
  Administered 2015-11-26: 1 via ORAL
  Filled 2015-11-26: qty 1

## 2015-11-26 MED ORDER — IBUPROFEN 200 MG PO TABS
600.0000 mg | ORAL_TABLET | Freq: Once | ORAL | Status: AC
  Administered 2015-11-26: 600 mg via ORAL
  Filled 2015-11-26: qty 3

## 2015-11-26 MED ORDER — CEPHALEXIN 500 MG PO CAPS
500.0000 mg | ORAL_CAPSULE | Freq: Once | ORAL | Status: AC
  Administered 2015-11-26: 500 mg via ORAL
  Filled 2015-11-26: qty 1

## 2015-11-26 MED ORDER — CEPHALEXIN 500 MG PO CAPS: 500.0000 mg | ORAL_CAPSULE | Freq: Three times a day (TID) | ORAL | Status: DC

## 2015-11-26 NOTE — ED Notes (Signed)
Pt here for evaluation of abdominal pain x2 days.  Pt has had dark green diarrhea with this.  Pt denies any gyn symptoms with this.  Pt has frequency and reports "swelling down there". Pt also would like her right hand evaluated, she reports fracture and she reports that she had a splint and that she got it wet and had to remove it.

## 2015-11-26 NOTE — ED Provider Notes (Signed)
CSN: [id number]     Arrival date & time 11/26/15  0716 History   First MD Initiated Contact with Patient 11/26/15 [phone]     Chief Complaint  Patient presents with  . Abdominal Pain      HPI Patient presents to the emergency department with complaints of suprapubic abdominal pain since yesterday.  She states that she has some dysuria without urinary frequency.  She denies nausea vomiting.  No fever or chills.  No flank pain.  No vaginal complaints.  She also recently had a right fifth metacarpal fracture and was placed in a ulnar gutter splint.  She states this point became wet several days ago and she is requesting a new splint.  She has not seen orthopedic surgery.   Past Medical History  Diagnosis Date  . GERD (gastroesophageal reflux disease)   . Sleep apnea     states had test-no cpap ordered-does snore  . Bipolar 1 disorder ([place])   . 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 Arthritis   . Migraines    Past Surgical History  Procedure Laterality Date  . Fracture surgery      rt femer/knee-plate-screws-  . Orif ankle fracture  03/16/2012    Procedure: OPEN REDUCTION INTERNAL FIXATION (ORIF) ANKLE FRACTURE;  Surgeon: Colin Rhein, MD;  Location: [facility];  Service: Orthopedics;  Laterality: Right;  right lateral malleolus fracture   No family history on file. Social History  Substance Use Topics  . Smoking status: Never Smoker   . Smokeless tobacco: None  . Alcohol Use: No     Comment: occ   OB History    No data available     Review of Systems  All other systems reviewed and are negative.     Allergies  Review of patient's allergies indicates no known allergies.  Home Medications   Prior to Admission medications   Medication Sig Start Date End Date Taking? Authorizing Provider  acetaminophen (TYLENOL) 650 MG CR tablet Take 650 mg by mouth every 8 (eight) hours as needed for pain.   Yes Historical Provider, MD  cyclobenzaprine  (FLEXERIL) 10 MG tablet Take 1 tablet (10 mg total) by mouth 2 (two) times daily as needed for muscle spasms. 11/04/13  Yes Ignacia Felling, PA-C  hydrochlorothiazide (HYDRODIURIL) 25 MG tablet Take 25 mg by mouth daily. 07/18/15  Yes Historical Provider, MD  HYDROcodone-acetaminophen (NORCO/VICODIN) 5-325 MG tablet Take 1-2 tablets by mouth every 4 (four) hours as needed for severe pain. 11/21/15  Yes Sharlett Iles, MD  meclizine (ANTIVERT) 50 MG tablet Take 0.5 tablets (25 mg total) by mouth 3 (three) times daily as needed. Patient taking differently: Take 25 mg by mouth 3 (three) times daily as needed for dizziness.  07/30/15  Yes Roberto Scales, MD  omeprazole (PRILOSEC) 20 MG capsule Take 40 mg by mouth daily.   Yes Historical Provider, MD  ondansetron (ZOFRAN ODT) 4 MG disintegrating tablet Take 1 tablet (4 mg total) by mouth every 6 (six) hours as needed for nausea or vomiting. 07/30/15  Yes Roberto Scales, MD  SUMAtriptan (IMITREX) 50 MG tablet Take 50 mg by mouth daily as needed. migraine 07/18/15  Yes Historical Provider, MD  cephALEXin (KEFLEX) 500 MG capsule Take 1 capsule (500 mg total) by mouth 3 (three) times daily. 11/26/15   Jola Schmidt, MD  ibuprofen (ADVIL,MOTRIN) 800 MG tablet Take 1 tablet (800 mg total) by mouth 3 (three) times daily. 11/26/15  Jola Schmidt, MD  phenazopyridine (PYRIDIUM) 200 MG tablet Take 1 tablet (200 mg total) by mouth 3 (three) times daily with meals. 11/26/15   Jola Schmidt, MD   BP 136/86 mmHg  Pulse 62  Temp(Src) 98.1 F (36.7 C) (Oral)  Resp 20  SpO2 100%  LMP 11/14/2015 Physical Exam  Constitutional: She is oriented to person, place, and time. She appears well-developed and well-nourished.  HENT:  Head: Normocephalic.  Eyes: EOM are normal.  Neck: Normal range of motion.  Pulmonary/Chest: Effort normal.  Abdominal: She exhibits no distension.  Mild suprapubic tenderness without guarding or rebound.  Musculoskeletal: Normal range of motion.   Mild tenderness and swelling of the right fifth metacarpal.  No open component to this fracture.  Normal perfusion distally.  Compartments of the hand are soft  Neurological: She is alert and oriented to person, place, and time.  Psychiatric: She has a normal mood and affect.  Nursing note and vitals reviewed.   ED Course  Procedures (including critical care time)  SPLINT APPLICATION Authorized by: Hoy Morn Consent: Verbal consent obtained. Risks and benefits: risks, benefits and alternatives were discussed Consent given by: patient Splint applied by: orthopedic technician Location details: right UE Splint type: ulnar gutter Supplies used: plaster Post-procedure: The splinted body part was neurovascularly unchanged following the procedure. Patient tolerance: Patient tolerated the procedure well with no immediate complications.     Labs Review Labs Reviewed  URINALYSIS, ROUTINE W REFLEX MICROSCOPIC (NOT AT [facility]) - Abnormal; Notable for the following:    APPearance CLOUDY (*)    Hgb urine dipstick SMALL (*)    Nitrite POSITIVE (*)    Leukocytes, UA LARGE (*)    All other components within normal limits  URINE MICROSCOPIC-ADD ON - Abnormal; Notable for the following:    Squamous Epithelial / LPF 6-30 (*)    Bacteria, UA MANY (*)    All other components within normal limits  POC URINE PREG, ED    Imaging Review No results found. I have personally reviewed and evaluated these images and lab results as part of my medical decision-making.   EKG Interpretation None      MDM   Final diagnoses:  Lower abdominal pain  Acute cystitis without hematuria  Closed fracture of 5th metacarpal, sequela    Acute cystitis as the cause of her lower abdominal pain.  Patient be treated with Keflex.  Urine culture sent.  Splint replaced.  With peak follow-up.  Primary care follow-up.  Patient understands to return to the ER for new or worsening symptoms    Jola Schmidt,  MD 11/26/15 [phone]

## 2015-11-26 NOTE — ED Notes (Signed)
Attempt to call ortho tech and did not reach them, will attempt call again

## 2015-11-27 ENCOUNTER — Telehealth (HOSPITAL_COMMUNITY): Payer: Self-pay

## 2015-11-27 NOTE — Telephone Encounter (Signed)
Pt calling for referral info for  11/21/2015 ED visit.  Pt provided name and contact info for Dr Raliegh Ip. Fredna Dow.

## 2015-11-28 LAB — URINE CULTURE: Culture: >=100000

## 2015-11-29 ENCOUNTER — Telehealth (HOSPITAL_BASED_OUTPATIENT_CLINIC_OR_DEPARTMENT_OTHER): Payer: Self-pay | Admitting: Emergency Medicine

## 2015-11-29 NOTE — Telephone Encounter (Signed)
Post ED Visit - Positive Culture Follow-up  Culture report reviewed by antimicrobial stewardship pharmacist:  [x]  Elenor Quinones, Pharm.D. []  Heide Guile, Pharm.D., BCPS []  Parks Neptune, Pharm.D. []  Alycia Rossetti, Pharm.D., BCPS []  [place], Pharm.D., BCPS, AAHIVP []  Legrand Como, Pharm.D., BCPS, AAHIVP []  Milus Glazier, Pharm.D. []  Rob Evette Doffing, Pharm.D.  Positive urine culture E. coli Treated with Cephalexin, organism sensitive to the same and no further patient follow-up is required at this time.  Hazle Nordmann 11/29/2015, 9:21 AM

## 2015-12-13 DIAGNOSIS — S62366D Nondisplaced fracture of neck of fifth metacarpal bone, right hand, subsequent encounter for fracture with routine healing: Secondary | ICD-10-CM | POA: Insufficient documentation

## 2016-01-10 ENCOUNTER — Emergency Department (HOSPITAL_COMMUNITY)
Admission: EM | Admit: 2016-01-10 | Discharge: 2016-01-10 | Disposition: A | Discharge status: Home / Self Care | Payer: Medicare HMO | Attending: Emergency Medicine | Admitting: Emergency Medicine

## 2016-01-10 ENCOUNTER — Emergency Department (HOSPITAL_COMMUNITY): Payer: Medicare HMO

## 2016-01-10 ENCOUNTER — Encounter (HOSPITAL_COMMUNITY): Payer: Self-pay | Admitting: Emergency Medicine

## 2016-01-10 DIAGNOSIS — R0981 Nasal congestion: Secondary | ICD-10-CM | POA: Insufficient documentation

## 2016-01-10 DIAGNOSIS — R05 Cough: Secondary | ICD-10-CM | POA: Diagnosis not present

## 2016-01-10 DIAGNOSIS — Z8669 Personal history of other diseases of the nervous system and sense organs: Secondary | ICD-10-CM | POA: Diagnosis not present

## 2016-01-10 DIAGNOSIS — R0602 Shortness of breath: Secondary | ICD-10-CM | POA: Diagnosis not present

## 2016-01-10 DIAGNOSIS — Z79899 Other long term (current) drug therapy: Secondary | ICD-10-CM | POA: Diagnosis not present

## 2016-01-10 DIAGNOSIS — R0789 Other chest pain: Secondary | ICD-10-CM | POA: Diagnosis not present

## 2016-01-10 DIAGNOSIS — R509 Fever, unspecified: Secondary | ICD-10-CM | POA: Diagnosis present

## 2016-01-10 DIAGNOSIS — R51 Headache: Secondary | ICD-10-CM | POA: Diagnosis not present

## 2016-01-10 DIAGNOSIS — Z8659 Personal history of other mental and behavioral disorders: Secondary | ICD-10-CM | POA: Insufficient documentation

## 2016-01-10 DIAGNOSIS — J029 Acute pharyngitis, unspecified: Secondary | ICD-10-CM | POA: Diagnosis not present

## 2016-01-10 DIAGNOSIS — R112 Nausea with vomiting, unspecified: Secondary | ICD-10-CM | POA: Insufficient documentation

## 2016-01-10 DIAGNOSIS — M1990 Unspecified osteoarthritis, unspecified site: Secondary | ICD-10-CM | POA: Insufficient documentation

## 2016-01-10 DIAGNOSIS — Z792 Long term (current) use of antibiotics: Secondary | ICD-10-CM | POA: Diagnosis not present

## 2016-01-10 DIAGNOSIS — K219 Gastro-esophageal reflux disease without esophagitis: Secondary | ICD-10-CM | POA: Diagnosis not present

## 2016-01-10 DIAGNOSIS — Z791 Long term (current) use of non-steroidal anti-inflammatories (NSAID): Secondary | ICD-10-CM | POA: Insufficient documentation

## 2016-01-10 DIAGNOSIS — R6889 Other general symptoms and signs: Secondary | ICD-10-CM

## 2016-01-10 LAB — CBC WITH DIFFERENTIAL/PLATELET
Basophils Absolute: 0 10*3/uL (ref 0.0–0.1)
Basophils Relative: 1 %
Eosinophils Absolute: 0 10*3/uL (ref 0.0–0.7)
Eosinophils Relative: 0 %
HCT: 32.7 % — ABNORMAL LOW (ref 36.0–46.0)
Hemoglobin: 10.6 g/dL — ABNORMAL LOW (ref 12.0–15.0)
Lymphocytes Relative: 50 %
Lymphs Abs: 1.9 10*3/uL (ref 0.7–4.0)
MCH: 26.8 pg (ref 26.0–34.0)
MCHC: 32.4 g/dL (ref 30.0–36.0)
MCV: 82.8 fL (ref 78.0–100.0)
Monocytes Absolute: 0.4 10*3/uL (ref 0.1–1.0)
Monocytes Relative: 11 %
Neutro Abs: 1.4 10*3/uL — ABNORMAL LOW (ref 1.7–7.7)
Neutrophils Relative %: 38 %
Platelets: 283 10*3/uL (ref 150–400)
RBC: 3.95 MIL/uL (ref 3.87–5.11)
RDW: 16.6 % — ABNORMAL HIGH (ref 11.5–15.5)
WBC: 3.7 10*3/uL — ABNORMAL LOW (ref 4.0–10.5)

## 2016-01-10 LAB — COMPREHENSIVE METABOLIC PANEL
ALT: 13 U/L — ABNORMAL LOW (ref 14–54)
AST: 23 U/L (ref 15–41)
Albumin: 3.3 g/dL — ABNORMAL LOW (ref 3.5–5.0)
Alkaline Phosphatase: 54 U/L (ref 38–126)
Anion gap: 9 (ref 5–15)
BUN: 9 mg/dL (ref 6–20)
CO2: 24 mmol/L (ref 22–32)
Calcium: 8.1 mg/dL — ABNORMAL LOW (ref 8.9–10.3)
Chloride: 103 mmol/L (ref 101–111)
Creatinine, Ser: 1.1 mg/dL — ABNORMAL HIGH (ref 0.44–1.00)
GFR calc Af Amer: >60 mL/min (ref >60)
GFR calc non Af Amer: 59 mL/min — ABNORMAL LOW (ref >60)
Glucose, Bld: 105 mg/dL — ABNORMAL HIGH (ref 65–99)
Potassium: 3.7 mmol/L (ref 3.5–5.1)
Sodium: 136 mmol/L (ref 135–145)
Total Bilirubin: 0.6 mg/dL (ref 0.3–1.2)
Total Protein: 8 g/dL (ref 6.5–8.1)

## 2016-01-10 LAB — I-STAT CG4 LACTIC ACID, ED
Lactic Acid, Venous: 0.85 mmol/L (ref 0.5–2.0)
Lactic Acid, Venous: 2.02 mmol/L (ref 0.5–2.0)

## 2016-01-10 LAB — LIPASE, BLOOD: Lipase: 59 U/L — ABNORMAL HIGH (ref 11–51)

## 2016-01-10 MED ORDER — ONDANSETRON 8 MG PO TBDP: 8.0000 mg | ORAL_TABLET | Freq: Three times a day (TID) | ORAL | Status: DC | PRN

## 2016-01-10 MED ORDER — KETOROLAC TROMETHAMINE 30 MG/ML IJ SOLN
30.0000 mg | Freq: Once | INTRAMUSCULAR | Status: AC
  Administered 2016-01-10: 30 mg via INTRAVENOUS
  Filled 2016-01-10: qty 1

## 2016-01-10 MED ORDER — HYDROCOD POLST-CPM POLST ER 10-8 MG/5ML PO SUER
5.0000 mL | Freq: Once | ORAL | Status: AC
  Administered 2016-01-10: 5 mL via ORAL
  Filled 2016-01-10: qty 5

## 2016-01-10 MED ORDER — FAMOTIDINE 20 MG PO TABS: 20.0000 mg | ORAL_TABLET | Freq: Two times a day (BID) | ORAL | Status: DC

## 2016-01-10 MED ORDER — IBUPROFEN 600 MG PO TABS: 600.0000 mg | ORAL_TABLET | Freq: Four times a day (QID) | ORAL | Status: DC | PRN

## 2016-01-10 MED ORDER — GUAIFENESIN-DM 100-10 MG/5ML PO SYRP: 5.0000 mL | ORAL_SOLUTION | ORAL | Status: DC | PRN

## 2016-01-10 MED ORDER — SODIUM CHLORIDE 0.9 % IV BOLUS (SEPSIS)
1000.0000 mL | Freq: Once | INTRAVENOUS | Status: AC
Start: 2016-01-10 — End: 2016-01-10
  Administered 2016-01-10: 1000 mL via INTRAVENOUS

## 2016-01-10 MED ORDER — ALBUTEROL SULFATE HFA 108 (90 BASE) MCG/ACT IN AERS
2.0000 | INHALATION_SPRAY | Freq: Once | RESPIRATORY_TRACT | Status: AC
  Administered 2016-01-10: 2 via RESPIRATORY_TRACT
  Filled 2016-01-10: qty 6.7

## 2016-01-10 MED ORDER — PSEUDOEPHEDRINE HCL 30 MG PO TABS: 30.0000 mg | ORAL_TABLET | ORAL | Status: DC | PRN

## 2016-01-10 MED ORDER — ACETAMINOPHEN 325 MG PO TABS
650.0000 mg | ORAL_TABLET | Freq: Once | ORAL | Status: AC
  Administered 2016-01-10: 650 mg via ORAL
  Filled 2016-01-10: qty 2

## 2016-01-10 MED ORDER — GI COCKTAIL ~~LOC~~
30.0000 mL | Freq: Once | ORAL | Status: AC
  Administered 2016-01-10: 30 mL via ORAL
  Filled 2016-01-10: qty 30

## 2016-01-10 NOTE — ED Notes (Signed)
Ramond Marrow RN attempted [place] IV not successful. Natalie RN attempting [place]. EDPA updated on pt current status

## 2016-01-10 NOTE — ED Notes (Signed)
ED PA at bedside

## 2016-01-10 NOTE — ED Notes (Signed)
Patient transported to X-ray 

## 2016-01-10 NOTE — ED Notes (Signed)
Pt c/o cough, SOB, fever, generalized body aches, loss of appetite, and emesis since Monday. Pt has been using OTC medication and her muscle relaxer without relief. Pt states she's coughing up dark mucus, non-productive cough witnessed in triage.

## 2016-01-10 NOTE — ED Notes (Signed)
Attempt x 2 IV not successful. Estill Bamberg EMT attempting lab draw. Ramond Marrow RN attempting IV

## 2016-01-10 NOTE — Discharge Instructions (Signed)
Take ibuprofen and tylenol for body aches and fever. Use inhaler 2 puffs every 4 hrs. Take sudafed for congestion as prescribed. Robitussin DM for cough. zofran for nausea as needed. pepcid for acid reflux. Follow up with your doctor. Return if worsening symptoms.   Influenza, Adult Influenza ("the flu") is a viral infection of the respiratory tract. It occurs more often in winter months because people spend more time in close contact with one another. Influenza can make you feel very sick. Influenza easily spreads from person to person (contagious). CAUSES  Influenza is caused by a virus that infects the respiratory tract. You can catch the virus by breathing in droplets from an infected person's cough or sneeze. You can also catch the virus by touching something that was recently contaminated with the virus and then touching your mouth, nose, or eyes. RISKS AND COMPLICATIONS You may be at risk for a more severe case of influenza if you smoke cigarettes, have diabetes, have chronic heart disease (such as heart failure) or lung disease (such as asthma), or if you have a weakened immune system. Elderly people and pregnant women are also at risk for more serious infections. The most common problem of influenza is a lung infection (pneumonia). Sometimes, this problem can require emergency medical care and may be life threatening. SIGNS AND SYMPTOMS  Symptoms typically last 4 to 10 days and may include:  Fever.  Chills.  Headache, body aches, and muscle aches.  Sore throat.  Chest discomfort and cough.  Poor appetite.  Weakness or feeling tired.  Dizziness.  Nausea or vomiting. DIAGNOSIS  Diagnosis of influenza is often made based on your history and a physical exam. A nose or throat swab test can be done to confirm the diagnosis. TREATMENT  In mild cases, influenza goes away on its own. Treatment is directed at relieving symptoms. For more severe cases, your health care provider may  prescribe antiviral medicines to shorten the sickness. Antibiotic medicines are not effective because the infection is caused by a virus, not by bacteria. HOME CARE INSTRUCTIONS  Take medicines only as directed by your health care provider.  Use a cool mist humidifier to make breathing easier.  Get plenty of rest until your temperature returns to normal. This usually takes 3 to 4 days.  Drink enough fluid to keep your urine clear or pale yellow.  Cover yourmouth and nosewhen coughing or sneezing,and wash your handswellto prevent thevirusfrom spreading.  Stay homefromwork orschool untilthe fever is gonefor at least 57full day. PREVENTION  An annual influenza vaccination (flu shot) is the best way to avoid getting influenza. An annual flu shot is now routinely recommended for all adults in the [place] IF:  You experiencechest pain, yourcough worsens,or you producemore mucus.  Youhave nausea,vomiting, ordiarrhea.  Your fever returns or gets worse. SEEK IMMEDIATE MEDICAL CARE IF:  You havetrouble breathing, you become short of breath,or your skin ornails becomebluish.  You have severe painor stiffnessin the neck.  You develop a sudden headache, or pain in the face or ear.  You have nausea or vomiting that you cannot control. MAKE SURE YOU:   Understand these instructions.  Will watch your condition.  Will get help right away if you are not doing well or get worse.   This information is not intended to replace advice given to you by your health care provider. Make sure you discuss any questions you have with your health care provider.   Document Released: 10/02/2000 Document Revised:  10/26/2014 Document Reviewed: 01/04/2012 Elsevier Interactive Patient Education [employer].

## 2016-01-10 NOTE — ED Notes (Signed)
Per PA, Pt to be discharged after fluid bolus.

## 2016-01-10 NOTE — ED Provider Notes (Signed)
CSN: [id number]     Arrival date & time 01/10/16  [id number] History   First MD Initiated Contact with Patient 01/10/16 [phone]     Chief Complaint  Patient presents with  . Shortness of Breath  . Fever  . Emesis     (Consider location/radiation/quality/duration/timing/severity/associated sxs/prior Treatment) HPI Danielle [name] is a 45 y.o. female with history of acid reflux, bipolar disorder, migraines, morbid obesity, presents to emergency department complaining of body aches, fever, chills, sore throat, nasal congestion, cough, nausea and vomiting. Patient states her symptoms started 5 days ago. She reports  she has been taking over-the-counter cold and flu medications which have not been helping. She has also been taking Flexeril for her body aches. She states that her cough is productive with brown sputum. She has not checked her temperature at home but states that she knows she has had a fever, and reports body aches and chills. She reports night sweats. She states she has had several episodes of emesis. She states she feels weak and dehydrated. She has been able to keep some fluids down and states she has been sipping on ginger ale at home. She denies any recent sick contacts. Patient states "I need to be admitted for a couple of days."  Past Medical History  Diagnosis Date  . GERD (gastroesophageal reflux disease)   . Sleep apnea     states had test-no cpap ordered-does snore  . Bipolar 1 disorder ([place])   . 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 Arthritis   . Migraines    Past Surgical History  Procedure Laterality Date  . Fracture surgery      rt femer/knee-plate-screws-  . Orif ankle fracture  03/16/2012    Procedure: OPEN REDUCTION INTERNAL FIXATION (ORIF) ANKLE FRACTURE;  Surgeon: Colin Rhein, MD;  Location: [facility];  Service: Orthopedics;  Laterality: Right;  right lateral malleolus fracture   History reviewed. No pertinent family  history. Social History  Substance Use Topics  . Smoking status: Never Smoker   . Smokeless tobacco: None  . Alcohol Use: No     Comment: occ   OB History    No data available     Review of Systems  Constitutional: Positive for fever and chills.  HENT: Positive for congestion and sore throat. Negative for trouble swallowing and voice change.   Respiratory: Positive for cough, chest tightness and shortness of breath.   Cardiovascular: Negative for chest pain, palpitations and leg swelling.  Gastrointestinal: Positive for nausea and vomiting. Negative for abdominal pain and diarrhea.  Genitourinary: Negative for dysuria, flank pain, vaginal bleeding, vaginal discharge, vaginal pain and pelvic pain.  Musculoskeletal: Negative for myalgias, arthralgias, neck pain and neck stiffness.  Skin: Negative for rash.  Neurological: Positive for headaches. Negative for dizziness, weakness and light-headedness.  All other systems reviewed and are negative.     Allergies  Review of patient's allergies indicates no known allergies.  Home Medications   Prior to Admission medications   Medication Sig Start Date End Date Taking? Authorizing Provider  acetaminophen (TYLENOL) 650 MG CR tablet Take 650 mg by mouth every 8 (eight) hours as needed for pain.    Historical Provider, MD  cephALEXin (KEFLEX) 500 MG capsule Take 1 capsule (500 mg total) by mouth 3 (three) times daily. 11/26/15   Jola Schmidt, MD  cyclobenzaprine (FLEXERIL) 10 MG tablet Take 1 tablet (10 mg total) by mouth 2 (two) times daily  as needed for muscle spasms. 11/04/13   Ignacia Felling, PA-C  hydrochlorothiazide (HYDRODIURIL) 25 MG tablet Take 25 mg by mouth daily. 07/18/15   Historical Provider, MD  HYDROcodone-acetaminophen (NORCO/VICODIN) 5-325 MG tablet Take 1-2 tablets by mouth every 4 (four) hours as needed for severe pain. 11/21/15   Sharlett Iles, MD  ibuprofen (ADVIL,MOTRIN) 800 MG tablet Take 1 tablet (800 mg total) by  mouth 3 (three) times daily. 11/26/15   Jola Schmidt, MD  meclizine (ANTIVERT) 50 MG tablet Take 0.5 tablets (25 mg total) by mouth 3 (three) times daily as needed. Patient taking differently: Take 25 mg by mouth 3 (three) times daily as needed for dizziness.  07/30/15   Roberto Scales, MD  omeprazole (PRILOSEC) 20 MG capsule Take 40 mg by mouth daily.    Historical Provider, MD  ondansetron (ZOFRAN ODT) 4 MG disintegrating tablet Take 1 tablet (4 mg total) by mouth every 6 (six) hours as needed for nausea or vomiting. 07/30/15   Roberto Scales, MD  phenazopyridine (PYRIDIUM) 200 MG tablet Take 1 tablet (200 mg total) by mouth 3 (three) times daily with meals. 11/26/15   Jola Schmidt, MD  SUMAtriptan (IMITREX) 50 MG tablet Take 50 mg by mouth daily as needed. migraine 07/18/15   Historical Provider, MD   BP 109/97 mmHg  Pulse 100  Temp(Src) 100 F (37.8 C) (Oral)  Resp 20  SpO2 97% Physical Exam  Constitutional: She is oriented to person, place, and time. She appears well-developed and well-nourished. No distress.  Morbidly obese  HENT:  Head: Normocephalic.  Right Ear: Tympanic membrane, external ear and ear canal normal.  Left Ear: Tympanic membrane, external ear and ear canal normal.  Nose: Mucosal edema and rhinorrhea present.  Mouth/Throat: Uvula is midline and mucous membranes are normal. Posterior oropharyngeal erythema present. No oropharyngeal exudate, posterior oropharyngeal edema or tonsillar abscesses.  Eyes: Conjunctivae are normal.  Neck: Neck supple.  No meningismus  Cardiovascular: Normal rate, regular rhythm and normal heart sounds.   Pulmonary/Chest: Effort normal and breath sounds normal. No respiratory distress. She has no wheezes. She has no rales.  Abdominal: Soft. Bowel sounds are normal. She exhibits no distension. There is no tenderness. There is no rebound.  Musculoskeletal: She exhibits no edema.  Neurological: She is alert and oriented to person, place, and time.   Skin: Skin is warm and dry.  Psychiatric: She has a normal mood and affect. Her behavior is normal.  Nursing note and vitals reviewed.   ED Course  Procedures (including critical care time) Labs Review Labs Reviewed  CBC WITH DIFFERENTIAL/PLATELET - Abnormal; Notable for the following:    WBC 3.7 (*)    Hemoglobin 10.6 (*)    HCT 32.7 (*)    RDW 16.6 (*)    Neutro Abs 1.4 (*)    All other components within normal limits  COMPREHENSIVE METABOLIC PANEL - Abnormal; Notable for the following:    Glucose, Bld 105 (*)    Creatinine, Ser 1.10 (*)    Calcium 8.1 (*)    Albumin 3.3 (*)    ALT 13 (*)    GFR calc non Af Amer 59 (*)    All other components within normal limits  LIPASE, BLOOD - Abnormal; Notable for the following:    Lipase 59 (*)    All other components within normal limits  I-STAT CG4 LACTIC ACID, ED - Abnormal; Notable for the following:    Lactic Acid, Venous 2.02 (*)    All  other components within normal limits  I-STAT CG4 LACTIC ACID, ED    Imaging Review Dg Chest 2 View  01/10/2016  CLINICAL DATA:  Cough and congestion with shortness of breath for 4 days. Fever for 1 day EXAM: CHEST  2 VIEW COMPARISON:  February 10, 2015 FINDINGS: Lungs are clear. Heart is upper normal in size with pulmonary vascularity within normal limits. No adenopathy. There is degenerative change in the thoracic spine. IMPRESSION: No edema or consolidation. Electronically Signed   By: Lowella Grip III M.D.   On: 01/10/2016 10:11   I have personally reviewed and evaluated these images and lab results as part of my medical decision-making.   EKG Interpretation None      MDM   Final diagnoses:  Flu-like symptoms   Patient with flulike symptoms, cough, congestion, sore throat, fever. Temperatures. Is 100 orally. Heart rate is 100, blood pressure normal respiratory rate normal. Patient reports some vomiting. Will hydrate, try Toradol, Tylenol for fever and pain. Will get chest x-ray to  rule out pneumonia.  11:34 AM Patient received 1 L of normal saline. Temperatures down to 99 with medications. Vital signs are normal at this time including heart rate down to 81. Patient states she feels better. Chest x-ray does not show any evidence of pneumonia. Labs unremarkable. Patient denies any urinary or vaginal symptoms. No abdominal pain. Most likely viral URI versus influenza. Home with symptomatic treatment. Patient is out of the window for Tamiflu. In [place] given to take home. Will treat with sudafed, ibuprofen/tylenol. Robitussin dm, pt asking for something for acid reflux, will give pepcid. FU with pcp. Return precautions discussed.   Filed Vitals:   01/10/16 1018 01/10/16 1032 01/10/16 1033 01/10/16 1108  BP:  117/83 117/83 112/69  Pulse: 86  81 92  Temp:   99 F (37.2 C)   TempSrc:   Oral   Resp:   18 18  SpO2: 99%  97% 94%     Jeannett Senior, PA-C 01/10/16 [place], MD 01/10/16 1612

## 2016-01-10 NOTE — ED Notes (Signed)
RN and PA notified of pt's Lactic Acid of 2.02

## 2016-03-10 ENCOUNTER — Ambulatory Visit (HOSPITAL_COMMUNITY): Admission: RE | Admit: 2016-03-10 | Payer: Medicare HMO | Source: Home / Self Care | Admitting: Psychiatry

## 2016-05-13 ENCOUNTER — Emergency Department (HOSPITAL_COMMUNITY): Payer: Medicare HMO

## 2016-05-13 ENCOUNTER — Emergency Department (HOSPITAL_COMMUNITY)
Admission: EM | Admit: 2016-05-13 | Discharge: 2016-05-13 | Disposition: A | Discharge status: Home / Self Care | Payer: Medicare HMO | Attending: Emergency Medicine | Admitting: Emergency Medicine

## 2016-05-13 ENCOUNTER — Encounter (HOSPITAL_COMMUNITY): Payer: Self-pay | Admitting: Emergency Medicine

## 2016-05-13 DIAGNOSIS — Z79899 Other long term (current) drug therapy: Secondary | ICD-10-CM | POA: Diagnosis not present

## 2016-05-13 DIAGNOSIS — I10 Essential (primary) hypertension: Secondary | ICD-10-CM | POA: Diagnosis not present

## 2016-05-13 DIAGNOSIS — R1011 Right upper quadrant pain: Secondary | ICD-10-CM | POA: Diagnosis present

## 2016-05-13 DIAGNOSIS — M1990 Unspecified osteoarthritis, unspecified site: Secondary | ICD-10-CM | POA: Diagnosis not present

## 2016-05-13 DIAGNOSIS — Z7982 Long term (current) use of aspirin: Secondary | ICD-10-CM | POA: Insufficient documentation

## 2016-05-13 DIAGNOSIS — Z6841 Body Mass Index (BMI) 40.0 and over, adult: Secondary | ICD-10-CM | POA: Diagnosis not present

## 2016-05-13 DIAGNOSIS — F319 Bipolar disorder, unspecified: Secondary | ICD-10-CM | POA: Insufficient documentation

## 2016-05-13 DIAGNOSIS — E669 Obesity, unspecified: Secondary | ICD-10-CM | POA: Diagnosis not present

## 2016-05-13 HISTORY — DX: Essential (primary) hypertension: I10

## 2016-05-13 LAB — URINALYSIS, ROUTINE W REFLEX MICROSCOPIC
Bilirubin Urine: NEGATIVE
Glucose, UA: NEGATIVE mg/dL
Hgb urine dipstick: NEGATIVE
Ketones, ur: NEGATIVE mg/dL
Leukocytes, UA: NEGATIVE
Nitrite: NEGATIVE
Protein, ur: NEGATIVE mg/dL
Specific Gravity, Urine: 1.035 — ABNORMAL HIGH (ref 1.005–1.030)
pH: 6 (ref 5.0–8.0)

## 2016-05-13 LAB — COMPREHENSIVE METABOLIC PANEL
ALT: 11 U/L — ABNORMAL LOW (ref 14–54)
AST: 17 U/L (ref 15–41)
Albumin: 3.5 g/dL (ref 3.5–5.0)
Alkaline Phosphatase: 65 U/L (ref 38–126)
Anion gap: 8 (ref 5–15)
BUN: 14 mg/dL (ref 6–20)
CO2: 26 mmol/L (ref 22–32)
Calcium: 8.6 mg/dL — ABNORMAL LOW (ref 8.9–10.3)
Chloride: 104 mmol/L (ref 101–111)
Creatinine, Ser: 1 mg/dL (ref 0.44–1.00)
GFR calc Af Amer: >60 mL/min (ref >60)
GFR calc non Af Amer: >60 mL/min (ref >60)
Glucose, Bld: 86 mg/dL (ref 65–99)
Potassium: 3.8 mmol/L (ref 3.5–5.1)
Sodium: 138 mmol/L (ref 135–145)
Total Bilirubin: 0.7 mg/dL (ref 0.3–1.2)
Total Protein: 8.3 g/dL — ABNORMAL HIGH (ref 6.5–8.1)

## 2016-05-13 LAB — LIPASE, BLOOD: Lipase: 39 U/L (ref 11–51)

## 2016-05-13 LAB — CBC
HCT: 31.9 % — ABNORMAL LOW (ref 36.0–46.0)
Hemoglobin: 10.2 g/dL — ABNORMAL LOW (ref 12.0–15.0)
MCH: 27.1 pg (ref 26.0–34.0)
MCHC: 32 g/dL (ref 30.0–36.0)
MCV: 84.6 fL (ref 78.0–100.0)
Platelets: 409 10*3/uL — ABNORMAL HIGH (ref 150–400)
RBC: 3.77 MIL/uL — ABNORMAL LOW (ref 3.87–5.11)
RDW: 16.3 % — ABNORMAL HIGH (ref 11.5–15.5)
WBC: 7.6 10*3/uL (ref 4.0–10.5)

## 2016-05-13 LAB — I-STAT BETA HCG BLOOD, ED (MC, WL, AP ONLY): I-stat hCG, quantitative: <5 m[IU]/mL (ref <5)

## 2016-05-13 MED ORDER — OMEPRAZOLE 20 MG PO CPDR: 20.0000 mg | DELAYED_RELEASE_CAPSULE | Freq: Every day | ORAL | 0 refills | Status: DC

## 2016-05-13 MED ORDER — GI COCKTAIL ~~LOC~~
30.0000 mL | Freq: Once | ORAL | Status: AC
  Administered 2016-05-13: 30 mL via ORAL
  Filled 2016-05-13: qty 30

## 2016-05-13 MED ORDER — FAMOTIDINE 20 MG PO TABS: 20.0000 mg | ORAL_TABLET | Freq: Two times a day (BID) | ORAL | 0 refills | Status: DC

## 2016-05-13 MED ORDER — MORPHINE SULFATE (PF) 4 MG/ML IV SOLN
6.0000 mg | Freq: Once | INTRAVENOUS | Status: AC
  Administered 2016-05-13: 6 mg via INTRAVENOUS
  Filled 2016-05-13: qty 2

## 2016-05-13 MED ORDER — SODIUM CHLORIDE 0.9 % IV BOLUS (SEPSIS)
1000.0000 mL | Freq: Once | INTRAVENOUS | Status: AC
  Administered 2016-05-13: 1000 mL via INTRAVENOUS

## 2016-05-13 MED ORDER — PROMETHAZINE HCL 25 MG/ML IJ SOLN
12.5000 mg | Freq: Once | INTRAMUSCULAR | Status: AC
  Administered 2016-05-13: 12.5 mg via INTRAVENOUS
  Filled 2016-05-13: qty 1

## 2016-05-13 MED ORDER — ONDANSETRON HCL 4 MG/2ML IJ SOLN
4.0000 mg | Freq: Once | INTRAMUSCULAR | Status: AC
  Administered 2016-05-13: 4 mg via INTRAVENOUS
  Filled 2016-05-13: qty 2

## 2016-05-13 MED ORDER — ONDANSETRON HCL 4 MG PO TABS: 4.0000 mg | ORAL_TABLET | Freq: Three times a day (TID) | ORAL | 0 refills | Status: DC | PRN

## 2016-05-13 NOTE — ED Triage Notes (Signed)
Patient states that she went to doctor on Monday and was told that believe she has PNA due to the congestion sounds in her lungs so started on Azithromax.  Patient was given orders to get blood work and chest xray done but patient hasnt gotten them done yet.  Patient c/o having epigastric pain that radiates around to her back on right side and pain when she takes a deep breath.

## 2016-05-13 NOTE — ED Notes (Signed)
Lab delay - Pt wants to wait for IV.

## 2016-05-13 NOTE — ED Provider Notes (Signed)
[facility] DEPT Provider Note   CSN: [id number] Arrival date & time: 05/13/16  [id number]  First Provider Contact:  None       History   Chief Complaint Chief Complaint  Patient presents with  . Abdominal Pain  . Back Pain    HPI DAVETTE HOUCHIN is a 45 y.o. female.  HPI   45 year old female with hx of GERD, bipolar,  presenting today with complaining of right upper quadrant abdominal pain. She report intermittent sharp pain to her right upper quadrant that radiates to her back ongoing for the past month and has been increasing in severity and duration. Now her pain is constant. Rates the pain as moderate to severe. Report epigastric burning sensation after eating, feeling nausea and has had nonbloody nonbilious vomitus for the past several days. Does report increasing pain with taking deep breath. She was seen by PCP 3 days ago for this complaint. Further evaluation, her PCP voiced concern for potential pneumonia. She was discharge with Z-Pak and recommendation to obtain outpatient chest x-ray as well as blood work. Patient have not obtain x-ray of blood work yet but decided to come to ER for further evaluation of her symptoms as it has been becoming progressively worse. She has an intact appendix and gallbladder. She denies any prior abdominal surgery. She denies having fever but endorsed chills. She denies having productive cough, shortness of breath, dysuria, hematuria, or rash. She denies alcohol abuse or tobacco abuse but states that she is currently in remission for crack and cocaine use.   Past Medical History:  Diagnosis Date  . Arthritis   . Bipolar 1 disorder ([place])   . 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 GERD (gastroesophageal reflux disease)   . Hypertension   . Migraines   . Sleep apnea    states had test-no cpap ordered-does snore    Patient Active Problem List   Diagnosis Date Noted  . HYPERTENSION 11/12/2010  . OBESITY 11/12/2010  .  COCAINE DEPENDENCE, IN REMISSION 11/12/2010  . DEPRESSION 11/12/2010  . GERD 11/12/2010  . BACK PAIN 11/12/2010    Past Surgical History:  Procedure Laterality Date  . FRACTURE SURGERY     rt femer/knee-plate-screws-  . ORIF ANKLE FRACTURE  03/16/2012   Procedure: OPEN REDUCTION INTERNAL FIXATION (ORIF) ANKLE FRACTURE;  Surgeon: Colin Rhein, MD;  Location: [facility];  Service: Orthopedics;  Laterality: Right;  right lateral malleolus fracture    OB History    No data available       Home Medications    Prior to Admission medications   Medication Sig Start Date End Date Taking? Authorizing Provider  acetaminophen (TYLENOL) 650 MG CR tablet Take 650 mg by mouth every 8 (eight) hours as needed for pain.    Historical Provider, MD  aspirin-sod bicarb-citric acid (ALKA-SELTZER) 325 MG TBEF tablet Take 325 mg by mouth every 6 (six) hours as needed (cold symptoms).    Historical Provider, MD  cephALEXin (KEFLEX) 500 MG capsule Take 1 capsule (500 mg total) by mouth 3 (three) times daily. Patient not taking: Reported on 01/10/2016 11/26/15   Jola Schmidt, MD  cyclobenzaprine (FLEXERIL) 10 MG tablet Take 1 tablet (10 mg total) by mouth 2 (two) times daily as needed for muscle spasms. 11/04/13   Ignacia Felling, PA-C  famotidine (PEPCID) 20 MG tablet Take 1 tablet (20 mg total) by mouth 2 (two) times daily. 01/10/16   Tatyana Kirichenko, PA-C  guaiFENesin-dextromethorphan (  ROBITUSSIN DM) 100-10 MG/5ML syrup Take 5 mLs by mouth every 4 (four) hours as needed for cough. 01/10/16   Tatyana Kirichenko, PA-C  HYDROcodone-acetaminophen (NORCO/VICODIN) 5-325 MG tablet Take 1-2 tablets by mouth every 4 (four) hours as needed for severe pain. Patient not taking: Reported on 01/10/2016 11/21/15   Sharlett Iles, MD  ibuprofen (ADVIL,MOTRIN) 600 MG tablet Take 1 tablet (600 mg total) by mouth every 6 (six) hours as needed. 01/10/16   Tatyana Kirichenko, PA-C  meclizine (ANTIVERT) 50 MG  tablet Take 0.5 tablets (25 mg total) by mouth 3 (three) times daily as needed. Patient not taking: Reported on 01/10/2016 07/30/15   Roberto Scales, MD  ondansetron [facility] ODT) 8 MG disintegrating tablet Take 1 tablet (8 mg total) by mouth every 8 (eight) hours as needed for nausea or vomiting. 01/10/16   Jeannett Senior, PA-C  phenazopyridine (PYRIDIUM) 200 MG tablet Take 1 tablet (200 mg total) by mouth 3 (three) times daily with meals. Patient not taking: Reported on 01/10/2016 11/26/15   Jola Schmidt, MD  pseudoephedrine (SUDAFED) 30 MG tablet Take 1 tablet (30 mg total) by mouth every 4 (four) hours as needed for congestion. 01/10/16   Tatyana Kirichenko, PA-C  Pseudoephedrine-Ibuprofen 30-200 MG TABS Take 1 tablet by mouth every 6 (six) hours as needed (cold symptoms).    Historical Provider, MD  ranitidine (ZANTAC) 150 MG tablet Take 150 mg by mouth 2 (two) times daily.    Historical Provider, MD  SUMAtriptan (IMITREX) 50 MG tablet Take 50 mg by mouth daily as needed. migraine 07/18/15   Historical Provider, MD    Family History No family history on file.  Social History Social History  Substance Use Topics  . Smoking status: Never Smoker  . Smokeless tobacco: Never Used  . Alcohol use No     Comment: occ     Allergies   Review of patient's allergies indicates no known allergies.   Review of Systems Review of Systems  All other systems reviewed and are negative.    Physical Exam Updated Vital Signs BP 121/78 (BP Location: Right Wrist)   Pulse 75   Temp 98.6 F (37 C) (Oral)   SpO2 100%   Physical Exam  Constitutional: She appears well-developed and well-nourished. No distress.  Moderately obese African American female sitting in bed in no significant discomfort and nontoxic in appearance  HENT:  Head: Atraumatic.  Eyes: Conjunctivae are normal.  Neck: Neck supple.  Cardiovascular: Normal rate and regular rhythm.   Pulmonary/Chest: Effort normal and breath sounds  normal. No respiratory distress. She has no wheezes. She has no rales. She exhibits no tenderness.  Abdominal: Soft. There is tenderness (Right upper quadrant tenderness on palpation with positive Murphy's sign no pain at McBurney's point. Examination is difficult due to large body habitus.).  Neurological: She is alert.  Skin: No rash noted.  Psychiatric: She has a normal mood and affect.  Nursing note and vitals reviewed.    ED Treatments / Results  Labs (all labs ordered are listed, but only abnormal results are displayed) Labs Reviewed  COMPREHENSIVE METABOLIC PANEL - Abnormal; Notable for the following:       Result Value   Calcium 8.6 (*)    Total Protein 8.3 (*)    ALT 11 (*)    All other components within normal limits  CBC - Abnormal; Notable for the following:    RBC 3.77 (*)    Hemoglobin 10.2 (*)    HCT 31.9 (*)  RDW 16.3 (*)    Platelets 409 (*)    All other components within normal limits  URINALYSIS, ROUTINE W REFLEX MICROSCOPIC (NOT AT [facility]) - Abnormal; Notable for the following:    APPearance CLOUDY (*)    Specific Gravity, Urine 1.035 (*)    All other components within normal limits  LIPASE, BLOOD  I-STAT BETA HCG BLOOD, ED ([facility], [facility], [facility] ONLY)    EKG  EKG Interpretation None       Radiology Dg Chest 2 View  Result Date: 05/13/2016 CLINICAL DATA:  Epigastric pain radiating to the back on the right for 4-5 days. Pain with deep breathing. Question pneumonia. EXAM: CHEST  2 VIEW COMPARISON:  PA and lateral chest 01/10/2016. FINDINGS: The lungs are clear. Heart size is mildly enlarged. There is no pneumothorax or pleural effusion. No bony abnormality. IMPRESSION: No acute disease. Mild cardiomegaly. Electronically Signed   By: Inge Rise M.D.   On: 05/13/2016 13:17  US Abdomen Limited Ruq  Result Date: 05/13/2016 CLINICAL DATA:  Acute right upper quadrant abdominal pain. EXAM: US ABDOMEN LIMITED - RIGHT UPPER QUADRANT COMPARISON:  None. FINDINGS:  Gallbladder: No gallstones or wall thickening visualized. No sonographic Murphy sign noted by sonographer. Common bile duct: Diameter: 2.6 mm which is within normal limits. Liver: No focal lesion identified. Within normal limits in parenchymal echogenicity. IMPRESSION: No abnormality seen in the right upper quadrant of the abdomen. Electronically Signed   By: Marijo Conception, M.D.   On: 05/13/2016 12:27   Procedures Procedures (including critical care time)  Medications Ordered in ED Medications - No data to display   Initial Impression / Assessment and Plan / ED Course  I have reviewed the triage vital signs and the nursing notes.  Pertinent labs & imaging results that were available during my care of the patient were reviewed by me and considered in my medical decision making (see chart for details).  Clinical Course    BP 123/74   Pulse 64   Temp 98.6 F (37 C) (Oral)   Resp 18   Ht 5\' 4"  (1.626 m)   Wt (!) 155.6 kg   LMP 04/10/2016   SpO2 99%   BMI 58.88 kg/m    Final Clinical Impressions(s) / ED Diagnoses   Final diagnoses:  RUQ abdominal pain    New Prescriptions New Prescriptions   OMEPRAZOLE (PRILOSEC) 20 MG CAPSULE    Take 1 capsule (20 mg total) by mouth daily.   ONDANSETRON (ZOFRAN) 4 MG TABLET    Take 1 tablet (4 mg total) by mouth every 8 (eight) hours as needed for nausea or vomiting.    12:00 PM Patient here with recurrent right upper quadrant abdominal pain for nearly a month. The pain is concerning for biliary disease. Workup initiated, abdominal ultrasound ordered and chest x-ray ordered.  2:55 PM Pregnancy test is negative, no leukocytosis, electrolyte panels are reassuring, urine shows no signs of urinary tract infection, her chest x-ray shows no signs of pneumonia, and a right upper quadrant abdominal ultrasound is without evidence of biliary disease.  Pt did report improvement of sxs after receiving GI cocktail.  At this time I recommend pt to  f/u with PCP for further evaluation of her condition.  Will give PPI and H2 blocker prescription.  Strict return precaution discussed.  Suspect GERD/PUD.  Doubt acute emergent abdominal pathology.    Domenic Moras, PA-C 05/13/16 [place], MD 05/14/16 1134

## 2016-05-13 NOTE — Discharge Instructions (Signed)
You have been evaluated for your abdominal pain.  No evidence of pneumonia or gallbladder disease on today's exam.  Please take prilosec and pepcid as prescribed for heart burn. Take zofran for nausea and follow up with GI specialist for further evaluation of your condition.  Return to the ER if your condition worsen or if you have other concerns.

## 2016-05-13 NOTE — ED Notes (Signed)
Patient received sandwich and ginger ale.

## 2016-05-13 NOTE — ED Notes (Signed)
Pt given discharge instructions, verbalized understanding of need to follow up, reasons to return to the ED and medications to take at home. PT denied further questions or concerns. Pt escorted via wheelchair to exit without difficulty.

## 2016-05-13 NOTE — ED Notes (Signed)
When calling pt from lobby pt was eating. I asked pt not to eating anything else until the MD screens her.

## 2016-10-09 ENCOUNTER — Emergency Department
Admission: EM | Admit: 2016-10-09 | Discharge: 2016-10-09 | Disposition: A | Discharge status: Home / Self Care | Payer: Medicare Other | Attending: Emergency Medicine | Admitting: Emergency Medicine

## 2016-10-09 DIAGNOSIS — K0381 Cracked tooth: Secondary | ICD-10-CM | POA: Diagnosis not present

## 2016-10-09 DIAGNOSIS — I10 Essential (primary) hypertension: Secondary | ICD-10-CM | POA: Diagnosis not present

## 2016-10-09 DIAGNOSIS — K0889 Other specified disorders of teeth and supporting structures: Secondary | ICD-10-CM

## 2016-10-09 DIAGNOSIS — F1490 Cocaine use, unspecified, uncomplicated: Secondary | ICD-10-CM | POA: Diagnosis not present

## 2016-10-09 DIAGNOSIS — Z79899 Other long term (current) drug therapy: Secondary | ICD-10-CM | POA: Diagnosis not present

## 2016-10-09 DIAGNOSIS — S025XXA Fracture of tooth (traumatic), initial encounter for closed fracture: Secondary | ICD-10-CM

## 2016-10-09 MED ORDER — TRAMADOL HCL 50 MG PO TABS: 50.0000 mg | ORAL_TABLET | Freq: Four times a day (QID) | ORAL | 0 refills | Status: AC | PRN

## 2016-10-09 MED ORDER — LIDOCAINE VISCOUS 2 % MT SOLN
15.0000 mL | Freq: Once | OROMUCOSAL | Status: AC
  Administered 2016-10-09: 15 mL via OROMUCOSAL
  Filled 2016-10-09: qty 15

## 2016-10-09 MED ORDER — AMOXICILLIN 500 MG PO CAPS: 500.0000 mg | ORAL_CAPSULE | Freq: Three times a day (TID) | ORAL | 0 refills | Status: DC

## 2016-10-09 MED ORDER — OXYCODONE-ACETAMINOPHEN 5-325 MG PO TABS
2.0000 | ORAL_TABLET | Freq: Once | ORAL | Status: AC
  Administered 2016-10-09: 2 via ORAL
  Filled 2016-10-09: qty 2

## 2016-10-09 MED ORDER — LIDOCAINE VISCOUS 2 % MT SOLN: 5.0000 mL | Freq: Four times a day (QID) | OROMUCOSAL | 0 refills | Status: DC | PRN

## 2016-10-09 NOTE — ED Triage Notes (Signed)
Left sided dental pain x 2 days. Pt has called dentist, unable to get appt. Pt alert and oriented X4, active, cooperative, pt in NAD. RR even and unlabored, color WNL.

## 2016-10-09 NOTE — ED Notes (Signed)
Pt discharged home after verbalizing understanding of discharge instructions; nad noted. 

## 2016-10-09 NOTE — Discharge Instructions (Signed)
May follow-up from the list of dental clinics provided. OPTIONS FOR DENTAL FOLLOW UP CARE  [place] [facility] and [place] [email or web address] [phone])  [facility] [phone])  [place] [phone] ext [phone])  [place] [phone])  [facility] [phone]) This clinic caters to the indigent population and is on a lottery system. Location: [employer] [facility], [place], [place], [place] Clinic Hours: Wednesdays from 6pm - 9pm, patients seen by a lottery system. For dates, call or go to [email or web address] Services: Cleanings, fillings and simple extractions. Payment Options: DENTAL WORK IS FREE OF CHARGE. Bring proof of income or support. Best way to get seen: Arrive at 5:15 pm - this is a lottery, NOT first come/first serve, so arriving earlier will not increase your chances of being seen.     [facility] [phone] Select option 1 for emergencies   Location: [facility], [place], [address] [place] [facility] Hours: No walk-ins accepted - call the day before to schedule an appointment. Check in times are 9:30 am and 1:30 pm. Services: Simple extractions, temporary fillings, pulpectomy/pulp debridement, uncomplicated abscess drainage. Payment Options: PAYMENT IS DUE AT THE TIME OF SERVICE.  Fee is usually $100-200, additional surgical procedures (e.g. abscess drainage) may be extra. Cash, checks, Visa/MasterCard accepted.  Can file Medicaid if patient is covered for dental - patient should call case worker to check. No discount for [facility] patients. Best way to get seen: MUST call the day before and get onto the schedule. Can usually be seen the next 1-2 days. No walk-ins accepted.      [place] [phone]   Location: [place], [place] Clinic Hours: M, W, Th, F 8am or 1:30pm, Tues 9a or 1:30 - first come/first served. Services: Simple extractions, temporary fillings, uncomplicated abscess drainage.  You do not need to be an [facility] resident. Payment Options: PAYMENT IS DUE AT THE TIME OF SERVICE. Dental insurance, otherwise sliding scale - bring proof of income or support. Depending on income and treatment needed, cost is usually $50-200. Best way to get seen: Arrive early as it is first come/first served.     [facility] [phone]   Location: [place] [facility] Hours: Mon-Thu 8a-5p Services: Most basic dental services including extractions and fillings. Payment Options: PAYMENT IS DUE AT THE TIME OF SERVICE. Sliding scale, up to 50% off - bring proof if income or support. Medicaid with dental option accepted. Best way to get seen: Call to schedule an appointment, can usually be seen within 2 weeks OR they will try to see walk-ins - show up at [place] or 2p (you may have to wait).     [facility] [place] RESIDENTS ONLY   Location: [facility], [place] [address] [place], [place] [address] Clinic Hours: By appointment only. Monday - Thursday 8am-5pm, Friday 8am-12pm Services: Cleanings, fillings, extractions. Payment Options: PAYMENT IS DUE AT THE TIME OF SERVICE. Cash, Visa or MasterCard. Sliding scale - $30 minimum per service. Best way to get seen: Come in to office, complete packet and make an appointment - need proof of income or support monies for each household member and proof of [facility] residence. Usually takes about a month to get in.     [facility]  Dental Clinic [phone]   Location: [address], [place] [facility] Hours: Walk-in Urgent Care  Dental Services are offered Monday-Friday mornings only. The numbers of emergencies accepted daily is limited to the number of providers available. Maximum 15 - Mondays, Wednesdays & Thursdays Maximum 10 - Tuesdays & Fridays Services: You do not need to be a [facility] resident to be seen for a dental emergency. Emergencies are defined as pain, swelling, abnormal bleeding, or dental trauma. Walkins will receive x-rays if needed. NOTE: Dental cleaning is not an emergency. Payment Options: PAYMENT IS DUE AT THE TIME OF SERVICE. Minimum co-pay is $40.00 for uninsured patients. Minimum co-pay is $3.00 for Medicaid with dental coverage. Dental Insurance is accepted and must be presented at time of visit. Medicare does not cover dental. Forms of payment: Cash, credit card, checks. Best way to get seen: If not previously registered with the clinic, walk-in dental registration begins at 7:15 am and is on a first come/first serve basis. If previously registered with the clinic, call to make an appointment.     [facility] [place] ONLY   Location: [address], [place], [place] Clinic Hours: Mon-Thu 10a-2p Services: Extractions only! Payment Options: FREE (donations accepted) - bring proof of income or support Best way to get seen: Call and schedule an appointment OR come at 8am on the 1st Monday of every month (except for holidays) when it is first come/first served.     [facility] [phone]   Location: [place], [place] Clinic Hours: Friday mornings Services, Payment Options, Best way to get seen: Call for info

## 2016-10-09 NOTE — ED Notes (Signed)
Pt presents with dental pain to right side x 1-2 weeks. Pt states she has been trying to get appt with dentist but can't get an appointment until after the holiday. Pt was told to call back on Tuesday for an appointment. Pt states she is taking muscle relaxers and ibuprofen with no relief. NAD noted.

## 2016-10-09 NOTE — ED Provider Notes (Signed)
[facility] Emergency Department Provider Note   ____________________________________________   First MD Initiated Contact with Patient 10/09/16 1237     (approximate)  I have reviewed the triage vital signs and the nursing notes.   HISTORY  Chief Complaint Dental Pain    HPI Danielle [name] is a 45 y.o. female patient complaining of dental pain for 2 days. Patient states she called her dentist and was told no appointment after first of the year. Patient state she cannot use out of network dentist secondary to insurance issues.Patient rating the pain as a 10 over 10. Patient stated no relief taking ibuprofen.   Past Medical History:  Diagnosis Date  . Arthritis   . Bipolar 1 disorder ([place])   . 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 GERD (gastroesophageal reflux disease)   . Hypertension   . Migraines   . Sleep apnea    states had test-no cpap ordered-does snore    Patient Active Problem List   Diagnosis Date Noted  . HYPERTENSION 11/12/2010  . OBESITY 11/12/2010  . COCAINE DEPENDENCE, IN REMISSION 11/12/2010  . DEPRESSION 11/12/2010  . GERD 11/12/2010  . BACK PAIN 11/12/2010    Past Surgical History:  Procedure Laterality Date  . FRACTURE SURGERY     rt femer/knee-plate-screws-  . ORIF ANKLE FRACTURE  03/16/2012   Procedure: OPEN REDUCTION INTERNAL FIXATION (ORIF) ANKLE FRACTURE;  Surgeon: Colin Rhein, MD;  Location: [place];  Service: Orthopedics;  Laterality: Right;  right lateral malleolus fracture    Prior to Admission medications   Medication Sig Start Date End Date Taking? Authorizing Provider  acetaminophen (TYLENOL) 650 MG CR tablet Take 650 mg by mouth every 8 (eight) hours as needed for pain.    Historical Provider, MD  amoxicillin (AMOXIL) 500 MG capsule Take 1 capsule (500 mg total) by mouth 3 (three) times daily. 10/09/16   Sable Feil, PA-C  azithromycin (ZITHROMAX) 250 MG  tablet Take 250 mg by mouth daily.     Historical Provider, MD  cephALEXin (KEFLEX) 500 MG capsule Take 1 capsule (500 mg total) by mouth 3 (three) times daily. Patient not taking: Reported on 01/10/2016 11/26/15   Jola Schmidt, MD  famotidine (PEPCID) 20 MG tablet Take 1 tablet (20 mg total) by mouth 2 (two) times daily. 05/13/16   Domenic Moras, PA-C  guaiFENesin-dextromethorphan (ROBITUSSIN DM) 100-10 MG/5ML syrup Take 5 mLs by mouth every 4 (four) hours as needed for cough. Patient not taking: Reported on 05/13/2016 01/10/16   Jeannett Senior, PA-C  hydrochlorothiazide (HYDRODIURIL) 25 MG tablet Take 25 mg by mouth daily.    Historical Provider, MD  HYDROcodone-acetaminophen (NORCO/VICODIN) 5-325 MG tablet Take 1-2 tablets by mouth every 4 (four) hours as needed for severe pain. Patient not taking: Reported on 01/10/2016 11/21/15   Sharlett Iles, MD  ibuprofen (ADVIL,MOTRIN) 600 MG tablet Take 1 tablet (600 mg total) by mouth every 6 (six) hours as needed. Patient not taking: Reported on 05/13/2016 01/10/16   Tatyana Kirichenko, PA-C  lidocaine (XYLOCAINE) 2 % solution Use as directed 5 mLs in the mouth or throat every 6 (six) hours as needed for mouth pain. Oral swish 10/09/16   Sable Feil, PA-C  methocarbamol (ROBAXIN) 750 MG tablet Take 750 mg by mouth daily as needed for muscle spasms.     Historical Provider, MD  omeprazole (PRILOSEC) 20 MG capsule Take 1 capsule (20 mg total) by mouth daily.  05/13/16   Domenic Moras, PA-C  ondansetron (ZOFRAN ODT) 8 MG disintegrating tablet Take 1 tablet (8 mg total) by mouth every 8 (eight) hours as needed for nausea or vomiting. Patient not taking: Reported on 05/13/2016 01/10/16   Lahoma Rocker Kirichenko, PA-C  ondansetron (ZOFRAN) 4 MG tablet Take 1 tablet (4 mg total) by mouth every 8 (eight) hours as needed for nausea or vomiting. 05/13/16   Domenic Moras, PA-C  phenazopyridine (PYRIDIUM) 200 MG tablet Take 1 tablet (200 mg total) by mouth 3 (three) times daily  with meals. Patient not taking: Reported on 01/10/2016 11/26/15   Jola Schmidt, MD  pseudoephedrine (SUDAFED) 30 MG tablet Take 1 tablet (30 mg total) by mouth every 4 (four) hours as needed for congestion. Patient not taking: Reported on 05/13/2016 01/10/16   Jeannett Senior, PA-C  ranitidine (ZANTAC) 150 MG tablet Take 150 mg by mouth 2 (two) times daily.    Historical Provider, MD  traMADol (ULTRAM) 50 MG tablet Take 1 tablet (50 mg total) by mouth every 6 (six) hours as needed. 10/09/16 10/09/17  Sable Feil, PA-C    Allergies Patient has no known allergies.  No family history on file.  Social History Social History  Substance Use Topics  . Smoking status: Never Smoker  . Smokeless tobacco: Never Used  . Alcohol use No     Comment: occ    Review of Systems Constitutional: No fever/chills Eyes: No visual changes. ENT: Dental pain  Cardiovascular: Denies chest pain. Respiratory: Denies shortness of breath. Gastrointestinal: No abdominal pain.  No nausea, no vomiting.  No diarrhea.  No constipation. Genitourinary: Negative for dysuria. Musculoskeletal: Negative for back pain. Skin: Negative for rash. Neurological: Negative for headaches, focal weakness or numbness. Psychiatric:Bipolar Endocrine:Hypertension   ____________________________________________   PHYSICAL EXAM:  VITAL SIGNS: ED Triage Vitals  Enc Vitals Group     BP 10/09/16 1158 129/67     Pulse Rate 10/09/16 1158 78     Resp 10/09/16 1158 16     Temp 10/09/16 1158 97.8 F (36.6 C)     Temp Source 10/09/16 1158 Oral     SpO2 10/09/16 1158 100 %     Weight 10/09/16 1157 (!) 340 lb (154.2 kg)     Height --      Head Circumference --      Peak Flow --      Pain Score 10/09/16 1157 10     Pain Loc --      Pain Edu? --      Excl. in [place]? --     Constitutional: Alert and oriented. Well appearing and in no acute distress. Eyes: Conjunctivae are normal. PERRL. EOMI. Head: Atraumatic. Nose: No  congestion/rhinnorhea. Mouth/Throat: Mucous membranes are moist.  Oropharynx non-erythematous. Fractured tooth #2 mild gingival edema. Neck: No stridor.  No cervical spine tenderness to palpation. Hematological/Lymphatic/Immunilogical: No cervical lymphadenopathy. Cardiovascular: Normal rate, regular rhythm. Grossly normal heart sounds.  Good peripheral circulation. Respiratory: Normal respiratory effort.  No retractions. Lungs CTAB. Gastrointestinal: Soft and nontender. No distention. No abdominal bruits. No CVA tenderness. Musculoskeletal: No lower extremity tenderness nor edema.  No joint effusions. Neurologic:  Normal speech and language. No gross focal neurologic deficits are appreciated. No gait instability. Skin:  Skin is warm, dry and intact. No rash noted. Psychiatric: Mood and affect are normal. Speech and behavior are normal.  ____________________________________________   LABS (all labs ordered are listed, but only abnormal results are displayed)  Labs Reviewed - No data to  display ____________________________________________  EKG   ____________________________________________  RADIOLOGY   ____________________________________________   PROCEDURES  Procedure(s) performed: None  Procedures  Critical Care performed: No  ____________________________________________   INITIAL IMPRESSION / ASSESSMENT AND PLAN / ED COURSE  Pertinent labs & imaging results that were available during my care of the patient were reviewed by me and considered in my medical decision making (see chart for details).  Dental pain secondary to fractured tooth. Patient given discharge care instructions. Patient given a prescription for Amoxil, prescription lidocaine and tramadol. Patient advised follow-up with her treating dentist or from the list of dental clinics provided  Clinical Course      ____________________________________________   FINAL CLINICAL IMPRESSION(S) / ED  DIAGNOSES  Final diagnoses:  Pain, dental  Closed fracture of tooth, initial encounter      NEW MEDICATIONS STARTED DURING THIS VISIT:  New Prescriptions   AMOXICILLIN (AMOXIL) 500 MG CAPSULE    Take 1 capsule (500 mg total) by mouth 3 (three) times daily.   LIDOCAINE (XYLOCAINE) 2 % SOLUTION    Use as directed 5 mLs in the mouth or throat every 6 (six) hours as needed for mouth pain. Oral swish   TRAMADOL (ULTRAM) 50 MG TABLET    Take 1 tablet (50 mg total) by mouth every 6 (six) hours as needed.     Note:  This document was prepared using Dragon voice recognition software and may include unintentional dictation errors.    Sable Feil, PA-C 10/09/16 1246    Lisa Roca, MD 10/09/16 [phone]

## 2017-05-17 ENCOUNTER — Encounter (HOSPITAL_COMMUNITY): Payer: Self-pay | Admitting: Emergency Medicine

## 2017-05-17 ENCOUNTER — Emergency Department (HOSPITAL_COMMUNITY)
Admission: EM | Admit: 2017-05-17 | Discharge: 2017-05-17 | Disposition: A | Discharge status: Home / Self Care | Payer: Medicare Other | Attending: Emergency Medicine | Admitting: Emergency Medicine

## 2017-05-17 DIAGNOSIS — Z79899 Other long term (current) drug therapy: Secondary | ICD-10-CM | POA: Insufficient documentation

## 2017-05-17 DIAGNOSIS — I10 Essential (primary) hypertension: Secondary | ICD-10-CM | POA: Insufficient documentation

## 2017-05-17 DIAGNOSIS — K0889 Other specified disorders of teeth and supporting structures: Secondary | ICD-10-CM | POA: Diagnosis present

## 2017-05-17 MED ORDER — ACETAMINOPHEN 325 MG PO TABS
650.0000 mg | ORAL_TABLET | Freq: Once | ORAL | Status: AC
  Administered 2017-05-17: 650 mg via ORAL
  Filled 2017-05-17: qty 2

## 2017-05-17 MED ORDER — PENICILLIN V POTASSIUM 500 MG PO TABS: 500.0000 mg | ORAL_TABLET | Freq: Four times a day (QID) | ORAL | 0 refills | Status: AC

## 2017-05-17 MED ORDER — PENICILLIN V POTASSIUM 500 MG PO TABS
500.0000 mg | ORAL_TABLET | Freq: Once | ORAL | Status: AC
  Administered 2017-05-17: 500 mg via ORAL
  Filled 2017-05-17: qty 1

## 2017-05-17 NOTE — ED Triage Notes (Signed)
Patient c/o dental pain and abscess on right upper side that has been bothering her for over month. patient has seen dentist who gave Ibuprofen 800mg  and antibiotics without any relief.  Patient cant get tooth pulled until abscess is resolved. Patient radiating to right ear and lower jaw area.

## 2017-05-17 NOTE — ED Provider Notes (Signed)
[facility] DEPT Provider Note   CSN: [id number] Arrival date & time: 05/17/17  1446   By signing my name below, I, [name], attest that this documentation has been prepared under the direction and in the presence of Lenn Sink, PA-C Electronically Signed: [place], ED Scribe. 05/17/17. 4:36 PM.  History   Chief Complaint Chief Complaint  Patient presents with  . Dental Pain    HPI Danielle [name] is a 46 y.o. female with a PMHx of HTN,, who presents to the Emergency Department complaining of right upper dental pain onset 2-3 months ago. Pt reports associated throbbing right sided HA. Pt has tried 2- 800 mg Ibuprofen at a time and Rx amoxil with initial relief of her symptoms. She states that following the abx treatment her dental pain initially resolved but then returned shortly after completing the treatment. Pt notes that she has a dentist in [place] who initially evaluated her teeth with imaging and informed that following treatment, they would extract the pt teeth. Pt reports that she was informed to call and follow up with the dentist following abx treatment, to which the pt didn't.  She denies fever, chills, drainage, and any other symptoms. Pt denies allergies to any medications.    The history is provided by the patient. No language interpreter was used.    Past Medical History:  Diagnosis Date  . Arthritis   . Bipolar 1 disorder ([place])   . 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 GERD (gastroesophageal reflux disease)   . Hypertension   . Migraines   . Sleep apnea    states had test-no cpap ordered-does snore    Patient Active Problem List   Diagnosis Date Noted  . HYPERTENSION 11/12/2010  . OBESITY 11/12/2010  . COCAINE DEPENDENCE, IN REMISSION 11/12/2010  . DEPRESSION 11/12/2010  . GERD 11/12/2010  . BACK PAIN 11/12/2010    Past Surgical History:  Procedure Laterality Date  . FRACTURE SURGERY     rt  femer/knee-plate-screws-  . ORIF ANKLE FRACTURE  03/16/2012   Procedure: OPEN REDUCTION INTERNAL FIXATION (ORIF) ANKLE FRACTURE;  Surgeon: Colin Rhein, MD;  Location: [place];  Service: Orthopedics;  Laterality: Right;  right lateral malleolus fracture    OB History    No data available       Home Medications    Prior to Admission medications   Medication Sig Start Date End Date Taking? Authorizing Provider  acetaminophen (TYLENOL) 650 MG CR tablet Take 650 mg by mouth every 8 (eight) hours as needed for pain.    [provider]  amoxicillin (AMOXIL) 500 MG capsule Take 1 capsule (500 mg total) by mouth 3 (three) times daily. 10/09/16   Sable Feil, PA-C  azithromycin (ZITHROMAX) 250 MG tablet Take 250 mg by mouth daily.     [provider]  cephALEXin (KEFLEX) 500 MG capsule Take 1 capsule (500 mg total) by mouth 3 (three) times daily. Patient not taking: Reported on 01/10/2016 11/26/15   Jola Schmidt, MD  famotidine (PEPCID) 20 MG tablet Take 1 tablet (20 mg total) by mouth 2 (two) times daily. 05/13/16   Domenic Moras, PA-C  guaiFENesin-dextromethorphan (ROBITUSSIN DM) 100-10 MG/5ML syrup Take 5 mLs by mouth every 4 (four) hours as needed for cough. Patient not taking: Reported on 05/13/2016 01/10/16   Jeannett Senior, PA-C  hydrochlorothiazide (HYDRODIURIL) 25 MG tablet Take 25 mg by mouth daily.    [provider]  HYDROcodone-acetaminophen (NORCO/VICODIN) 5-325 MG tablet Take 1-2 tablets by mouth every 4 (four) hours as needed for severe pain. Patient not taking: Reported on 01/10/2016 11/21/15   Little, Wenda Overland, MD  ibuprofen (ADVIL,MOTRIN) 600 MG tablet Take 1 tablet (600 mg total) by mouth every 6 (six) hours as needed. Patient not taking: Reported on 05/13/2016 01/10/16   Jeannett Senior, PA-C  lidocaine (XYLOCAINE) 2 % solution Use as directed 5 mLs in the mouth or throat every 6 (six) hours as needed for mouth pain. Oral  swish 10/09/16   Sable Feil, PA-C  methocarbamol (ROBAXIN) 750 MG tablet Take 750 mg by mouth daily as needed for muscle spasms.     [provider]  omeprazole (PRILOSEC) 20 MG capsule Take 1 capsule (20 mg total) by mouth daily. 05/13/16   Domenic Moras, PA-C  ondansetron (ZOFRAN ODT) 8 MG disintegrating tablet Take 1 tablet (8 mg total) by mouth every 8 (eight) hours as needed for nausea or vomiting. Patient not taking: Reported on 05/13/2016 01/10/16   Jeannett Senior, PA-C  ondansetron (ZOFRAN) 4 MG tablet Take 1 tablet (4 mg total) by mouth every 8 (eight) hours as needed for nausea or vomiting. 05/13/16   Domenic Moras, PA-C  penicillin v potassium (VEETID) 500 MG tablet Take 1 tablet (500 mg total) by mouth 4 (four) times daily. 05/17/17 05/24/17  Junell Cullifer, Dellis Filbert, PA-C  phenazopyridine (PYRIDIUM) 200 MG tablet Take 1 tablet (200 mg total) by mouth 3 (three) times daily with meals. Patient not taking: Reported on 01/10/2016 11/26/15   Jola Schmidt, MD  pseudoephedrine (SUDAFED) 30 MG tablet Take 1 tablet (30 mg total) by mouth every 4 (four) hours as needed for congestion. Patient not taking: Reported on 05/13/2016 01/10/16   Jeannett Senior, PA-C  ranitidine (ZANTAC) 150 MG tablet Take 150 mg by mouth 2 (two) times daily.    [provider]  traMADol (ULTRAM) 50 MG tablet Take 1 tablet (50 mg total) by mouth every 6 (six) hours as needed. 10/09/16 10/09/17  Sable Feil, PA-C    Family History No family history on file.  Social History Social History  Substance Use Topics  . Smoking status: Never Smoker  . Smokeless tobacco: Never Used  . Alcohol use No     Comment: occ     Allergies   Patient has no known allergies.   Review of Systems Review of Systems  Constitutional: Negative for chills and fever.  HENT: Positive for dental problem (right upper).        No drainage to the affected area  Neurological: Positive for headaches (right sided).  All other  systems reviewed and are negative.    Physical Exam Updated Vital Signs BP (!) 144/78 (BP Location: Left Arm)   Pulse 74   Temp 98.7 F (37.1 C) (Oral)   Resp 18   LMP 03/19/2017   SpO2 98%   Physical Exam  Constitutional: She is oriented to person, place, and time. She appears well-developed and well-nourished. No distress.  HENT:  Head: Normocephalic and atraumatic.  Mouth/Throat: Uvula is midline, oropharynx is clear and moist and mucous membranes are normal. No dental abscesses.  No obvious swelling or abnormality. Gumline palpated with tenderness over the right maxillary lateral gum line with no obvious swelling or fluctuance. No other signs of acute infection.   Eyes: EOM are normal.  Neck: Neck supple.  Cardiovascular: Normal rate.   Pulmonary/Chest: Effort normal. No respiratory distress.  Abdominal: She exhibits no distension.  Musculoskeletal: Normal range of motion.  Neurological: She is alert and oriented to person, place, and time.  Skin: Skin is warm and dry.  Psychiatric: She has a normal mood and affect. Her behavior is normal.  Nursing note and vitals reviewed.    ED Treatments / Results  DIAGNOSTIC STUDIES: Oxygen Saturation is 98% on RA, nl by my interpretation.    COORDINATION OF CARE: 4:35 PM Discussed treatment plan with pt at bedside and pt agreed to plan.   Labs (all labs ordered are listed, but only abnormal results are displayed) Labs Reviewed - No data to display  EKG  EKG Interpretation None       Radiology No results found.  Procedures Procedures (including critical care time)  Medications Ordered in ED Medications  penicillin v potassium (VEETID) tablet 500 mg (500 mg Oral Given 05/17/17 1642)  acetaminophen (TYLENOL) tablet 650 mg (650 mg Oral Given 05/17/17 1642)     Initial Impression / Assessment and Plan / ED Course  I have reviewed the triage vital signs and the nursing notes.  Pertinent labs & imaging results that  were available during my care of the patient were reviewed by me and considered in my medical decision making (see chart for details).      Labs:   Imaging:  Consults:  Therapeutics:  Discharge Meds:   Assessment/Plan: Patient presents with right upper dentalgia. No abscess requiring immediate incision and drainage. Exam not concerning for Ludwig's angina or pharyngeal abscess. Will treat with penicillin Rx. Pt instructed to follow-up with her dentist. Discussed return precautions. Pt safe for discharge.  Final Clinical Impressions(s) / ED Diagnoses   Final diagnoses:  Pain, dental    New Prescriptions Discharge Medication List as of 05/17/2017  5:20 PM    START taking these medications   Details  penicillin v potassium (VEETID) 500 MG tablet Take 1 tablet (500 mg total) by mouth 4 (four) times daily., Starting Mon 05/17/2017, Until Mon 05/24/2017, Print       I personally performed the services described in this documentation, which was scribed in my presence. The recorded information has been reviewed and is accurate.    Okey Regal, PA-C 05/17/17 1743    Quintella Reichert, MD 05/18/17 [phone]

## 2017-05-17 NOTE — Discharge Instructions (Signed)
Please read attached information. If you experience any new or worsening signs or symptoms please return to the emergency room for evaluation. Please follow-up with your primary care provider or specialist as discussed. Please use medication prescribed only as directed and discontinue taking if you have any concerning signs or symptoms.   °

## 2017-08-02 ENCOUNTER — Ambulatory Visit: Payer: Self-pay | Admitting: Family Medicine

## 2018-02-26 ENCOUNTER — Encounter: Payer: Self-pay | Admitting: Emergency Medicine

## 2018-02-26 ENCOUNTER — Emergency Department (HOSPITAL_COMMUNITY)
Admission: EM | Admit: 2018-02-26 | Discharge: 2018-02-26 | Disposition: A | Discharge status: Home / Self Care | Payer: Medicare Other

## 2018-02-26 ENCOUNTER — Emergency Department
Admission: EM | Admit: 2018-02-26 | Discharge: 2018-02-26 | Disposition: A | Discharge status: Home / Self Care | Payer: Medicare Other | Attending: Emergency Medicine | Admitting: Emergency Medicine

## 2018-02-26 ENCOUNTER — Emergency Department: Payer: Medicare Other

## 2018-02-26 DIAGNOSIS — M79605 Pain in left leg: Secondary | ICD-10-CM | POA: Insufficient documentation

## 2018-02-26 DIAGNOSIS — I10 Essential (primary) hypertension: Secondary | ICD-10-CM | POA: Diagnosis not present

## 2018-02-26 DIAGNOSIS — Y939 Activity, unspecified: Secondary | ICD-10-CM | POA: Diagnosis not present

## 2018-02-26 DIAGNOSIS — Y92410 Unspecified street and highway as the place of occurrence of the external cause: Secondary | ICD-10-CM | POA: Insufficient documentation

## 2018-02-26 DIAGNOSIS — Y999 Unspecified external cause status: Secondary | ICD-10-CM | POA: Insufficient documentation

## 2018-02-26 DIAGNOSIS — Z79899 Other long term (current) drug therapy: Secondary | ICD-10-CM | POA: Insufficient documentation

## 2018-02-26 MED ORDER — MELOXICAM 15 MG PO TABS: 15.0000 mg | ORAL_TABLET | Freq: Every day | ORAL | 1 refills | Status: AC
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
## 2018-08-15 ENCOUNTER — Encounter (HOSPITAL_COMMUNITY): Payer: Self-pay | Admitting: *Deleted

## 2018-08-15 ENCOUNTER — Emergency Department (HOSPITAL_COMMUNITY)
Admission: EM | Admit: 2018-08-15 | Discharge: 2018-08-15 | Disposition: A | Discharge status: Home / Self Care | Payer: Medicare Other | Attending: Emergency Medicine | Admitting: Emergency Medicine

## 2018-08-15 DIAGNOSIS — Y929 Unspecified place or not applicable: Secondary | ICD-10-CM | POA: Diagnosis not present

## 2018-08-15 DIAGNOSIS — Y939 Activity, unspecified: Secondary | ICD-10-CM | POA: Insufficient documentation

## 2018-08-15 DIAGNOSIS — Y998 Other external cause status: Secondary | ICD-10-CM | POA: Diagnosis not present

## 2018-08-15 DIAGNOSIS — W57XXXA Bitten or stung by nonvenomous insect and other nonvenomous arthropods, initial encounter: Secondary | ICD-10-CM | POA: Insufficient documentation

## 2018-08-15 DIAGNOSIS — S30861A Insect bite (nonvenomous) of abdominal wall, initial encounter: Secondary | ICD-10-CM | POA: Diagnosis present

## 2018-08-15 LAB — COMPREHENSIVE METABOLIC PANEL
ALT: 12 U/L (ref 0–44)
AST: 17 U/L (ref 15–41)
Albumin: 3.1 g/dL — ABNORMAL LOW (ref 3.5–5.0)
Alkaline Phosphatase: 60 U/L (ref 38–126)
Anion gap: 4 — ABNORMAL LOW (ref 5–15)
BUN: 9 mg/dL (ref 6–20)
CO2: 23 mmol/L (ref 22–32)
Calcium: 8.7 mg/dL — ABNORMAL LOW (ref 8.9–10.3)
Chloride: 114 mmol/L — ABNORMAL HIGH (ref 98–111)
Creatinine, Ser: 0.91 mg/dL (ref 0.44–1.00)
GFR calc Af Amer: >60 mL/min (ref >60)
GFR calc non Af Amer: >60 mL/min (ref >60)
Glucose, Bld: 87 mg/dL (ref 70–99)
Potassium: 3.5 mmol/L (ref 3.5–5.1)
Sodium: 141 mmol/L (ref 135–145)
Total Bilirubin: 0.4 mg/dL (ref 0.3–1.2)
Total Protein: 7.2 g/dL (ref 6.5–8.1)

## 2018-08-15 LAB — CBC WITH DIFFERENTIAL/PLATELET
Abs Immature Granulocytes: 0.01 10*3/uL (ref 0.00–0.07)
Basophils Absolute: 0 10*3/uL (ref 0.0–0.1)
Basophils Relative: 1 %
Eosinophils Absolute: 0.1 10*3/uL (ref 0.0–0.5)
Eosinophils Relative: 2 %
HCT: 31.2 % — ABNORMAL LOW (ref 36.0–46.0)
Hemoglobin: 9.6 g/dL — ABNORMAL LOW (ref 12.0–15.0)
Immature Granulocytes: 0 %
Lymphocytes Relative: 50 %
Lymphs Abs: 3.2 10*3/uL (ref 0.7–4.0)
MCH: 27.8 pg (ref 26.0–34.0)
MCHC: 30.8 g/dL (ref 30.0–36.0)
MCV: 90.4 fL (ref 80.0–100.0)
Monocytes Absolute: 0.5 10*3/uL (ref 0.1–1.0)
Monocytes Relative: 9 %
Neutro Abs: 2.4 10*3/uL (ref 1.7–7.7)
Neutrophils Relative %: 38 %
Platelets: 322 10*3/uL (ref 150–400)
RBC: 3.45 MIL/uL — ABNORMAL LOW (ref 3.87–5.11)
RDW: 16.4 % — ABNORMAL HIGH (ref 11.5–15.5)
WBC: 6.3 10*3/uL (ref 4.0–10.5)
nRBC: 0 % (ref 0.0–0.2)

## 2018-08-15 LAB — I-STAT CG4 LACTIC ACID, ED: Lactic Acid, Venous: 1.62 mmol/L (ref 0.5–1.9)

## 2018-08-15 LAB — I-STAT BETA HCG BLOOD, ED (MC, WL, AP ONLY): I-stat hCG, quantitative: <5 m[IU]/mL (ref <5)

## 2018-08-15 NOTE — ED Triage Notes (Signed)
Pt in c/o possible insect bite to her right flank, redness noted, pt has history of abscess but not in an area like that, entire side is tender to touch, denies fever

## 2018-08-15 NOTE — ED Notes (Signed)
Pt stated she was leaving due to wait time.  Pt was observed leaving the ER.

## 2018-09-08 ENCOUNTER — Ambulatory Visit (INDEPENDENT_AMBULATORY_CARE_PROVIDER_SITE_OTHER): Payer: Medicare Other | Admitting: Emergency Medicine

## 2018-09-08 ENCOUNTER — Other Ambulatory Visit: Payer: Self-pay

## 2018-09-08 ENCOUNTER — Encounter: Payer: Self-pay | Admitting: Emergency Medicine

## 2018-09-08 VITALS — BP 109/76 | HR 92 | Temp 98.5°F | Resp 16 | Ht 64.25 in | Wt 317.4 lb

## 2018-09-08 DIAGNOSIS — M545 Low back pain, unspecified: Secondary | ICD-10-CM

## 2018-09-08 DIAGNOSIS — Z23 Encounter for immunization: Secondary | ICD-10-CM | POA: Diagnosis not present

## 2018-09-08 DIAGNOSIS — S39012A Strain of muscle, fascia and tendon of lower back, initial encounter: Secondary | ICD-10-CM

## 2018-09-08 MED ORDER — METHOCARBAMOL 750 MG PO TABS: 750.0000 mg | ORAL_TABLET | Freq: Every day | ORAL | 0 refills | Status: DC | PRN

## 2018-09-08 MED ORDER — IBUPROFEN 600 MG PO TABS: 600.0000 mg | ORAL_TABLET | Freq: Four times a day (QID) | ORAL | 0 refills | Status: DC | PRN

## 2018-09-08 NOTE — Progress Notes (Signed)
Danielle [name] 47 y.o.   Chief Complaint  Patient presents with  . Back Pain    per patient lower and bodyache x 1-2 weeks  . Medication Refill    Methocarbamol    HISTORY OF PRESENT ILLNESS: This is a 47 y.o. female complaining of low back pain for the last 2 weeks.  Denies injuries.  Sharp constant pain with no radiation or associated symptoms.  Worse with movement and better with rest.  First visit to this office.  Past medical history positive for chronic stomach/reflux problems, history of substance abuse in the past, and hypertension.  Chronic back pain.  HPI   Prior to Admission medications   Medication Sig Start Date End Date Taking? Authorizing Provider  acetaminophen (TYLENOL) 650 MG CR tablet Take 650 mg by mouth every 8 (eight) hours as needed for pain.   Yes [provider]  hydrochlorothiazide (HYDRODIURIL) 25 MG tablet Take 25 mg by mouth daily.   Yes [provider]  ibuprofen (ADVIL,MOTRIN) 600 MG tablet Take 1 tablet (600 mg total) by mouth every 6 (six) hours as needed. 01/10/16  Yes Kirichenko, Tatyana, PA-C  omeprazole (PRILOSEC) 20 MG capsule Take 1 capsule (20 mg total) by mouth daily. 05/13/16  Yes Domenic Moras, PA-C  pseudoephedrine (SUDAFED) 30 MG tablet Take 1 tablet (30 mg total) by mouth every 4 (four) hours as needed for congestion. 01/10/16  Yes Kirichenko, Tatyana, PA-C  famotidine (PEPCID) 20 MG tablet Take 1 tablet (20 mg total) by mouth 2 (two) times daily. Patient not taking: Reported on 09/08/2018 05/13/16   Domenic Moras, PA-C  HYDROcodone-acetaminophen (NORCO/VICODIN) 5-325 MG tablet Take 1-2 tablets by mouth every 4 (four) hours as needed for severe pain. Patient not taking: Reported on 09/08/2018 11/21/15   Little, Wenda Overland, MD  lidocaine (XYLOCAINE) 2 % solution Use as directed 5 mLs in the mouth or throat every 6 (six) hours as needed for mouth pain. Oral swish Patient not taking: Reported on 09/08/2018 10/09/16   Sable Feil, PA-C  methocarbamol (ROBAXIN) 750 MG tablet Take 750 mg by mouth daily as needed for muscle spasms.     [provider]  ondansetron (ZOFRAN ODT) 8 MG disintegrating tablet Take 1 tablet (8 mg total) by mouth every 8 (eight) hours as needed for nausea or vomiting. Patient not taking: Reported on 09/08/2018 01/10/16   Jeannett Senior, PA-C  ondansetron (ZOFRAN) 4 MG tablet Take 1 tablet (4 mg total) by mouth every 8 (eight) hours as needed for nausea or vomiting. Patient not taking: Reported on 09/08/2018 05/13/16   Domenic Moras, PA-C  ranitidine (ZANTAC) 150 MG tablet Take 150 mg by mouth 2 (two) times daily.    [provider]    No Known Allergies  Patient Active Problem List   Diagnosis Date Noted  . HYPERTENSION 11/12/2010  . OBESITY 11/12/2010  . COCAINE DEPENDENCE, IN REMISSION 11/12/2010  . DEPRESSION 11/12/2010  . GERD 11/12/2010  . BACK PAIN 11/12/2010    Past Medical History:  Diagnosis Date  . Arthritis   . Bipolar 1 disorder ([place])   . 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[place])    crack-cocaine-11/12 last   . GERD (gastroesophageal reflux disease)   . Hypertension   . Migraines   . Sleep apnea    states had test-no cpap ordered-does snore    Past Surgical History:  Procedure Laterality Date  . FRACTURE SURGERY     rt femer/knee-plate-screws-  . ORIF ANKLE FRACTURE  03/16/2012   Procedure: OPEN REDUCTION INTERNAL FIXATION (ORIF) ANKLE FRACTURE;  Surgeon: Colin Rhein, MD;  Location: [place];  Service: Orthopedics;  Laterality: Right;  right lateral malleolus fracture    Social History   Socioeconomic History  . Marital status: Married    Spouse name: Not on file  . Number of children: Not on file  . Years of education: Not on file  . Highest education level: Not on file  Occupational History  . Not on file  Social Needs  . Financial resource strain: Not on file  . Food insecurity:    Worry: Not on file    Inability:  Not on file  . Transportation needs:    Medical: Not on file    Non-medical: Not on file  Tobacco Use  . Smoking status: Never Smoker  . Smokeless tobacco: Never Used  Substance and Sexual Activity  . Alcohol use: No    Comment: occ  . Drug use: Yes    Types: "Crack" cocaine, Cocaine    Comment: remission 11/12  . Sexual activity: Not on file  Lifestyle  . Physical activity:    Days per week: Not on file    Minutes per session: Not on file  . Stress: Not on file  Relationships  . Social connections:    Talks on phone: Not on file    Gets together: Not on file    Attends religious service: Not on file    Active member of club or organization: Not on file    Attends meetings of clubs or organizations: Not on file    Relationship status: Not on file  . Intimate partner violence:    Fear of current or ex partner: Not on file    Emotionally abused: Not on file    Physically abused: Not on file    Forced sexual activity: Not on file  Other Topics Concern  . Not on file  Social History Narrative  . Not on file    No family history on file.   Review of Systems  Constitutional: Negative.  Negative for chills and fever.  HENT: Negative.  Negative for hearing loss and sore throat.   Eyes: Negative.  Negative for blurred vision and double vision.  Respiratory: Negative.  Negative for cough and shortness of breath.   Cardiovascular: Negative.  Negative for chest pain and palpitations.  Gastrointestinal: Positive for heartburn. Negative for abdominal pain, blood in stool, diarrhea, nausea and vomiting.  Genitourinary: Negative.   Musculoskeletal: Positive for back pain.  Skin: Negative.  Negative for rash.  Neurological: Negative.  Negative for dizziness and headaches.  Endo/Heme/Allergies: Negative.   All other systems reviewed and are negative.   Vitals:   09/08/18 1433  BP: 109/76  Pulse: 92  Resp: 16  Temp: 98.5 F (36.9 C)  SpO2: 98%    Physical Exam    Constitutional: She is oriented to person, place, and time. She appears well-developed.  Morbidly obese  HENT:  Head: Normocephalic and atraumatic.  Nose: Nose normal.  Mouth/Throat: Oropharynx is clear and moist.  Eyes: Pupils are equal, round, and reactive to light. Conjunctivae and EOM are normal.  Neck: Normal range of motion. Neck supple.  Cardiovascular: Normal rate, regular rhythm and normal heart sounds.  Pulmonary/Chest: Effort normal and breath sounds normal.  Abdominal: Soft. There is no tenderness.  Musculoskeletal:       Lumbar back: She exhibits decreased range of motion, tenderness and spasm. She  exhibits no bony tenderness and normal pulse.  Neurological: She is alert and oriented to person, place, and time. No sensory deficit. She exhibits normal muscle tone.  Skin: Skin is warm and dry. Capillary refill takes less than 2 seconds.  Psychiatric: She has a normal mood and affect. Her behavior is normal.  Vitals reviewed.  A total of 30 minutes was spent in the room with the patient, greater than 50% of which was in counseling/coordination of care regarding differential diagnosis, management, medications, treatment, and need for follow-up if not better or worse.   ASSESSMENT & PLAN: Wayne was seen today for back pain and medication refill.  Diagnoses and all orders for this visit:  Lumbar pain -     ibuprofen (ADVIL,MOTRIN) 600 MG tablet; Take 1 tablet (600 mg total) by mouth every 6 (six) hours as needed.  Acute myofascial strain of lumbar region, initial encounter -     methocarbamol (ROBAXIN) 750 MG tablet; Take 1 tablet (750 mg total) by mouth daily as needed for muscle spasms.  Need for diphtheria-tetanus-pertussis (Tdap) vaccine -     Tdap vaccine greater than or equal to 7yo IM      Patient Instructions       If you have lab work done today you will be contacted with your lab results within the next 2 weeks.  If you have not heard from [place] then  please contact us. The fastest way to get your results is to register for My Chart.   IF you received an x-ray today, you will receive an invoice from [facility]. Please contact [facility] at [phone] with questions or concerns regarding your invoice.   IF you received labwork today, you will receive an invoice from [facility]. Please contact [facility] at [phone] with questions or concerns regarding your invoice.   Our billing staff will not be able to assist you with questions regarding bills from these companies.  You will be contacted with the lab results as soon as they are available. The fastest way to get your results is to activate your My Chart account. Instructions are located on the last page of this paperwork. If you have not heard from [place] regarding the results in 2 weeks, please contact this office.     Back Pain, Adult Back pain is very common. The pain often gets better over time. The cause of back pain is usually not dangerous. Most people can learn to manage their back pain on their own. Follow these instructions at home: Watch your back pain for any changes. The following actions may help to lessen any pain you are feeling:  Stay active. Start with short walks on flat ground if you can. Try to walk farther each day.  Exercise regularly as told by your doctor. Exercise helps your back heal faster. It also helps avoid future injury by keeping your muscles strong and flexible.  Do not sit, drive, or stand in one place for more than 30 minutes.  Do not stay in bed. Resting more than 1-2 days can slow down your recovery.  Be careful when you bend or lift an object. Use good form when lifting: ? Bend at your knees. ? Keep the object close to your body. ? Do not twist.  Sleep on a firm mattress. Lie on your side, and bend your knees. If you lie on your back, put a pillow under your knees.  Take medicines only as told by your doctor.  Put ice on  the injured area. ? Put ice in a plastic bag. ? Place a towel between your skin and the bag. ? Leave the ice on for 20 minutes, 2-3 times a day for the first 2-3 days. After that, you can switch between ice and heat packs.  Avoid feeling anxious or stressed. Find good ways to deal with stress, such as exercise.  Maintain a healthy weight. Extra weight puts stress on your back.  Contact a doctor if:  You have pain that does not go away with rest or medicine.  You have worsening pain that goes down into your legs or buttocks.  You have pain that does not get better in one week.  You have pain at night.  You lose weight.  You have a fever or chills. Get help right away if:  You cannot control when you poop (bowel movement) or pee (urinate).  Your arms or legs feel weak.  Your arms or legs lose feeling (numbness).  You feel sick to your stomach (nauseous) or throw up (vomit).  You have belly (abdominal) pain.  You feel like you may pass out (faint). This information is not intended to replace advice given to you by your health care provider. Make sure you discuss any questions you have with your health care provider. Document Released: 03/23/2008 Document Revised: 03/12/2016 Document Reviewed: 02/06/2014 Elsevier Interactive Patient Education  2018 [employer]      Agustina Caroli, MD [facility]

## 2018-09-08 NOTE — Patient Instructions (Addendum)
   If you have lab work done today you will be contacted with your lab results within the next 2 weeks.  If you have not heard from us then please contact us. The fastest way to get your results is to register for My Chart.   IF you received an x-ray today, you will receive an invoice from [facility]. Please contact [facility] at [phone] with questions or concerns regarding your invoice.   IF you received labwork today, you will receive an invoice from [facility]. Please contact [facility] at [phone] with questions or concerns regarding your invoice.   Our billing staff will not be able to assist you with questions regarding bills from these companies.  You will be contacted with the lab results as soon as they are available. The fastest way to get your results is to activate your My Chart account. Instructions are located on the last page of this paperwork. If you have not heard from us regarding the results in 2 weeks, please contact this office.     Back Pain, Adult Back pain is very common. The pain often gets better over time. The cause of back pain is usually not dangerous. Most people can learn to manage their back pain on their own. Follow these instructions at home: Watch your back pain for any changes. The following actions may help to lessen any pain you are feeling:  Stay active. Start with short walks on flat ground if you can. Try to walk farther each day.  Exercise regularly as told by your doctor. Exercise helps your back heal faster. It also helps avoid future injury by keeping your muscles strong and flexible.  Do not sit, drive, or stand in one place for more than 30 minutes.  Do not stay in bed. Resting more than 1-2 days can slow down your recovery.  Be careful when you bend or lift an object. Use good form when lifting: ? Bend at your knees. ? Keep the object close to your body. ? Do not twist.  Sleep on a firm mattress. Lie on your  side, and bend your knees. If you lie on your back, put a pillow under your knees.  Take medicines only as told by your doctor.  Put ice on the injured area. ? Put ice in a plastic bag. ? Place a towel between your skin and the bag. ? Leave the ice on for 20 minutes, 2-3 times a day for the first 2-3 days. After that, you can switch between ice and heat packs.  Avoid feeling anxious or stressed. Find good ways to deal with stress, such as exercise.  Maintain a healthy weight. Extra weight puts stress on your back.  Contact a doctor if:  You have pain that does not go away with rest or medicine.  You have worsening pain that goes down into your legs or buttocks.  You have pain that does not get better in one week.  You have pain at night.  You lose weight.  You have a fever or chills. Get help right away if:  You cannot control when you poop (bowel movement) or pee (urinate).  Your arms or legs feel weak.  Your arms or legs lose feeling (numbness).  You feel sick to your stomach (nauseous) or throw up (vomit).  You have belly (abdominal) pain.  You feel like you may pass out (faint). This information is not intended to replace advice given to you by your health care   provider. Make sure you discuss any questions you have with your health care provider. Document Released: 03/23/2008 Document Revised: 03/12/2016 Document Reviewed: 02/06/2014 Elsevier Interactive Patient Education  2018 Elsevier Inc.  

## 2018-10-06 ENCOUNTER — Ambulatory Visit: Payer: Medicare Other | Admitting: Emergency Medicine

## 2018-10-31 ENCOUNTER — Telehealth: Payer: Self-pay

## 2018-10-31 NOTE — Telephone Encounter (Signed)
Phoned in(left voicemail refill) ibuprofen 600 mg tab #20 1 tab every 6 hrs as needed with 0 refills and omeprazole 20 mg #30 one tab po daily with 0 refills to [facility] [address] [place], [place].  Ok by [place] pt has appt with him on 11/02/2018 at 2:40 pm. Dgaddy, CMA

## 2018-11-02 ENCOUNTER — Ambulatory Visit: Payer: Medicare Other | Admitting: Emergency Medicine

## 2018-11-11 ENCOUNTER — Ambulatory Visit: Payer: Medicare Other | Admitting: Podiatry

## 2018-11-18 ENCOUNTER — Other Ambulatory Visit: Payer: Self-pay | Admitting: Emergency Medicine

## 2018-11-18 ENCOUNTER — Other Ambulatory Visit: Payer: Self-pay | Admitting: Podiatry

## 2018-11-18 ENCOUNTER — Ambulatory Visit (INDEPENDENT_AMBULATORY_CARE_PROVIDER_SITE_OTHER): Payer: Medicare Other

## 2018-11-18 ENCOUNTER — Ambulatory Visit (INDEPENDENT_AMBULATORY_CARE_PROVIDER_SITE_OTHER): Payer: Medicare Other | Admitting: Podiatry

## 2018-11-18 ENCOUNTER — Ambulatory Visit: Payer: Medicare Other

## 2018-11-18 ENCOUNTER — Encounter: Payer: Self-pay | Admitting: Podiatry

## 2018-11-18 VITALS — BP 101/65 | HR 64 | Resp 16 | Ht 64.0 in | Wt 334.0 lb

## 2018-11-18 DIAGNOSIS — M25571 Pain in right ankle and joints of right foot: Secondary | ICD-10-CM

## 2018-11-18 DIAGNOSIS — M545 Low back pain, unspecified: Secondary | ICD-10-CM

## 2018-11-18 DIAGNOSIS — M779 Enthesopathy, unspecified: Secondary | ICD-10-CM | POA: Diagnosis not present

## 2018-11-18 MED ORDER — TRIAMCINOLONE ACETONIDE 10 MG/ML IJ SUSP
10.0000 mg | Freq: Once | INTRAMUSCULAR | Status: AC
  Administered 2018-11-18: 10 mg

## 2018-11-18 NOTE — Progress Notes (Signed)
   Subjective:    Patient ID: Danielle [name], female    DOB: June 16, 1971, 48 y.o.   MRN: [id number]  HPI    Review of Systems  All other systems reviewed and are negative.      Objective:   Physical Exam        Assessment & Plan:

## 2018-11-18 NOTE — Progress Notes (Signed)
Subjective:   Patient ID: Danielle [name], female   DOB: 48 y.o.   MRN: [id number]   HPI Patient presents stating she has a lot of pain in the outside of her right foot and is making it hard to walk and she does not remember injury.  Patient states she had an ankle fracture years ago and feels almost like the screws are coming out and patient does not smoke is obese and would like to be active   Review of Systems  All other systems reviewed and are negative.       Objective:  Physical Exam Vitals signs and nursing note reviewed.  Constitutional:      Appearance: She is well-developed.  Pulmonary:     Effort: Pulmonary effort is normal.  Musculoskeletal: Normal range of motion.  Skin:    General: Skin is warm.  Neurological:     Mental Status: She is alert.     Neurovascular status intact muscle strength is adequate range of motion within normal limits with patient found to have exquisite discomfort of the sinus tarsi right with inflammation fluid buildup with moderate depression of the arch noted.  Has good digital perfusion well oriented x3     Assessment:  Acute sinus tarsitis right with inflammation fluid buildup noted with moderate depression of the arch     Plan:  H&P x-ray reviewed and today I did sterile prep and then injected directly into the sinus tarsi right 3 mg Kenalog 5 mg Xylocaine and applied fascial brace to lift up and support the arch.  Reappoint for [place] to recheck  X-ray indicates no signs of aggressive osteoarthritis with moderate depression of the arch and history of fracture fibula with plate intact

## 2019-05-03 ENCOUNTER — Other Ambulatory Visit: Payer: Self-pay | Admitting: Physician Assistant

## 2019-05-03 DIAGNOSIS — M542 Cervicalgia: Secondary | ICD-10-CM

## 2019-05-14 ENCOUNTER — Other Ambulatory Visit: Payer: Medicare Other

## 2019-05-25 ENCOUNTER — Other Ambulatory Visit: Payer: Medicare Other

## 2019-05-30 ENCOUNTER — Other Ambulatory Visit: Payer: Self-pay | Admitting: Emergency Medicine

## 2019-05-30 DIAGNOSIS — S39012A Strain of muscle, fascia and tendon of lower back, initial encounter: Secondary | ICD-10-CM

## 2019-09-07 ENCOUNTER — Ambulatory Visit: Payer: Medicare Other | Admitting: Podiatry

## 2019-09-21 ENCOUNTER — Ambulatory Visit: Payer: Medicare Other | Admitting: Podiatry

## 2019-11-29 ENCOUNTER — Other Ambulatory Visit: Payer: Self-pay | Admitting: Physician Assistant

## 2019-11-29 DIAGNOSIS — Z1231 Encounter for screening mammogram for malignant neoplasm of breast: Secondary | ICD-10-CM

## 2020-01-08 ENCOUNTER — Ambulatory Visit: Payer: Medicare Other

## 2020-01-10 ENCOUNTER — Emergency Department (HOSPITAL_COMMUNITY)
Admission: EM | Admit: 2020-01-10 | Discharge: 2020-01-10 | Disposition: A | Discharge status: Home / Self Care | Payer: Medicare Other | Attending: Emergency Medicine | Admitting: Emergency Medicine

## 2020-01-10 ENCOUNTER — Emergency Department (HOSPITAL_COMMUNITY): Payer: Medicare Other

## 2020-01-10 ENCOUNTER — Other Ambulatory Visit: Payer: Self-pay

## 2020-01-10 ENCOUNTER — Encounter (HOSPITAL_COMMUNITY): Payer: Self-pay | Admitting: Emergency Medicine

## 2020-01-10 DIAGNOSIS — Z79899 Other long term (current) drug therapy: Secondary | ICD-10-CM | POA: Diagnosis not present

## 2020-01-10 DIAGNOSIS — R0602 Shortness of breath: Secondary | ICD-10-CM | POA: Diagnosis present

## 2020-01-10 DIAGNOSIS — I10 Essential (primary) hypertension: Secondary | ICD-10-CM | POA: Diagnosis not present

## 2020-01-10 DIAGNOSIS — J45909 Unspecified asthma, uncomplicated: Secondary | ICD-10-CM | POA: Insufficient documentation

## 2020-01-10 DIAGNOSIS — F419 Anxiety disorder, unspecified: Secondary | ICD-10-CM | POA: Diagnosis not present

## 2020-01-10 DIAGNOSIS — Z20822 Contact with and (suspected) exposure to covid-19: Secondary | ICD-10-CM

## 2020-01-10 DIAGNOSIS — J810 Acute pulmonary edema: Secondary | ICD-10-CM | POA: Diagnosis not present

## 2020-01-10 LAB — POC SARS CORONAVIRUS 2 AG -  ED: SARS Coronavirus 2 Ag: NEGATIVE

## 2020-01-10 LAB — SARS CORONAVIRUS 2 (TAT 6-24 HRS): SARS Coronavirus 2: NEGATIVE

## 2020-01-10 MED ORDER — LORAZEPAM 1 MG PO TABS
1.0000 mg | ORAL_TABLET | Freq: Once | ORAL | Status: AC
  Administered 2020-01-10: 10:00:00 1 mg via ORAL
  Filled 2020-01-10: qty 1

## 2020-01-10 MED ORDER — FUROSEMIDE 20 MG PO TABS
20.0000 mg | ORAL_TABLET | Freq: Once | ORAL | Status: AC
  Administered 2020-01-10: 20 mg via ORAL
  Filled 2020-01-10: qty 1

## 2020-01-10 MED ORDER — PREDNISONE 10 MG (21) PO TBPK: ORAL_TABLET | ORAL | 0 refills | Status: DC

## 2020-01-10 MED ORDER — DEXAMETHASONE SODIUM PHOSPHATE 10 MG/ML IJ SOLN
10.0000 mg | Freq: Once | INTRAMUSCULAR | Status: AC
  Administered 2020-01-10: 10 mg via INTRAMUSCULAR
  Filled 2020-01-10: qty 1

## 2020-01-10 MED ORDER — FUROSEMIDE 20 MG PO TABS: 20.0000 mg | ORAL_TABLET | Freq: Every day | ORAL | 0 refills | Status: DC

## 2020-01-10 NOTE — ED Triage Notes (Signed)
Onset one day ago developed dry cough took OTC medication. States became nervous and at times shortness of breath because family telling her to get checked by a doctor. Denies shortness of breath currently.

## 2020-01-10 NOTE — ED Provider Notes (Signed)
[facility] EMERGENCY DEPARTMENT Provider Note   CSN: [id number] Arrival date & time: 01/10/20  [id number]     History Chief Complaint  Patient presents with  . Anxiety  . Cough    Danielle [name] is a 49 y.o. female.  Pt presents to the ED today with sob and cough.  The pt thinks she was exposed to Covid.  She has a hx of asthma and has been using her nebs.  She is very nervous and took a Xanax.  Pt denies any fevers.  She has not had any vaccine yet.        Past Medical History:  Diagnosis Date  . Arthritis   . Bipolar 1 disorder ([place])   . 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 GERD (gastroesophageal reflux disease)   . Hypertension   . Migraines   . Sleep apnea    states had test-no cpap ordered-does snore    Patient Active Problem List   Diagnosis Date Noted  . Closed nondisplaced fracture of neck of fifth metacarpal bone of right hand with routine healing 12/13/2015  . HYPERTENSION 11/12/2010  . OBESITY 11/12/2010  . COCAINE DEPENDENCE, IN REMISSION 11/12/2010  . DEPRESSION 11/12/2010  . GERD 11/12/2010  . BACK PAIN 11/12/2010    Past Surgical History:  Procedure Laterality Date  . FRACTURE SURGERY     rt femer/knee-plate-screws-  . ORIF ANKLE FRACTURE  03/16/2012   Procedure: OPEN REDUCTION INTERNAL FIXATION (ORIF) ANKLE FRACTURE;  Surgeon: Colin Rhein, MD;  Location: [facility];  Service: Orthopedics;  Laterality: Right;  right lateral malleolus fracture     OB History   No obstetric history on file.     No family history on file.  Social History   Tobacco Use  . Smoking status: Never Smoker  . Smokeless tobacco: Never Used  Substance Use Topics  . Alcohol use: No    Comment: occ  . Drug use: Yes    Types: "Crack" cocaine, Cocaine    Comment: remission 11/12    Home Medications Prior to Admission medications   Medication Sig Start Date End Date Taking? Authorizing Provider    acetaminophen (TYLENOL) 650 MG CR tablet Take 650 mg by mouth every 8 (eight) hours as needed for pain.    [provider]  famotidine (PEPCID) 20 MG tablet Take 1 tablet (20 mg total) by mouth 2 (two) times daily. 05/13/16   Domenic Moras, PA-C  furosemide (LASIX) 20 MG tablet Take 1 tablet (20 mg total) by mouth daily. 01/10/20   Isla Pence, MD  hydrochlorothiazide (HYDRODIURIL) 25 MG tablet Take 25 mg by mouth daily.    [provider]  ibuprofen (ADVIL,MOTRIN) 600 MG tablet TAKE 1 TABLET BY MOUTH EVERY 6 HOURS AS NEEDED 11/18/18   Horald Pollen, MD  methocarbamol (ROBAXIN) 750 MG tablet Take 1 tablet (750 mg total) by mouth daily as needed for muscle spasms. 09/08/18   Horald Pollen, MD  omeprazole (PRILOSEC) 20 MG capsule Take 1 capsule (20 mg total) by mouth daily. 05/13/16   Domenic Moras, PA-C  predniSONE (STERAPRED UNI-PAK 21 TAB) 10 MG (21) TBPK tablet Take 6 tabs for 2 days, then 5 for 2 days, then 4 for 2 days, then 3 for 2 days, 2 for 2 days, then 1 for 2 days 01/10/20   Isla Pence, MD    Allergies    Patient has no known allergies.  Review of Systems   Review of Systems  Respiratory: Positive for cough and shortness of breath.   Psychiatric/Behavioral: The patient is nervous/anxious.   All other systems reviewed and are negative.   Physical Exam Updated Vital Signs BP (!) 143/90 (BP Location: Right Arm)   Pulse 72   Temp 98.1 F (36.7 C) (Oral)   Resp (!) 24   Ht 5\' 4"  (1.626 m)   Wt (!) 165.1 kg   SpO2 97%   BMI 62.48 kg/m   Physical Exam Vitals and nursing note reviewed.  Constitutional:      Appearance: Normal appearance. She is obese.  HENT:     Head: Normocephalic and atraumatic.     Right Ear: External ear normal.     Left Ear: External ear normal.     Nose: Nose normal.     Mouth/Throat:     Mouth: Mucous membranes are moist.     Pharynx: Oropharynx is clear.  Eyes:     Extraocular Movements: Extraocular movements  intact.     Conjunctiva/sclera: Conjunctivae normal.     Pupils: Pupils are equal, round, and reactive to light.  Cardiovascular:     Rate and Rhythm: Normal rate and regular rhythm.     Pulses: Normal pulses.     Heart sounds: Normal heart sounds.  Pulmonary:     Effort: Tachypnea present.  Abdominal:     General: Abdomen is flat. Bowel sounds are normal.     Palpations: Abdomen is soft.  Musculoskeletal:        General: Normal range of motion.     Cervical back: Normal range of motion and neck supple.  Skin:    General: Skin is warm.     Capillary Refill: Capillary refill takes less than 2 seconds.  Neurological:     General: No focal deficit present.     Mental Status: She is alert and oriented to person, place, and time.  Psychiatric:        Mood and Affect: Mood is anxious.     ED Results / Procedures / Treatments   Labs (all labs ordered are listed, but only abnormal results are displayed) Labs Reviewed  SARS CORONAVIRUS 2 (TAT 6-24 HRS)  POC SARS CORONAVIRUS 2 AG -  ED    EKG None  Radiology DG Chest Portable 1 View  Result Date: 01/10/2020 CLINICAL DATA:  Increased shortness of breath. EXAM: PORTABLE CHEST 1 VIEW COMPARISON:  05/13/2016 FINDINGS: Enlarged cardiac silhouette with a mild increase in size. Increased prominence of the interstitial markings. No visible pleural fluid. Thoracic spine degenerative changes. IMPRESSION: Mildly progressive cardiomegaly with mild interstitial pulmonary edema or interstitial pneumonitis. Electronically Signed   By: Claudie Revering M.D.   On: 01/10/2020 09:57    Procedures Procedures (including critical care time)  Medications Ordered in ED Medications  furosemide (LASIX) tablet 20 mg (has no administration in time range)  dexamethasone (DECADRON) injection 10 mg (10 mg Intramuscular Given 01/10/20 0944)  LORazepam (ATIVAN) tablet 1 mg (1 mg Oral Given 01/10/20 [id number])    ED Course  I have reviewed the triage vital signs and  the nursing notes.  Pertinent labs & imaging results that were available during my care of the patient were reviewed by me and considered in my medical decision making (see chart for details).    MDM Rules/Calculators/A&P                      POC covid negative.  PCR Covid done prior to d/c.  Pt told to self isolate until test result comes back.   CXR is noteworthy for mild pulmonary edema and progressive cardiomegaly.  ECHO ordered as an outpatient.  Low dose lasix (20 mg) started.  Pt knows to f/u with pcp.  Return if worse.  Final Clinical Impression(s) / ED Diagnoses Final diagnoses:  Person under investigation for COVID-19  Acute pulmonary edema ([place])    Rx / DC Orders ED Discharge Orders         Ordered    predniSONE (STERAPRED UNI-PAK 21 TAB) 10 MG (21) TBPK tablet     01/10/20 1027    furosemide (LASIX) 20 MG tablet  Daily     01/10/20 1027    ECHOCARDIOGRAM COMPLETE     01/10/20 1028           Isla Pence, MD 01/10/20 [phone]

## 2020-01-11 ENCOUNTER — Telehealth (HOSPITAL_COMMUNITY): Payer: Self-pay

## 2020-01-12 ENCOUNTER — Ambulatory Visit (INDEPENDENT_AMBULATORY_CARE_PROVIDER_SITE_OTHER): Payer: Medicare Other | Admitting: Orthopaedic Surgery

## 2020-01-12 ENCOUNTER — Encounter: Payer: Self-pay | Admitting: Orthopaedic Surgery

## 2020-01-12 ENCOUNTER — Other Ambulatory Visit: Payer: Self-pay

## 2020-01-12 ENCOUNTER — Ambulatory Visit: Payer: Self-pay

## 2020-01-12 VITALS — Ht 64.5 in | Wt 348.0 lb

## 2020-01-12 DIAGNOSIS — M1712 Unilateral primary osteoarthritis, left knee: Secondary | ICD-10-CM | POA: Diagnosis not present

## 2020-01-12 NOTE — Progress Notes (Signed)
Subjective: She is here for ultrasound-guided left knee injection.  She is super morbidly obese making palpation guided injection very difficult.  Objective: There is no warmth or erythema in the knee.  Procedure: Ultrasound-guided left knee injection: After sterile prep with Betadine, injected 5 cc 1% lidocaine without epinephrine and 40 mg methylprednisolone into the lateral joint recess.  She will follow up with Dr. Erlinda Hong.

## 2020-01-12 NOTE — Progress Notes (Signed)
Office Visit Note   [name]           Date of Birth: 08-Feb-1971           MRN: [id number] Visit Date: 01/12/2020              Requested by: Danielle Pollen, MD [place],  [place] [id number] PCP: Danielle Pollen, MD   Assessment & Plan: Visit Diagnoses:  1. Primary osteoarthritis of left knee   2. Morbid obesity ([place])     Plan: Impression is left knee degenerative joint disease.  We have discussed cortisone injection and weight loss.  We will refer her to Dr. Junius [name] for ultrasound-guided cortisone injection to the left knee.  She will need to get to 230 pounds which will put her at a BMI of under 40 before proceeding with total knee arthroplasty which she asked about during today's visit.  Follow-Up Instructions: Return if symptoms worsen or fail to improve.   Orders:  Orders Placed This Encounter  Procedures  . XR KNEE 3 VIEW LEFT   No orders of the defined types were placed in this encounter.     Procedures: No procedures performed   Clinical Data: No additional findings.   Subjective: Chief Complaint  Patient presents with  . Left Knee - Pain    HPI patient is a 49 year old female who comes in today with left knee pain for the past 2 years.  No specific injury.  Her pain has progressively worsened.  Pain she has is to the entire left knee and is described as a constant ache.  Pain is aggravated going from a seated to standing position.  She has been using a pain cream without relief of symptoms.  No previous surgical intervention or injection to the left knee.  Review of Systems as detailed in HPI.  All others reviewed and are negative.   Objective: Vital Signs: Ht 5' 4.5" (1.638 m)   Wt (!) 348 lb (157.9 kg)   BMI 58.81 kg/m   Physical Exam well-developed well-nourished female no acute distress.  Alert and oriented x3.  Ortho Exam she does have exogenous obesity.  Left lower extremity somewhat hard to examine due  to this.  She does appear to have medial lateral joint line tenderness.  Range of motion 0 to about 90 degrees.  She is neurovascularly intact distally.  Specialty Comments:  No specialty comments available.  Imaging: XR KNEE 3 VIEW LEFT  Result Date: 01/12/2020 Moderate degenerative changes medial and patellofemoral compartments    PMFS History: Patient Active Problem List   Diagnosis Date Noted  . Closed nondisplaced fracture of neck of fifth metacarpal bone of right hand with routine healing 12/13/2015  . HYPERTENSION 11/12/2010  . OBESITY 11/12/2010  . COCAINE DEPENDENCE, IN REMISSION 11/12/2010  . DEPRESSION 11/12/2010  . GERD 11/12/2010  . BACK PAIN 11/12/2010   Past Medical History:  Diagnosis Date  . Arthritis   . Bipolar 1 disorder ([place])   . 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 GERD (gastroesophageal reflux disease)   . Hypertension   . Migraines   . Sleep apnea    states had test-no cpap ordered-does snore    History reviewed. No pertinent family history.  Past Surgical History:  Procedure Laterality Date  . FRACTURE SURGERY     rt femer/knee-plate-screws-  . ORIF ANKLE FRACTURE  03/16/2012   Procedure: OPEN REDUCTION INTERNAL FIXATION (ORIF)  ANKLE FRACTURE;  Surgeon: Danielle Rhein, MD;  Location: [facility];  Service: Orthopedics;  Laterality: Right;  right lateral malleolus fracture   Social History   Occupational History  . Not on file  Tobacco Use  . Smoking status: Never Smoker  . Smokeless tobacco: Never Used  Substance and Sexual Activity  . Alcohol use: No    Comment: occ  . Drug use: Yes    Types: "Crack" cocaine, Cocaine    Comment: remission 11/12  . Sexual activity: Not on file

## 2020-01-15 ENCOUNTER — Other Ambulatory Visit: Payer: Self-pay

## 2020-01-16 ENCOUNTER — Ambulatory Visit: Payer: Medicare Other

## 2020-01-17 ENCOUNTER — Ambulatory Visit (HOSPITAL_COMMUNITY): Admission: RE | Admit: 2020-01-17 | Payer: Medicare Other | Source: Ambulatory Visit

## 2020-01-18 ENCOUNTER — Telehealth: Payer: Self-pay

## 2020-01-18 NOTE — Telephone Encounter (Signed)
I would probably follow her advice.  Anything is good to try though.

## 2020-01-18 NOTE — Telephone Encounter (Signed)
Pt called and wants to talk to Dr. Phoebe Sharps team about what other options she has for weight loss. Ie: Product manager # [phone]

## 2020-01-18 NOTE — Telephone Encounter (Signed)
Dr. Leafy Ro if we have not already sent referral there

## 2020-01-18 NOTE — Telephone Encounter (Signed)
Referral made already.

## 2020-01-19 ENCOUNTER — Other Ambulatory Visit: Payer: Self-pay

## 2020-01-19 ENCOUNTER — Ambulatory Visit (HOSPITAL_COMMUNITY)
Admission: RE | Admit: 2020-01-19 | Discharge: 2020-01-19 | Disposition: A | Discharge status: Home / Self Care | Payer: Medicare Other | Source: Ambulatory Visit | Attending: Emergency Medicine | Admitting: Emergency Medicine

## 2020-01-19 ENCOUNTER — Other Ambulatory Visit (HOSPITAL_COMMUNITY): Payer: Medicare Other

## 2020-01-19 DIAGNOSIS — R0602 Shortness of breath: Secondary | ICD-10-CM

## 2020-01-19 DIAGNOSIS — R079 Chest pain, unspecified: Secondary | ICD-10-CM | POA: Diagnosis not present

## 2020-01-19 DIAGNOSIS — R0600 Dyspnea, unspecified: Secondary | ICD-10-CM | POA: Insufficient documentation

## 2020-01-22 ENCOUNTER — Telehealth: Payer: Self-pay | Admitting: *Deleted

## 2020-01-22 NOTE — Telephone Encounter (Signed)
-----   Message from Leandrew Koyanagi, MD sent at 01/18/2020  5:29 PM EDT ----- Regarding: RE: Referral to Bariatric Surgery Thank you Levada Dy.  We will refer patient to Dr. Leafy Ro or Juleen China.   ----- Message ----- From: Cecelia Byars Sent: 01/18/2020   8:15 AM EDT To: Leandrew Koyanagi, MD, Maureen Chatters, CMA Subject: Referral to Bariatric Surgery                  Good Morning!  I spoke to Ms. Zaldivar last night.  She is not interested in surgical options for weight loss.  She stated she preferred medical management and perhaps physical therapy to come to her house to show her things she could do for exercise since she is disabled but is definitely against surgery at this time.  I told her I would advise you and you would let her know her options.  She may benefit from the [facility] with Dr. Leafy Ro or Dr, Juleen China.  They specialize in medical management for weight loss.  We sincerely appreciate the referral!  Let me know if I may be of further assistance.    Haynes Hoehn, Bariatric [facility] [address] [place], [place] [address] Phone:  [phone] Fax:  [phone]

## 2020-01-22 NOTE — Telephone Encounter (Signed)
Referral sent to [facility] office

## 2020-02-02 ENCOUNTER — Other Ambulatory Visit: Payer: Self-pay | Admitting: Gastroenterology

## 2020-02-09 ENCOUNTER — Ambulatory Visit: Payer: Medicare Other | Attending: Internal Medicine

## 2020-02-09 ENCOUNTER — Ambulatory Visit: Payer: Self-pay

## 2020-02-09 DIAGNOSIS — Z23 Encounter for immunization: Secondary | ICD-10-CM

## 2020-02-09 NOTE — Progress Notes (Signed)
   Covid-19 Vaccination Clinic  Name:  Danielle [name]    MRN: [id number] DOB: Feb 19, 1971  02/09/2020  [name] was observed post Covid-19 immunization for 15 minutes without incident. She was provided with Vaccine Information Sheet and instruction to access the V-Safe system.   [name] was instructed to call 911 with any severe reactions post vaccine: Marland Kitchen Difficulty breathing  . Swelling of face and throat  . A fast heartbeat  . A bad rash all over body  . Dizziness and weakness   Immunizations Administered    Name Date Dose VIS Date Route   Pfizer COVID-19 Vaccine 02/09/2020  9:01 AM 0.3 mL 12/13/2018 Intramuscular   Manufacturer: [place]   Lot: [id number]   [place]: [id number]

## 2020-03-04 ENCOUNTER — Ambulatory Visit: Payer: Medicare Other | Attending: Internal Medicine

## 2020-03-05 ENCOUNTER — Other Ambulatory Visit (HOSPITAL_COMMUNITY): Payer: Medicare Other

## 2020-03-05 ENCOUNTER — Other Ambulatory Visit (HOSPITAL_COMMUNITY)
Admission: RE | Admit: 2020-03-05 | Discharge: 2020-03-05 | Disposition: A | Discharge status: Home / Self Care | Payer: Medicare Other | Source: Ambulatory Visit | Attending: Gastroenterology | Admitting: Gastroenterology

## 2020-03-05 DIAGNOSIS — Z20822 Contact with and (suspected) exposure to covid-19: Secondary | ICD-10-CM | POA: Insufficient documentation

## 2020-03-05 DIAGNOSIS — Z01812 Encounter for preprocedural laboratory examination: Secondary | ICD-10-CM | POA: Diagnosis present

## 2020-03-05 LAB — SARS CORONAVIRUS 2 (TAT 6-24 HRS): SARS Coronavirus 2: NEGATIVE

## 2020-03-07 NOTE — H&P (Signed)
History of Present Illness  General:          49 year old female with a BMI over 50 was referred for a screening colonoscopy.        She c/o buring epigastrium, upper mid abdomen, present for the past few years,improves with deep pressure, she has been on pantoprazole and famotidine for years without relief, she has tried liquid carafate, so far no medications have helped.        She denies weight loss, she has vomiting after eating, she sticks her finger in her throat to throw up and has been doing it since she was a child.        Denies nausea.        She feels that food sits in her lower chest and doesn't go down, she needs fluids to push food down.        She feels she has constipated and reports hemorrhoids.She doesn't need a laxative and reports having BM in 2-3 days, stools are hard, ball like and she need to strain.        She notices blood in stool, small in amount, when she wipes, she notices blood intermittently and feels that is related to hemorrhoids.        She has early satiety and c/o bloating.        SHe drinks a lot of sodas, at least a litre a day, doesnt' drink tea or coffee, doesn't smoke or drink alcohol, she normally finishes dinner by 9 pm and bedtime is immediately after eating.        No prior EGD and colonoscopy.        There is family history of colon cancer in her father at 37 and died from it.  Current Medications  TakingAlbuterol Sulfate (2.5 MG/3ML) 0.083% Nebulization Solution USE 3 ML VIA NEBULIZER EVERY 6 HOURS Inhalation    Albuterol Sulfate HFA 108 (90 Base) MCG/ACT Aerosol Solution INHALE 2 PUFFS BY MOUTH FOUR TIMES DAILY Inhalation    Cyclobenzaprine HCl 10 MG Tablet TAKE 1 TABLET BY MOUTH THREE TIMES DAILY AS NEEDED Oral    Furosemide 20 MG Tablet Oral    Gabapentin 300 MG Capsule TAKE 1 CAPSULE BY MOUTH EVERY NIGHT AT BEDTIME Oral    Hydrochlorothiazide 25 MG Tablet TAKE 1 TABLET BY MOUTH EVERY DAY Oral    HydrOXYzine HCl 25 MG Tablet TAKE 1/2 TABLET  BY MOUTH TWICE DAILY FOR ANXIETY OR SLEEP Oral    Pantoprazole Sodium 40 MG Tablet Delayed Release TAKE 1 TABLET BY MOUTH EVERY DAY Oral    Pepcid(Famotidine) 20 MG Tablet 2 tablets Orally BID    PredniSONE 10 MG Tablet Oral    Sertraline HCl 100 MG Tablet TAKE 1 TABLET BY MOUTH EVERY NIGHT AT BEDTIME FOR OCD/DEPRESSION Oral    Sucralfate 1 GM/10ML Suspension TAKE 10 ML BY MOUTH FOUR TIMES DAILY BEFORE MEALS AND AT BEDTIME Oral    Wixela Inhub(Fluticasone-Salmeterol) 250-50 MCG/DOSE Aerosol Powder Breath Activated INHALE 1 PUFF BY MOUTH TWICE DAILY Inhalation    Not-TakingAcetaminophen-Codeine #3 300-30 MG Tablet (Schedule III Drug) TK 1 T PO Q 6-8 H PRF PAIN Oral    Amoxicillin 500 MG Capsule TK 1 C PO Q 8 H FOR 7 DAYS Oral    Azithromycin 250 MG Tablet Oral    Chlorhexidine Gluconate 0.12 % Solution Mouth/Throat    Dexamethasone 1 MG Tablet Oral    Hydrocodone-Acetaminophen 5-325 MG Tablet (Schedule II Drug) TK 1 T PO Q 4 TO  6 H PRF PAIN Oral    Omeprazole 40 MG Capsule Delayed Release TK 1 C PO QD Oral    Sulindac 200 MG Tablet TAKE 1 TABLET BY MOUTH TWICE DAILY Oral    Topiramate 100 MG Tablet TAKE 1 TABLET BY MOUTH AT BEDTIME FOR MOOD Oral    Medication List reviewed and reconciled with the patient          Past Medical History       Arthritis.        Hypertension.       Surgical History  Ankle Surgery 2013  Knee Surgery 2003      Family History  Father: deceased, diagnosed with Colon cancer  Mother: deceased  No Family History od Colon Polyps, or Liver Disease.      Social History  General:   Tobacco use       cigarettes:  Never smoked     Tobacco history last updated  02/02/2020 no Alcohol.  no Recreational drug use.     Allergies  N.K.D.A.      Hospitalization/Major Diagnostic Procedure  Not in past year 01/2020      Review of Systems  GI PROCEDURE:          no Pacemaker/ AICD, no.  no Artificial heart valves.  no MI/heart attack.  no  Abnormal heart rhythm.  no Angina.  no CVA.  Hypertension  YES.  no Hypotension.  no Asthma, COPD.  no Sleep apnea.  no Seizure disorders.  no Artificial joints.  Severe DJD  YES.  no Diabetes.  no Significant headaches.  no Vertigo.  Depression/anxiety  YES.  no Abnormal bleeding.  no Kidney Disease.  no Liver disease, no.  no Blood transfusion.             Vital Signs  Wt 360, Ht 64, BMI 61.79, Temp 98.0, Pulse sitting 77, BP sitting 121/70 Wt per pt/HL,CMA.    Examination  Gastroenterology::        GENERAL APPEARANCE: Well developed, obese, pleasant, no acute distress .         SCLERA: anicteric.         CARDIOVASCULAR PMI LS border. Normal RRR w/o murmers or gallops. No peripheral edema.         RESPIRATORY Breath sounds normal. Respiration even and unlabored.         ABDOMEN No masses palpated. Liver and spleen not palpated, normal. Bowel sounds normal, Abdomen not distended.         EXTREMITIES: No edema.         NEURO: alert,oriented to time,place and person, normal gait.         PSYCH: mood/affect normal.      Assessments    1. Family history of colon cancer in father - Z80.0 (Primary)  2. Epigastric pain - R10.13  3. Esophageal dysphagia - R13.10  4. Constipation, unspecified constipation type - K59.00  5. Screen for colon cancer - Z12.11  6. Morbid obesity - E66.01    Treatment  1. Family history of colon cancer in father        IMAGING: Colonoscopy            Powell,Amy 02/02/2020 09:08:02 AM > Pt scheduled for Colon/EGD-DIL 5/21 Dorise Bullion # [id number] - gave pt instructions, rx, consents.   Notes: Advised patient that she is at a higher risk for colon cancer and ideally should have started colonoscopy at age 36 and continued every 5 years.  2. Epigastric pain        IMAGING: EGD w/ DILATATION            Powell,Amy 02/02/2020 09:08:02 AM > Pt scheduled for Colon/EGD-DIL 5/21 Dorise Bullion # [id number] - gave pt instructions, rx, consents.   Notes: She has tried  pantoprazole, famoditine and sucralfate.  Recommend diagnostic EGD and small bowel and antral biopsies.      3. Esophageal dysphagia        IMAGING: EGD w/ DILATATION            Notes: Recommend EGD with esophageal biopsies and possible balloon dilation.      4. Constipation, unspecified constipation type   Notes: Advised to take benefiber 1 tablespoon in 8 oz of water BID, high fiber diet and increase intake of water to at least 60-80 oz a day.      5. Screen for colon cancer        IMAGING: Colonoscopy            Notes: The risks and the benefits of the procedure were discussed with the patient in details.She understands and verbalized consent. She will be given written instructions, prescription for preparation and will be scheduled for the same.      6. Morbid obesity   Notes: Patient states she has been intentionally putting her finger in her throat to vomit food since she was a child which is compatible with bulimia and recommend psychiatry consult.

## 2020-03-08 ENCOUNTER — Other Ambulatory Visit: Payer: Self-pay

## 2020-03-08 ENCOUNTER — Encounter (HOSPITAL_COMMUNITY)
Admission: RE | Disposition: A | Discharge status: Home / Self Care | Payer: Self-pay | Source: Home / Self Care | Attending: Gastroenterology

## 2020-03-08 ENCOUNTER — Ambulatory Visit (HOSPITAL_COMMUNITY): Payer: Medicare Other | Admitting: Anesthesiology

## 2020-03-08 ENCOUNTER — Ambulatory Visit (HOSPITAL_COMMUNITY)
Admission: RE | Admit: 2020-03-08 | Discharge: 2020-03-08 | Disposition: A | Discharge status: Home / Self Care | Payer: Medicare Other | Attending: Gastroenterology | Admitting: Gastroenterology

## 2020-03-08 ENCOUNTER — Encounter (HOSPITAL_COMMUNITY): Payer: Self-pay | Admitting: Gastroenterology

## 2020-03-08 DIAGNOSIS — I10 Essential (primary) hypertension: Secondary | ICD-10-CM | POA: Insufficient documentation

## 2020-03-08 DIAGNOSIS — Z6841 Body Mass Index (BMI) 40.0 and over, adult: Secondary | ICD-10-CM | POA: Insufficient documentation

## 2020-03-08 DIAGNOSIS — Z7951 Long term (current) use of inhaled steroids: Secondary | ICD-10-CM | POA: Diagnosis not present

## 2020-03-08 DIAGNOSIS — G4730 Sleep apnea, unspecified: Secondary | ICD-10-CM | POA: Insufficient documentation

## 2020-03-08 DIAGNOSIS — Z7952 Long term (current) use of systemic steroids: Secondary | ICD-10-CM | POA: Insufficient documentation

## 2020-03-08 DIAGNOSIS — B9681 Helicobacter pylori [H. pylori] as the cause of diseases classified elsewhere: Secondary | ICD-10-CM | POA: Insufficient documentation

## 2020-03-08 DIAGNOSIS — K648 Other hemorrhoids: Secondary | ICD-10-CM | POA: Diagnosis not present

## 2020-03-08 DIAGNOSIS — K2970 Gastritis, unspecified, without bleeding: Secondary | ICD-10-CM | POA: Diagnosis not present

## 2020-03-08 DIAGNOSIS — R6881 Early satiety: Secondary | ICD-10-CM | POA: Insufficient documentation

## 2020-03-08 DIAGNOSIS — K219 Gastro-esophageal reflux disease without esophagitis: Secondary | ICD-10-CM | POA: Insufficient documentation

## 2020-03-08 DIAGNOSIS — K2210 Ulcer of esophagus without bleeding: Secondary | ICD-10-CM | POA: Diagnosis not present

## 2020-03-08 DIAGNOSIS — M1990 Unspecified osteoarthritis, unspecified site: Secondary | ICD-10-CM | POA: Diagnosis not present

## 2020-03-08 DIAGNOSIS — F319 Bipolar disorder, unspecified: Secondary | ICD-10-CM | POA: Diagnosis not present

## 2020-03-08 DIAGNOSIS — R1310 Dysphagia, unspecified: Secondary | ICD-10-CM | POA: Insufficient documentation

## 2020-03-08 DIAGNOSIS — K644 Residual hemorrhoidal skin tags: Secondary | ICD-10-CM | POA: Diagnosis not present

## 2020-03-08 DIAGNOSIS — Z800 Family history of malignant neoplasm of digestive organs: Secondary | ICD-10-CM | POA: Diagnosis not present

## 2020-03-08 DIAGNOSIS — Z1211 Encounter for screening for malignant neoplasm of colon: Secondary | ICD-10-CM | POA: Diagnosis not present

## 2020-03-08 DIAGNOSIS — K449 Diaphragmatic hernia without obstruction or gangrene: Secondary | ICD-10-CM | POA: Diagnosis not present

## 2020-03-08 DIAGNOSIS — Z79899 Other long term (current) drug therapy: Secondary | ICD-10-CM | POA: Insufficient documentation

## 2020-03-08 HISTORY — PX: COLONOSCOPY WITH PROPOFOL: SHX5780

## 2020-03-08 HISTORY — PX: ESOPHAGOGASTRODUODENOSCOPY (EGD) WITH PROPOFOL: SHX5813

## 2020-03-08 HISTORY — PX: BIOPSY: SHX5522

## 2020-03-08 SURGERY — COLONOSCOPY WITH PROPOFOL
Anesthesia: Monitor Anesthesia Care

## 2020-03-08 MED ORDER — PROPOFOL 500 MG/50ML IV EMUL
INTRAVENOUS | Status: DC | PRN
  Administered 2020-03-08: 125 ug/kg/min via INTRAVENOUS

## 2020-03-08 MED ORDER — MIDAZOLAM HCL 5 MG/5ML IJ SOLN
INTRAMUSCULAR | Status: DC | PRN
Start: 2020-03-08 — End: 2020-03-08
  Administered 2020-03-08: 1 mg via INTRAVENOUS

## 2020-03-08 MED ORDER — PANTOPRAZOLE SODIUM 40 MG PO TBEC
40.0000 mg | DELAYED_RELEASE_TABLET | Freq: Every day | ORAL | 11 refills | Status: DC
Start: 2020-03-08 — End: 2021-12-25

## 2020-03-08 MED ORDER — PROPOFOL 10 MG/ML IV BOLUS
INTRAVENOUS | Status: DC | PRN
  Administered 2020-03-08: 20 mg via INTRAVENOUS

## 2020-03-08 MED ORDER — PROPOFOL 500 MG/50ML IV EMUL
INTRAVENOUS | Status: AC
  Filled 2020-03-08: qty 50

## 2020-03-08 MED ORDER — PROPOFOL 10 MG/ML IV BOLUS
INTRAVENOUS | Status: AC
  Filled 2020-03-08: qty 40

## 2020-03-08 MED ORDER — ONDANSETRON HCL 4 MG/2ML IJ SOLN
INTRAMUSCULAR | Status: DC | PRN
  Administered 2020-03-08: 4 mg via INTRAVENOUS

## 2020-03-08 MED ORDER — LACTATED RINGERS IV SOLN
INTRAVENOUS | Status: DC
  Administered 2020-03-08: 1000 mL via INTRAVENOUS

## 2020-03-08 MED ORDER — LIDOCAINE HCL (CARDIAC) PF 100 MG/5ML IV SOSY
PREFILLED_SYRINGE | INTRAVENOUS | Status: DC | PRN
  Administered 2020-03-08: 100 mg via INTRATRACHEAL

## 2020-03-08 MED ORDER — SODIUM CHLORIDE 0.9 % IV SOLN: INTRAVENOUS | Status: DC

## 2020-03-08 SURGICAL SUPPLY — 24 items

## 2020-03-08 NOTE — Transfer of Care (Signed)
Immediate Anesthesia Transfer of Care Note  [name]  Procedure(s) Performed: COLONOSCOPY WITH PROPOFOL (N/A ) ESOPHAGOGASTRODUODENOSCOPY (EGD) WITH PROPOFOL (N/A ) BIOPSY  Patient Location: PACU  Anesthesia Type:MAC  Level of Consciousness: sedated  Airway & Oxygen Therapy: Patient Spontanous Breathing and Patient connected to face mask oxygen  Post-op Assessment: Report given to RN and Post -op Vital signs reviewed and stable  Post vital signs: Reviewed and stable  Last Vitals:  Vitals Value Taken Time  BP 111/39 03/08/20 1147  Temp 36.2 C 03/08/20 1147  Pulse 87 03/08/20 1148  Resp 23 03/08/20 1149  SpO2 100 % 03/08/20 1148  Vitals shown include unvalidated device data.  Last Pain:  Vitals:   03/08/20 1147  TempSrc: Temporal  PainSc: 0-No pain         Complications: No apparent anesthesia complications

## 2020-03-08 NOTE — Op Note (Signed)
[facility] Patient Name: Danielle [name] Procedure Date: 03/08/2020 MRN: [id number] Attending MD: [name] , MD Date of Birth: 1971-09-08 CSN: [id number] Age: 49 Admit Type: Outpatient Procedure:                Colonoscopy Indications:              Screening patient at increased risk: Family history                            of 1st-degree relative with colorectal cancer at                            age 74 years (or older), This is the patient's                            first colonoscopy Providers:                [name], MD, Cleda Daub, RN, Marguerita Merles,                            Technician Referring MD:             [facility] Sagardia,MD Medicines:                Monitored Anesthesia Care Complications:            No immediate complications. Estimated Blood Loss:     Estimated blood loss: none. Procedure:                Pre-Anesthesia Assessment:                           - Prior to the procedure, a History and Physical                            was performed, and patient medications and                            allergies were reviewed. The patient's tolerance of                            previous anesthesia was also reviewed. The risks                            and benefits of the procedure and the sedation                            options and risks were discussed with the patient.                            All questions were answered, and informed consent                            was obtained. Prior Anticoagulants: The patient has  taken no previous anticoagulant or antiplatelet                            agents. ASA Grade Assessment: III - A patient with                            severe systemic disease. After reviewing the risks                            and benefits, the patient was deemed in                            satisfactory condition to undergo the procedure.                           - Prior to  the procedure, a History and Physical                            was performed, and patient medications and                            allergies were reviewed. The patient's tolerance of                            previous anesthesia was also reviewed. The risks                            and benefits of the procedure and the sedation                            options and risks were discussed with the patient.                            All questions were answered, and informed consent                            was obtained. Prior Anticoagulants: The patient has                            taken no previous anticoagulant or antiplatelet                            agents. ASA Grade Assessment: III - A patient with                            severe systemic disease. After reviewing the risks                            and benefits, the patient was deemed in                            satisfactory condition to undergo the procedure.  After obtaining informed consent, the colonoscope                            was passed under direct vision. Throughout the                            procedure, the patient's blood pressure, pulse, and                            oxygen saturations were monitored continuously. The                            [id number]-[id number] Olympus pediatric colonscope                            was introduced through the anus and advanced to the                            the terminal ileum. The colonoscopy was performed                            without difficulty. The patient tolerated the                            procedure well. The quality of the bowel                            preparation was good. Scope In: 11:30:43 AM Scope Out: 11:41:24 AM Scope Withdrawal Time: 0 hours 7 minutes 52 seconds  Total Procedure Duration: 0 hours 10 minutes 41 seconds  Findings:      Skin tags were found on perianal exam.      The colon (entire examined  portion) appeared normal.      The terminal ileum appeared normal.      Non-bleeding internal hemorrhoids were found during retroflexion. The       hemorrhoids were small. Impression:               - Perianal skin tags found on perianal exam.                           - The entire examined colon is normal.                           - The examined portion of the ileum was normal.                           - Non-bleeding internal hemorrhoids.                           - No specimens collected. Moderate Sedation:      Patient did not receive moderate sedation for this procedure, but       instead received monitored anesthesia care. Recommendation:           - Patient has a contact number available for  emergencies. The signs and symptoms of potential                            delayed complications were discussed with the                            patient. Return to normal activities tomorrow.                            Written discharge instructions were provided to the                            patient.                           - Resume regular diet.                           - Continue present medications.                           - Repeat colonoscopy in 5 years for screening                            purposes. Procedure Code(s):        --- Professional ---                           [id number], Colorectal cancer screening; colonoscopy on                            individual at high risk Diagnosis Code(s):        --- Professional ---                           Z80.0, Family history of malignant neoplasm of                            digestive organs                           K64.8, Other hemorrhoids                           K64.4, Residual hemorrhoidal skin tags CPT copyright 2019 [employer]. All rights reserved. The codes documented in this report are preliminary and upon coder review may  be revised to meet current compliance  requirements. [name], MD 03/08/2020 11:49:17 AM This report has been signed electronically. Number of Addenda: 0

## 2020-03-08 NOTE — Discharge Instructions (Signed)

## 2020-03-08 NOTE — Interval H&P Note (Signed)
History and Physical Interval Note: 48/female with dysphagia and family history of colon cancer in first degree relative for an EGD with possible balloon dilation and a high risk screening colonoscopy.  03/08/2020 10:07 AM  Danielle [name]  has presented today for Egd and colonoscopy, with the diagnosis of Family history of colon cancer in father, Epigastric pain, Esophageal dysphagia.  The various methods of treatment have been discussed with the patient and family. After consideration of risks, benefits and other options for treatment, the patient has consented to  Procedure(s): COLONOSCOPY WITH PROPOFOL (N/A) ESOPHAGOGASTRODUODENOSCOPY (EGD) WITH PROPOFOL (N/A) BALLOON DILATION (N/A) as a surgical intervention.  The patient's history has been reviewed, patient examined, no change in status, stable for surgery.  I have reviewed the patient's chart and labs.  Questions were answered to the patient's satisfaction.     Danielle [name]

## 2020-03-08 NOTE — Op Note (Signed)
[facility] Patient Name: Danielle [name] Procedure Date: 03/08/2020 MRN: [id number] Attending MD: [name] , MD Date of Birth: 1971/01/18 CSN: [id number] Age: 49 Admit Type: Outpatient Procedure:                Upper GI endoscopy Indications:              Dysphagia Providers:                [name], MD, Cleda Daub, RN, Marguerita Merles,                            Technician Referring MD:             [facility] Sagardia,MD Medicines:                Monitored Anesthesia Care Complications:            No immediate complications. Estimated blood loss:                            Minimal. Estimated Blood Loss:     Estimated blood loss was minimal. Procedure:                Pre-Anesthesia Assessment:                           - Prior to the procedure, a History and Physical                            was performed, and patient medications and                            allergies were reviewed. The patient's tolerance of                            previous anesthesia was also reviewed. The risks                            and benefits of the procedure and the sedation                            options and risks were discussed with the patient.                            All questions were answered, and informed consent                            was obtained. Prior Anticoagulants: The patient has                            taken no previous anticoagulant or antiplatelet                            agents. ASA Grade Assessment: III - A patient with                            severe systemic  disease. After reviewing the risks                            and benefits, the patient was deemed in                            satisfactory condition to undergo the procedure.                           After obtaining informed consent, the endoscope was                            passed under direct vision. Throughout the                            procedure, the patient's  blood pressure, pulse, and                            oxygen saturations were monitored continuously. The                            GIF-H190 [id number]) was introduced through the                            mouth, and advanced to the second part of duodenum.                            The upper GI endoscopy was accomplished without                            difficulty. The patient tolerated the procedure                            well. Scope In: Scope Out: Findings:      One superficial esophageal ulcer with no bleeding and no stigmata of       recent bleeding was found 33 cm from the incisors. The lesion was 5 mm       in largest dimension. Biopsies were taken with a cold forceps for       histology.      A 7 cm hiatal hernia was present.      No endoscopic abnormality was evident in the esophagus to explain the       patient's complaint of dysphagia. Biopsies were obtained from the       proximal and distal esophagus with cold forceps for histology of       suspected eosinophilic esophagitis.      Localized moderately erythematous mucosa without bleeding was found in       the gastric antrum. Biopsies were taken with a cold forceps for       Helicobacter pylori testing.      The cardia and gastric fundus were otherwise normal on retroflexion.      The examined duodenum was normal. Impression:               - Esophageal ulcer with no bleeding and no stigmata  of recent bleeding. Biopsied.                           - 7 cm hiatal hernia.                           - No endoscopic esophageal abnormality to explain                            patient's dysphagia. Biopsied.                           - Erythematous mucosa in the antrum. Biopsied.                           - Normal examined duodenum. Moderate Sedation:      Patient did not receive moderate sedation for this procedure, but       instead received monitored anesthesia care. Recommendation:            - Patient has a contact number available for                            emergencies. The signs and symptoms of potential                            delayed complications were discussed with the                            patient. Return to normal activities tomorrow.                            Written discharge instructions were provided to the                            patient.                           - Resume regular diet.                           - Continue present medications.                           - Await pathology results. Procedure Code(s):        --- Professional ---                           [phone], Esophagogastroduodenoscopy, flexible,                            transoral; with biopsy, single or multiple Diagnosis Code(s):        --- Professional ---                           K22.10, Ulcer of esophagus without bleeding  K44.9, Diaphragmatic hernia without obstruction or                            gangrene                           R13.10, Dysphagia, unspecified                           K31.89, Other diseases of stomach and duodenum CPT copyright 2019 American Medical Association. All rights reserved. The codes documented in this report are preliminary and upon coder review may  be revised to meet current compliance requirements. [name], MD 03/08/2020 11:47:01 AM This report has been signed electronically. Number of Addenda: 0

## 2020-03-08 NOTE — Anesthesia Preprocedure Evaluation (Addendum)
Anesthesia Evaluation  Patient identified by MRN, date of birth, ID band Patient awake    Reviewed: Allergy & Precautions, NPO status , Patient's Chart, lab work & pertinent test results  History of Anesthesia Complications Negative for: history of anesthetic complications  Airway Mallampati: II  TM Distance: >3 FB Neck ROM: Full    Dental  (+) Dental Advisory Given   Pulmonary sleep apnea (CPAP not needed) ,  03/05/2020 SARS coronavirus NEG   breath sounds clear to auscultation       Cardiovascular hypertension, Pt. on medications (-) angina Rhythm:Regular Rate:Normal  01/2020 ECHO: EF 60-65%, valves OK   Neuro/Psych  Headaches, Anxiety Depression Bipolar Disorder    GI/Hepatic Neg liver ROS, GERD  Medicated and Poorly Controlled,  Endo/Other  Morbid obesity  Renal/GU negative Renal ROS     Musculoskeletal  (+) Arthritis ,   Abdominal (+) + obese,   Peds  Hematology negative hematology ROS (+)   Anesthesia Other Findings   Reproductive/Obstetrics                            Anesthesia Physical Anesthesia Plan  ASA: III  Anesthesia Plan: MAC   Post-op Pain Management:    Induction:   PONV Risk Score and Plan: 2 and Treatment may vary due to age or medical condition  Airway Management Planned: Natural Airway and Simple Face Mask  Additional Equipment: None  Intra-op Plan:   Post-operative Plan:   Informed Consent: I have reviewed the patients History and Physical, chart, labs and discussed the procedure including the risks, benefits and alternatives for the proposed anesthesia with the patient or authorized representative who has indicated his/her understanding and acceptance.     Dental advisory given  Plan Discussed with: CRNA and Surgeon  Anesthesia Plan Comments:        Anesthesia Quick Evaluation

## 2020-03-08 NOTE — Anesthesia Postprocedure Evaluation (Signed)
Anesthesia Post Note  Patient: Janae A Colvin  Procedure(s) Performed: COLONOSCOPY WITH PROPOFOL (N/A ) ESOPHAGOGASTRODUODENOSCOPY (EGD) WITH PROPOFOL (N/A ) BIOPSY     Patient location during evaluation: Endoscopy Anesthesia Type: MAC Level of consciousness: awake and alert, oriented and patient cooperative Pain management: pain level controlled Vital Signs Assessment: post-procedure vital signs reviewed and stable Respiratory status: spontaneous breathing, nonlabored ventilation and respiratory function stable Cardiovascular status: blood pressure returned to baseline and stable Postop Assessment: no apparent nausea or vomiting Anesthetic complications: no    Last Vitals:  Vitals:   03/08/20 1200 03/08/20 1210  BP: (!) 92/58 (!) 142/87  Pulse:  71  Resp: (!) 21 17  Temp:    SpO2: 95% 100%    Last Pain:  Vitals:   03/08/20 1210  TempSrc:   PainSc: 0-No pain                 Lyrick Lagrand,E. Arden Axon

## 2020-03-08 NOTE — Interval H&P Note (Signed)
History and Physical Interval Note: 48/female for a screening colonoscopy,her father had colon cancer at 70..  03/08/2020 10:03 AM  Danielle [name]  has presented today for colonoscopy with the diagnosis of Family history of colon cancer in father, Epigastric pain, Esophageal dysphagia.  The various methods of treatment have been discussed with the patient and family. After consideration of risks, benefits and other options for treatment, the patient has consented to  Procedure(s): COLONOSCOPY WITH PROPOFOL (N/A) ESOPHAGOGASTRODUODENOSCOPY (EGD) WITH PROPOFOL (N/A) BALLOON DILATION (N/A) as a surgical intervention.  The patient's history has been reviewed, patient examined, no change in status, stable for surgery.  I have reviewed the patient's chart and labs.  Questions were answered to the patient's satisfaction.     [name]

## 2020-03-08 NOTE — Brief Op Note (Signed)
03/08/2020  11:50 AM  PATIENT:  Danielle [name]  49 y.o. female  PRE-OPERATIVE DIAGNOSIS:  Family history of colon cancer in father, Epigastric pain, Esophageal dysphagia  POST-OPERATIVE DIAGNOSIS:  EGD: hiatal hernia, esophageal ulcer, biopsy Colon: hemorrhoids  PROCEDURE:  Procedure(s): COLONOSCOPY WITH PROPOFOL (N/A) ESOPHAGOGASTRODUODENOSCOPY (EGD) WITH PROPOFOL (N/A) BIOPSY  SURGEON:  Surgeon(s) and Role:    [name], MD - Primary  PHYSICIAN ASSISTANT:   ASSISTANTS: Lilli Few, Marguerita Merles, Tech   ANESTHESIA:   MAC  EBL:  Minimal  BLOOD ADMINISTERED:none  DRAINS: none   LOCAL MEDICATIONS USED:  NONE  SPECIMEN:  Biopsy / Limited Resection  DISPOSITION OF SPECIMEN:  PATHOLOGY  COUNTS:  YES  TOURNIQUET:  * No tourniquets in log *  DICTATION: .Dragon Dictation  PLAN OF CARE: Discharge to home after PACU  PATIENT DISPOSITION:  PACU - hemodynamically stable.   Delay start of Pharmacological VTE agent (>24hrs) due to surgical blood loss or risk of bleeding: no

## 2020-03-11 ENCOUNTER — Other Ambulatory Visit: Payer: Self-pay

## 2020-03-11 ENCOUNTER — Encounter: Payer: Self-pay | Admitting: *Deleted

## 2020-03-12 ENCOUNTER — Telehealth: Payer: Self-pay

## 2020-03-12 LAB — SURGICAL PATHOLOGY

## 2020-03-12 NOTE — Telephone Encounter (Signed)
Patient called concerning an injection in her knee and would like to discuss about being referred to a provider for weight loss surgery.  Cb# [id number].  Please advise.  Thank you.

## 2020-03-13 NOTE — Telephone Encounter (Signed)
Yes Dr. Leafy Ro

## 2020-03-15 NOTE — Telephone Encounter (Signed)
Order is in already from last time.   Called patient to see what she needs. No answer. LMOM

## 2020-05-06 ENCOUNTER — Ambulatory Visit: Payer: Medicare Other | Admitting: Physician Assistant

## 2020-05-07 ENCOUNTER — Ambulatory Visit (INDEPENDENT_AMBULATORY_CARE_PROVIDER_SITE_OTHER): Payer: Medicare Other | Admitting: Orthopaedic Surgery

## 2020-05-07 ENCOUNTER — Encounter: Payer: Self-pay | Admitting: Physician Assistant

## 2020-05-07 VITALS — Ht 65.0 in | Wt 359.6 lb

## 2020-05-07 DIAGNOSIS — R634 Abnormal weight loss: Secondary | ICD-10-CM | POA: Diagnosis not present

## 2020-05-07 DIAGNOSIS — M1712 Unilateral primary osteoarthritis, left knee: Secondary | ICD-10-CM

## 2020-05-07 MED ORDER — TRAMADOL HCL 50 MG PO TABS: 50.0000 mg | ORAL_TABLET | Freq: Three times a day (TID) | ORAL | 0 refills | Status: DC | PRN

## 2020-05-07 NOTE — Progress Notes (Signed)
Office Visit Note   [name]           Date of Birth: [birth year]           MRN: [id number] Visit Date: 05/07/2020              Requested by: Horald Pollen, MD [place],  [place] [address] PCP: Horald Pollen, MD   Assessment & Plan: Visit Diagnoses:  1. Unilateral primary osteoarthritis, left knee   2. Weight loss     Plan: Impression is left knee degenerative joint disease.  The patient has not had great relief from cortisone injections at this point, we will try and get approval from insurance for viscosupplementation injection.  In the meantime, we will refer her to a weight loss clinic as she is interested in bariatric surgery.  She will need to lose weight to get down to 230 pounds before we are able to safely proceed with left knee replacement.  Follow-up with [place] as needed.  This patient is diagnosed with osteoarthritis of the knee(s).    Radiographs show evidence of joint space narrowing, osteophytes, subchondral sclerosis and/or subchondral cysts.  This patient has knee pain which interferes with functional and activities of daily living.    This patient has experienced inadequate response, adverse effects and/or intolerance with conservative treatments such as acetaminophen, NSAIDS, topical creams, physical therapy or regular exercise, knee bracing and/or weight loss.   This patient has experienced inadequate response or has a contraindication to intra articular steroid injections for at least 3 months.   This patient is not scheduled to have a total knee replacement within 6 months of starting treatment with viscosupplementation.   Follow-Up Instructions: Return if symptoms worsen or fail to improve.   Orders:  Orders Placed This Encounter  Procedures  . Amb Referral to Bariatric Surgery   Meds ordered this encounter  Medications  . traMADol (ULTRAM) 50 MG tablet    Sig: Take 1 tablet (50 mg total) by mouth 3 (three)  times daily as needed.    Dispense:  30 tablet    Refill:  0      Procedures: No procedures performed   Clinical Data: No additional findings.   Subjective: Chief Complaint  Patient presents with  . Left Knee - Pain    HPI patient is a pleasant 49 year old female who comes in today with recurrent left knee pain.  History of advanced degenerative changes.  She describes her left knee pain as a constant ache worse with standing.  She has pain at night as well.  She has tried Tylenol and Advil without relief of symptoms.  She previously had a cortisone injection by Dr. Junius Roads under ultrasound [place] months back which she notes did not help very much.  She is trying to lose weight to be able to have knee replacement surgery but is having a hard time.  Review of Systems as detailed in HPI.  All others reviewed and are negative.   Objective: Vital Signs: Ht 5\' 5"  (1.651 m)   Wt (!) 359 lb 9.6 oz (163.1 kg)   BMI 59.84 kg/m   Physical Exam well-developed well-nourished female no acute distress.  Alert oriented x3.  Ortho Exam examination of the left knee shows range of motion from 0 to 90 degrees.  Lateral joint line tenderness.  Moderate patellofemoral crepitus.  She is neurovascular intact distally.  Specialty Comments:  No specialty comments available.  Imaging: No new imaging  PMFS History: Patient Active Problem List   Diagnosis Date Noted  . Closed nondisplaced fracture of neck of fifth metacarpal bone of right hand with routine healing 12/13/2015  . HYPERTENSION 11/12/2010  . OBESITY 11/12/2010  . COCAINE DEPENDENCE, IN REMISSION 11/12/2010  . DEPRESSION 11/12/2010  . GERD 11/12/2010  . BACK PAIN 11/12/2010   Past Medical History:  Diagnosis Date  . Arthritis   . Bipolar 1 disorder ([place])   . 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 GERD (gastroesophageal reflux disease)   . Hypertension   . Migraines   . Sleep apnea    states had  test-no cpap ordered-does snore    History reviewed. No pertinent family history.  Past Surgical History:  Procedure Laterality Date  . BIOPSY  03/08/2020   Procedure: BIOPSY;  Surgeon: [name], MD;  Location: [facility];  Service: Gastroenterology;;  . COLONOSCOPY WITH PROPOFOL N/A 03/08/2020   Procedure: COLONOSCOPY WITH PROPOFOL;  Surgeon: [name], MD;  Location: [facility];  Service: Gastroenterology;  Laterality: N/A;  . ESOPHAGOGASTRODUODENOSCOPY (EGD) WITH PROPOFOL N/A 03/08/2020   Procedure: ESOPHAGOGASTRODUODENOSCOPY (EGD) WITH PROPOFOL;  Surgeon: [name], MD;  Location: [facility];  Service: Gastroenterology;  Laterality: N/A;  . FRACTURE SURGERY     rt femer/knee-plate-screws-  . ORIF ANKLE FRACTURE  03/16/2012   Procedure: OPEN REDUCTION INTERNAL FIXATION (ORIF) ANKLE FRACTURE;  Surgeon: Colin Rhein, MD;  Location: [facility];  Service: Orthopedics;  Laterality: Right;  right lateral malleolus fracture   Social History   Occupational History  . Not on file  Tobacco Use  . Smoking status: Never Smoker  . Smokeless tobacco: Never Used  Vaping Use  . Vaping Use: Never used  Substance and Sexual Activity  . Alcohol use: No    Comment: occ  . Drug use: Yes    Types: "Crack" cocaine, Cocaine    Comment: remission 11/12  . Sexual activity: Not on file

## 2020-05-09 ENCOUNTER — Telehealth: Payer: Self-pay

## 2020-05-09 NOTE — Telephone Encounter (Signed)
Please submit for left knee gel inj- Dr. Erlinda Hong.

## 2020-05-09 NOTE — Telephone Encounter (Signed)
Noted  

## 2020-05-17 ENCOUNTER — Telehealth: Payer: Self-pay

## 2020-05-17 NOTE — Telephone Encounter (Signed)
Submitted VOB, Durolane, left knee.  

## 2020-05-20 ENCOUNTER — Telehealth: Payer: Self-pay

## 2020-05-20 NOTE — Telephone Encounter (Signed)
Tried calling pt to discuss, no answer and no voice mail

## 2020-05-20 NOTE — Telephone Encounter (Signed)
Approved for Durolane-Left knee Dr. Frederik Pear and Bill Covered @ 100% No prior auth required

## 2020-05-21 ENCOUNTER — Ambulatory Visit: Payer: Medicare Other | Admitting: Podiatry

## 2020-05-21 NOTE — Telephone Encounter (Signed)
Tried again....Marland Kitchenstill no answer and no voice mail

## 2020-05-23 NOTE — Telephone Encounter (Signed)
Tried pt again. Unable to reach pt to schedule

## 2020-05-23 NOTE — Telephone Encounter (Signed)
FYI

## 2020-07-16 ENCOUNTER — Telehealth: Payer: Self-pay | Admitting: Orthopaedic Surgery

## 2020-07-16 NOTE — Telephone Encounter (Signed)
Patient stopped by  She wanted to know when she would be hearing back about the weight loss plan discussed at her last appointment.  Call back:  [phone]

## 2020-07-22 NOTE — Telephone Encounter (Signed)
Tried calling pt back numerous times, keeps ringing, no vm

## 2020-07-23 NOTE — Telephone Encounter (Signed)
Called pt again this morning, no vm. Will try again later.

## 2020-07-27 ENCOUNTER — Other Ambulatory Visit: Payer: Self-pay | Admitting: Physician Assistant

## 2020-08-09 ENCOUNTER — Encounter: Payer: Self-pay | Admitting: Pulmonary Disease

## 2020-08-14 NOTE — Progress Notes (Signed)
New Patient Note  RE: [name] MRN: [id number] DOB: 1970/11/01 Date of Office Visit: 08/15/2020  Referring provider: Trey Sailors, PA Primary care provider: Trey Sailors, [place]  Chief Complaint: Pruritus and Rash  History of Present Illness: I had the pleasure of seeing Danielle [name] for initial evaluation at the [facility] [place] on 08/15/2020. Danielle [name] is a 49 y.o. female, who is referred here by Trey Sailors, PA for the evaluation of pruritic rash.  Itching for many years but worse for the past few years. Mainly occurs on her back and abdominal area. Describes them as very itchy areas and not sure if there's an associated rash with this. Suspected triggers are unknown - possibly chocolate makes it worse, gain laundry detergent makes it worse. Denies any changes in medications, foods, personal care products. Danielle [name] has tried the following therapies: benadryl and hydroxyzine with some benefit. Systemic steroids no.  Previous work up includes: none. Previous history of rash/hives: no. Patient is up to date with the following cancer screening tests: patient is not up to date.   Currently using some type of "alevel" body wash and coco butter and Vaseline as moisturizer but not daily.   Assessment and Plan: Danielle [name] is a 49 y.o. female with: Pruritus Main complaint of pruritus which has been worsening the last few years.  Patient is unsure if there is an associated rash at times.  This occurs on her back and abdominal area.  Tried Benadryl and hydroxyzine with some benefit.  No previous work-up for this.  Patient does not moisturize daily.  Sometimes worsens after chocolate ingestion.  Today's skin testing showed: Negative to indoor/outdoor allergens, common foods and chocolate. The positive control was borderline positive questioning the validity of results. Will double check with bloodwork.  See below for proper skin care - make sure you use fragrance  free and dye free products.   The most common cause of itching is usually dry skin - make sure to moisturize daily.   Start taking cetirizine 10mg  1-2 times a day as needed for the itching.   If it makes you too drowsy let us know.  Stop the hydroxyzine for now.  Get bloodwork to rule out other etiologies.   Chronic rhinitis Mild rhinitis symptoms.  Today's skin testing was negative to environmental allergies but her positive control had a borderline reactivity.  We will double check with blood work.  Monitor symptoms.   Will make additional recommendations once the bloodwork is back.  Asthma, not well controlled Diagnosed with asthma many years ago.  Currently on Symbicort 160 mcg 2 puffs twice a day and albuterol as needed with good benefit.  Patient mistakenly stopped her inhalers for today's visit. Today's spirometry showed some restriction with 12% improvement in FEV1 post bronchodilator treatment.  Clinically feeling improved. . Daily controller medication(s): continue Symbicort 114mcg 2 puffs twice a day and rinse mouth after each use.  . May use albuterol rescue inhaler 2 puffs every 4 to 6 hours as needed for shortness of breath, chest tightness, coughing, and wheezing. May use albuterol rescue inhaler 2 puffs 5 to 15 minutes prior to strenuous physical activities. Monitor frequency of use.  . Repeat spirometry at next visit.  Return in about 3 months (around 11/15/2020).  Lab Orders     CBC with Differential/Platelet     Comprehensive metabolic panel     ANA w/Reflex     Alpha-Gal Panel     Tryptase  Thyroid Cascade Profile     Allergens w/Total IgE Area 2     Chocolate IgE  Other allergy screening: Asthma: yes  Danielle [name] reports symptoms of chest tightness, shortness of breath, coughing, wheezing for many years. Current medications include Symbicort 1100mcg 2 puffs twice a day and albuterol prn which help. Main triggers are unknown. Asthma was diagnosed at age unknown  History of pneumonia: as a child. Danielle [name] was not evaluated by allergist/pulmonologist in the past. Smoking exposure: used to smoke crack cocaine. Up to date with flu vaccine: yes. Up to date with COVID-19 vaccine: yes.  History of reflux: yes and takes pantoprazole for this.   Rhino conjunctivitis: yes  Rhinitis symptoms and takes some kind of medications with some benefit.  Food allergy: no  Avoiding chocolate.   Medication allergy: percocet - causes nausea/vomiting. Hymenoptera allergy: no Urticaria: no Eczema:no History of recurrent infections suggestive of immunodeficency: no  Diagnostics: Spirometry:  Tracings reviewed. Her effort: Good reproducible efforts. FVC: 1.57L FEV1: 1.37L, 57% predicted FEV1/FVC ratio: 87% Interpretation: Spirometry consistent with possible restrictive disease with 12% improvement in FEV1 post bronchodilator treatment.  Clinically feeling better. Please see scanned spirometry results for details.  Skin Testing: Environmental allergy panel and select foods. Negative to indoor/outdoor allergens, common foods and chocolate. The positive control was borderline positive questioning the validity of results.  Results discussed with patient/family.  Airborne Adult Perc - 08/15/20 1023    Time Antigen Placed 1023    Allergen Manufacturer Lavella Hammock    Location Back    Number of Test 59    Panel 1 Select    1. Control-Buffer 50% Glycerol Negative    2. Control-Histamine 1 mg/ml --   +/-   3. Albumin saline Negative    4. Mendon Negative    5. [place] Negative    6. Johnson Negative    7. Benedict Blue Negative    8. Meadow Fescue Negative    9. Perennial Rye Negative    10. Sweet Vernal Negative    11. Timothy Negative    12. Cocklebur Negative    13. Burweed Marshelder Negative    14. Ragweed, short Negative    15. Ragweed, Giant Negative    16. Plantain,  English Negative    17. Lamb's Quarters Negative    18. Sheep Sorrell Negative    19. Rough  Pigweed Negative    20. Marsh Elder, Rough Negative    21. Mugwort, Common Negative    22. Ash mix Negative    23. Birch mix Negative    24. Beech American Negative    25. Box, Elder Negative    26. Cedar, red Negative    27. Cottonwood, [place] Negative    28. Elm mix Negative    29. Hickory Negative    30. Maple mix Negative    31. Oak, [place] mix Negative    32. Pecan Pollen Negative    33. Pine mix Negative    34. Sycamore Eastern Negative    35. Butler, Black Pollen Negative    36. Alternaria alternata Negative    37. Cladosporium Herbarum Negative    38. Aspergillus mix Negative    39. Penicillium mix Negative    40. Bipolaris sorokiniana (Helminthosporium) Negative    41. Drechslera spicifera (Curvularia) Negative    42. Mucor plumbeus Negative    43. Fusarium moniliforme Negative    44. Aureobasidium pullulans (pullulara) Negative    45. Rhizopus oryzae Negative    46. Botrytis  cinera Negative    47. Epicoccum nigrum Negative    48. Phoma betae Negative    49. Candida Albicans Negative    50. Trichophyton mentagrophytes Negative    51. Mite, D Farinae  5,000 AU/ml Negative    52. Mite, D Pteronyssinus  5,000 AU/ml Negative    53. Cat Hair 10,000 BAU/ml Negative    54.  Dog Epithelia Negative    55. Mixed Feathers Negative    56. Horse Epithelia Negative    57. Cockroach, German Negative    58. Mouse Negative    59. Tobacco Leaf Negative          Food Adult Perc - 08/15/20 1000    Time Antigen Placed 1023    Allergen Manufacturer Lavella Hammock    Location Back    Number of allergen test 11    Panel 2 Select    Control-Histamine 1 mg/ml --   +/-   2. Soybean Negative    3. Wheat Negative    4. Sesame Negative    5. Milk, cow Negative    6. Egg White, Chicken Negative    7. Casein Negative    8. Shellfish Mix Negative    9. Fish Mix Negative    10. Cashew Negative    64. Chocolate/Cacao bean Negative    Comments n           Past Medical  History: Patient Active Problem List   Diagnosis Date Noted  . Pruritus 08/15/2020  . Asthma, not well controlled 08/15/2020  . Chronic rhinitis 08/15/2020  . Heartburn 08/15/2020  . Closed nondisplaced fracture of neck of fifth metacarpal bone of right hand with routine healing 12/13/2015  . HYPERTENSION 11/12/2010  . OBESITY 11/12/2010  . COCAINE DEPENDENCE, IN REMISSION 11/12/2010  . DEPRESSION 11/12/2010  . GERD 11/12/2010  . BACK PAIN 11/12/2010   Past Medical History:  Diagnosis Date  . Arthritis   . Asthma   . Bipolar 1 disorder ([place])   . 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 GERD (gastroesophageal reflux disease)   . Hypertension   . Migraines   . Sleep apnea    states had test-no cpap ordered-does snore   Past Surgical History: Past Surgical History:  Procedure Laterality Date  . BIOPSY  03/08/2020   Procedure: BIOPSY;  Surgeon: [name], MD;  Location: [facility];  Service: Gastroenterology;;  . COLONOSCOPY WITH PROPOFOL N/A 03/08/2020   Procedure: COLONOSCOPY WITH PROPOFOL;  Surgeon: [name], MD;  Location: [facility];  Service: Gastroenterology;  Laterality: N/A;  . ESOPHAGOGASTRODUODENOSCOPY (EGD) WITH PROPOFOL N/A 03/08/2020   Procedure: ESOPHAGOGASTRODUODENOSCOPY (EGD) WITH PROPOFOL;  Surgeon: [name], MD;  [facility];  Service: Gastroenterology;  Laterality: N/A;  . FRACTURE SURGERY     rt femer/knee-plate-screws-  . ORIF ANKLE FRACTURE  03/16/2012   Procedure: OPEN REDUCTION INTERNAL FIXATION (ORIF) ANKLE FRACTURE;  Surgeon: Colin Rhein, MD;  Location: [facility];  Service: Orthopedics;  Laterality: Right;  right lateral malleolus fracture   Medication List:  Current Outpatient Medications  Medication Sig Dispense Refill  . acetaminophen (TYLENOL) 500 MG tablet Take 1,000 mg by mouth every 6 (six) hours as needed for mild pain or moderate pain.    . diphenhydrAMINE (BENADRYL) 25 MG tablet  Take 75 mg by mouth every 6 (six) hours as needed for itching.    . DULoxetine (CYMBALTA) 30 MG capsule Take 30 mg by mouth 2 (two)  times daily.    . famotidine (PEPCID) 20 MG tablet Take 1 tablet (20 mg total) by mouth 2 (two) times daily. (Patient taking differently: Take 20-120 mg by mouth daily as needed for heartburn. ) 30 tablet 0  . hydrochlorothiazide (HYDRODIURIL) 25 MG tablet Take 25 mg by mouth daily.    . hydrOXYzine (ATARAX/VISTARIL) 25 MG tablet Take 12.5 mg by mouth at bedtime.    Marland Kitchen omeprazole (PRILOSEC) 40 MG capsule Take 40 mg by mouth daily.    . pantoprazole (PROTONIX) 40 MG tablet Take 1 tablet (40 mg total) by mouth daily. 30 tablet 11  . promethazine-dextromethorphan (PROMETHAZINE-DM) 6.25-15 MG/5ML syrup Take 5 mLs by mouth every 6 (six) hours.    . sertraline (ZOLOFT) 50 MG tablet Take 50 mg by mouth at bedtime.    . SYMBICORT 160-4.5 MCG/ACT inhaler SMARTSIG:2 Puff(s) By Mouth Twice Daily    . traMADol (ULTRAM) 50 MG tablet Take 1 tablet (50 mg total) by mouth 3 (three) times daily as needed. 30 tablet 0  . WIXELA INHUB 250-50 MCG/DOSE AEPB 1 puff 2 (two) times daily.     No current facility-administered medications for this visit.   Allergies: Allergies  Allergen Reactions  . Percocet [Oxycodone-Acetaminophen] Nausea And Vomiting   Social History: Social History   Socioeconomic History  . Marital status: Married    Spouse name: Not on file  . Number of children: Not on file  . Years of education: Not on file  . Highest education level: Not on file  Occupational History  . Not on file  Tobacco Use  . Smoking status: Never Smoker  . Smokeless tobacco: Never Used  Vaping Use  . Vaping Use: Never used  Substance and Sexual Activity  . Alcohol use: No    Comment: occ  . Drug use: Yes    Types: "Crack" cocaine, Cocaine    Comment: remission 11/12  . Sexual activity: Not on file  Other Topics Concern  . Not on file  Social History Narrative  . Not on  file   Social Determinants of Health   Financial Resource Strain:   . Difficulty of Paying Living Expenses: Not on file  Food Insecurity:   . Worried About Charity fundraiser in the Last Year: Not on file  . Ran Out of Food in the Last Year: Not on file  Transportation Needs:   . Lack of Transportation (Medical): Not on file  . Lack of Transportation (Non-Medical): Not on file  Physical Activity:   . Days of Exercise per Week: Not on file  . Minutes of Exercise per Session: Not on file  Stress:   . Feeling of Stress : Not on file  Social Connections:   . Frequency of Communication with Friends and Family: Not on file  . Frequency of Social Gatherings with Friends and Family: Not on file  . Attends Religious Services: Not on file  . Active Member of Clubs or Organizations: Not on file  . Attends Archivist Meetings: Not on file  . Marital Status: Not on file   Lives in an old house. Smoking: denies Occupation: disabled  Environmental HistoryFreight forwarder in the house: no Carpet in the family room: no Carpet in the bedroom: no Heating: gas Cooling: central Pet: no  Family History: Family History  Problem Relation Age of Onset  . Cancer Father   . High blood pressure Father   . Asthma Sister   . Eczema Sister  Review of Systems  Constitutional: Negative for appetite change, chills, fever and unexpected weight change.  HENT: Negative for congestion and rhinorrhea.   Eyes: Negative for itching.  Respiratory: Positive for cough, chest tightness, shortness of breath and wheezing.   Cardiovascular: Negative for chest pain.  Gastrointestinal: Negative for abdominal pain.  Genitourinary: Negative for difficulty urinating.  Skin: Negative for rash.  Neurological: Negative for headaches.   Objective: BP 132/84   Pulse 88   Resp 19   Ht 5\' 5"  (1.651 m)   Wt (!) 357 lb 8 oz (162.2 kg)   SpO2 98%   BMI 59.49 kg/m  Body mass index is 59.49  kg/m. Physical Exam Vitals and nursing note reviewed.  Constitutional:      Appearance: Normal appearance. Danielle [name] is well-developed. Danielle [name] is obese.  HENT:     Head: Normocephalic and atraumatic.     Right Ear: Tympanic membrane and external ear normal.     Left Ear: Tympanic membrane and external ear normal.     Nose: Nose normal.     Mouth/Throat:     Mouth: Mucous membranes are moist.     Pharynx: Oropharynx is clear.  Eyes:     Conjunctiva/sclera: Conjunctivae normal.  Cardiovascular:     Rate and Rhythm: Normal rate and regular rhythm.     Heart sounds: Normal heart sounds. No murmur heard.  No friction rub. No gallop.   Pulmonary:     Effort: Pulmonary effort is normal.     Breath sounds: Normal breath sounds. No wheezing, rhonchi or rales.  Musculoskeletal:     Cervical back: Neck supple.  Skin:    General: Skin is warm.     Findings: No rash.  Neurological:     Mental Status: Danielle [name] is alert and oriented to person, place, and time.  Psychiatric:        Behavior: Behavior normal.    The plan was reviewed with the patient/family, and all questions/concerned were addressed.  It was my pleasure to see Shritha today and participate in her care. Please feel free to contact me with any questions or concerns.  Sincerely,  Rexene Alberts, DO Allergy & Immunology  [facility] office: [place] office: [phone]

## 2020-08-15 ENCOUNTER — Other Ambulatory Visit: Payer: Self-pay

## 2020-08-15 ENCOUNTER — Ambulatory Visit (INDEPENDENT_AMBULATORY_CARE_PROVIDER_SITE_OTHER): Payer: Medicare Other | Admitting: Allergy

## 2020-08-15 ENCOUNTER — Encounter: Payer: Self-pay | Admitting: Allergy

## 2020-08-15 VITALS — BP 132/84 | HR 88 | Resp 19 | Ht 65.0 in | Wt 357.5 lb

## 2020-08-15 DIAGNOSIS — J45909 Unspecified asthma, uncomplicated: Secondary | ICD-10-CM | POA: Diagnosis not present

## 2020-08-15 DIAGNOSIS — L299 Pruritus, unspecified: Secondary | ICD-10-CM | POA: Diagnosis not present

## 2020-08-15 DIAGNOSIS — J310 Chronic rhinitis: Secondary | ICD-10-CM | POA: Insufficient documentation

## 2020-08-15 DIAGNOSIS — R12 Heartburn: Secondary | ICD-10-CM | POA: Diagnosis not present

## 2020-08-15 NOTE — Assessment & Plan Note (Signed)
Main complaint of pruritus which has been worsening the last few years.  Patient is unsure if there is an associated rash at times.  This occurs on her back and abdominal area.  Tried Benadryl and hydroxyzine with some benefit.  No previous work-up for this.  Patient does not moisturize daily.  Sometimes worsens after chocolate ingestion.  Today's skin testing showed: Negative to indoor/outdoor allergens, common foods and chocolate. The positive control was borderline positive questioning the validity of results. Will double check with bloodwork.  See below for proper skin care - make sure you use fragrance free and dye free products.   The most common cause of itching is usually dry skin - make sure to moisturize daily.   Start taking cetirizine 10mg  1-2 times a day as needed for the itching.   If it makes you too drowsy let us know.  Stop the hydroxyzine for now.  Get bloodwork to rule out other etiologies.

## 2020-08-15 NOTE — Assessment & Plan Note (Signed)
Diagnosed with asthma many years ago.  Currently on Symbicort 160 mcg 2 puffs twice a day and albuterol as needed with good benefit.  Patient mistakenly stopped her inhalers for today's visit. Today's spirometry showed some restriction with 12% improvement in FEV1 post bronchodilator treatment.  Clinically feeling improved. . Daily controller medication(s): continue Symbicort 188mcg 2 puffs twice a day and rinse mouth after each use.  . May use albuterol rescue inhaler 2 puffs every 4 to 6 hours as needed for shortness of breath, chest tightness, coughing, and wheezing. May use albuterol rescue inhaler 2 puffs 5 to 15 minutes prior to strenuous physical activities. Monitor frequency of use.  . Repeat spirometry at next visit.

## 2020-08-15 NOTE — Patient Instructions (Addendum)
Today's skin testing showed: Negative to indoor/outdoor allergens, common foods and chocolate. The positive control was borderline positive questioning the validity of results. Will double check with boodwork.  Itching:  See below for proper skin care - make sure you use fragrance free and dye free products.   The most common cause of itching is usually dry skin - make sure you moisturize daily.   Start taking cetirizine 10mg  1-2 times a day as needed for the itching.   If it makes you too drowsy let us know.  Stop the hydroxyzine for now.  Get bloodwork to rule out other etiologies.  We are ordering labs, so please allow 1-2 weeks for the results to come back. With the newly implemented Cures Act, the labs might be visible to you at the same time that they become visible to me. However, I will not address the results until all of the results are back, so please be patient.   Rhinitis:   Monitor symptoms.   Will make additional recommendations once the bloodwork is back.  Asthma: . Daily controller medication(s): continue Symbicort 126mcg 2 puffs twice a day and rinse mouth after each use.  . May use albuterol rescue inhaler 2 puffs every 4 to 6 hours as needed for shortness of breath, chest tightness, coughing, and wheezing. May use albuterol rescue inhaler 2 puffs 5 to 15 minutes prior to strenuous physical activities. Monitor frequency of use.  . Asthma control goals:  o Full participation in all desired activities (may need albuterol before activity) o Albuterol use two times or less a week on average (not counting use with activity) o Cough interfering with sleep two times or less a month o Oral steroids no more than once a year o No hospitalizations  Follow up in 3 months or sooner if needed.   Skin care recommendations  Bath time: . Always use lukewarm water. AVOID very hot or cold water. Marland Kitchen Keep bathing time to 5-10 minutes. . Do NOT use bubble bath. . Use a mild  soap and use just enough to wash the dirty areas. . Do NOT scrub skin vigorously.  . After bathing, pat dry your skin with a towel. Do NOT rub or scrub the skin.  Moisturizers and prescriptions:  . ALWAYS apply moisturizers immediately after bathing (within 3 minutes). This helps to lock-in moisture. . Use the moisturizer several times a day over the whole body. Kermit Balo summer moisturizers include: Aveeno, CeraVe, Cetaphil. Kermit Balo winter moisturizers include: Aquaphor, Vaseline, Cerave, Cetaphil, Eucerin, Vanicream. . When using moisturizers along with medications, the moisturizer should be applied about one hour after applying the medication to prevent diluting effect of the medication or moisturize around where you applied the medications. When not using medications, the moisturizer can be continued twice daily as maintenance.  Laundry and clothing: . Avoid laundry products with added color or perfumes. . Use unscented hypo-allergenic laundry products such as Tide free, Cheer free & gentle, and All free and clear.  . If the skin still seems dry or sensitive, you can try double-rinsing the clothes. . Avoid tight or scratchy clothing such as wool. . Do not use fabric softeners or dyer sheets.

## 2020-08-15 NOTE — Assessment & Plan Note (Addendum)
Mild rhinitis symptoms.  Today's skin testing was negative to environmental allergies but her positive control had a borderline reactivity.  We will double check with blood work.  Monitor symptoms.   Will make additional recommendations once the bloodwork is back.

## 2020-08-27 ENCOUNTER — Other Ambulatory Visit: Payer: Self-pay

## 2020-08-27 DIAGNOSIS — R634 Abnormal weight loss: Secondary | ICD-10-CM

## 2020-09-04 LAB — CBC WITH DIFFERENTIAL/PLATELET
Basophils Absolute: 0 10*3/uL (ref 0.0–0.2)
Basos: 1 %
EOS (ABSOLUTE): 0.2 10*3/uL (ref 0.0–0.4)
Eos: 3 %
Hematocrit: 28.4 % — ABNORMAL LOW (ref 34.0–46.6)
Hemoglobin: 8.9 g/dL — ABNORMAL LOW (ref 11.1–15.9)
Immature Grans (Abs): 0 10*3/uL (ref 0.0–0.1)
Immature Granulocytes: 0 %
Lymphocytes Absolute: 2.9 10*3/uL (ref 0.7–3.1)
Lymphs: 43 %
MCH: 25.6 pg — ABNORMAL LOW (ref 26.6–33.0)
MCHC: 31.3 g/dL — ABNORMAL LOW (ref 31.5–35.7)
MCV: 82 fL (ref 79–97)
Monocytes Absolute: 0.6 10*3/uL (ref 0.1–0.9)
Monocytes: 9 %
Neutrophils Absolute: 2.9 10*3/uL (ref 1.4–7.0)
Neutrophils: 44 %
Platelets: 426 10*3/uL (ref 150–450)
RBC: 3.47 x10E6/uL — ABNORMAL LOW (ref 3.77–5.28)
RDW: 16.1 % — ABNORMAL HIGH (ref 11.7–15.4)
WBC: 6.5 10*3/uL (ref 3.4–10.8)

## 2020-09-04 LAB — ALLERGENS W/TOTAL IGE AREA 2

## 2020-09-04 LAB — THYROID CASCADE PROFILE: TSH: 2.22 u[IU]/mL (ref 0.450–4.500)

## 2020-09-04 LAB — COMPREHENSIVE METABOLIC PANEL
ALT: 13 IU/L (ref 0–32)
AST: 23 IU/L (ref 0–40)
Albumin/Globulin Ratio: 1.1 — ABNORMAL LOW (ref 1.2–2.2)
Albumin: 3.7 g/dL — ABNORMAL LOW (ref 3.8–4.9)
Alkaline Phosphatase: 75 IU/L (ref 44–121)
BUN/Creatinine Ratio: 8 — ABNORMAL LOW (ref 9–23)
BUN: 9 mg/dL (ref 6–24)
Bilirubin Total: 0.2 mg/dL (ref 0.0–1.2)
CO2: 24 mmol/L (ref 20–29)
Calcium: 8.5 mg/dL — ABNORMAL LOW (ref 8.7–10.2)
Chloride: 101 mmol/L (ref 96–106)
Creatinine, Ser: 1.1 mg/dL — ABNORMAL HIGH (ref 0.57–1.00)
GFR calc Af Amer: 67 mL/min/{1.73_m2} (ref >59)
GFR calc non Af Amer: 58 mL/min/{1.73_m2} — ABNORMAL LOW (ref >59)
Globulin, Total: 3.5 g/dL (ref 1.5–4.5)
Glucose: 92 mg/dL (ref 65–99)
Potassium: 4.3 mmol/L (ref 3.5–5.2)
Sodium: 142 mmol/L (ref 134–144)
Total Protein: 7.2 g/dL (ref 6.0–8.5)

## 2020-09-04 LAB — ALPHA-GAL PANEL
Alpha Gal IgE*: <0.1 kU/L (ref <0.10)
Beef (Bos spp) IgE: <0.1 kU/L (ref <0.35)
Class Interpretation: 0
Class Interpretation: 0
Class Interpretation: 0
Lamb/Mutton (Ovis spp) IgE: <0.1 kU/L (ref <0.35)
Pork (Sus spp) IgE: <0.1 kU/L (ref <0.35)

## 2020-09-04 LAB — ALLERGEN CHOCOLATE: Chocolate/Cacao IgE: <0.1 kU/L

## 2020-09-04 LAB — ANA W/REFLEX: Anti Nuclear Antibody (ANA): NEGATIVE

## 2020-09-04 LAB — TRYPTASE: Tryptase: 4.3 ug/L (ref 2.2–13.2)

## 2020-09-05 ENCOUNTER — Institutional Professional Consult (permissible substitution): Payer: Medicare Other | Admitting: Pulmonary Disease

## 2020-10-16 ENCOUNTER — Emergency Department (HOSPITAL_COMMUNITY)
Admission: EM | Admit: 2020-10-16 | Discharge: 2020-10-16 | Disposition: A | Discharge status: Home / Self Care | Payer: Medicare Other | Attending: Emergency Medicine | Admitting: Emergency Medicine

## 2020-10-16 ENCOUNTER — Other Ambulatory Visit: Payer: Self-pay

## 2020-10-16 DIAGNOSIS — R0602 Shortness of breath: Secondary | ICD-10-CM | POA: Diagnosis present

## 2020-10-16 DIAGNOSIS — R509 Fever, unspecified: Secondary | ICD-10-CM | POA: Diagnosis not present

## 2020-10-16 DIAGNOSIS — R1110 Vomiting, unspecified: Secondary | ICD-10-CM | POA: Diagnosis not present

## 2020-10-16 DIAGNOSIS — R079 Chest pain, unspecified: Secondary | ICD-10-CM | POA: Diagnosis not present

## 2020-10-16 DIAGNOSIS — Z5321 Procedure and treatment not carried out due to patient leaving prior to being seen by health care provider: Secondary | ICD-10-CM | POA: Diagnosis not present

## 2020-10-16 DIAGNOSIS — R059 Cough, unspecified: Secondary | ICD-10-CM | POA: Insufficient documentation

## 2020-10-16 MED ORDER — ALBUTEROL SULFATE HFA 108 (90 BASE) MCG/ACT IN AERS: 2.0000 | INHALATION_SPRAY | RESPIRATORY_TRACT | Status: DC | PRN

## 2020-10-16 NOTE — ED Triage Notes (Signed)
Pt c/o ShOB, chest pain with coughing x3 days.  Pt reports fever of 104.0 at home, taking tylenol at home with little relief.  Pt also reports emesis, poor PO intake.

## 2020-11-19 ENCOUNTER — Ambulatory Visit (INDEPENDENT_AMBULATORY_CARE_PROVIDER_SITE_OTHER): Payer: Medicare Other | Admitting: Allergy

## 2020-11-19 ENCOUNTER — Encounter: Payer: Self-pay | Admitting: Allergy

## 2020-11-19 ENCOUNTER — Other Ambulatory Visit: Payer: Self-pay

## 2020-11-19 DIAGNOSIS — J4531 Mild persistent asthma with (acute) exacerbation: Secondary | ICD-10-CM | POA: Diagnosis not present

## 2020-11-19 DIAGNOSIS — D649 Anemia, unspecified: Secondary | ICD-10-CM

## 2020-11-19 DIAGNOSIS — L299 Pruritus, unspecified: Secondary | ICD-10-CM | POA: Diagnosis not present

## 2020-11-19 DIAGNOSIS — J45909 Unspecified asthma, uncomplicated: Secondary | ICD-10-CM

## 2020-11-19 MED ORDER — CETIRIZINE HCL 10 MG PO TABS: 10.0000 mg | ORAL_TABLET | Freq: Two times a day (BID) | ORAL | 2 refills | Status: DC | PRN

## 2020-11-19 NOTE — Patient Instructions (Addendum)
PLEASE BRING all your inhalers and medications to your next visit.   FOLLOW UP with your PCP regarding the anemia.  See attached bloodwork results.   Asthma: . Daily controller medication(s): start Symbicort 184mcg 2 puffs twice a day and rinse mouth after each use.  Take everyday!  . May use albuterol rescue inhaler 2 puffs or nebulizer every 4 to 6 hours as needed for shortness of breath, chest tightness, coughing, and wheezing. May use albuterol rescue inhaler 2 puffs 5 to 15 minutes prior to strenuous physical activities. Monitor frequency of use.  . Asthma control goals:  o Full participation in all desired activities (may need albuterol before activity) o Albuterol use two times or less a week on average (not counting use with activity) o Cough interfering with sleep two times or less a month o Oral steroids no more than once a year o No hospitalizations  Itching:  Continue proper skin care - make sure you use fragrance free and dye free products.   The most common cause of itching is usually dry skin - make sure you moisturize daily.   Start taking cetirizine 10mg  1-2 times a day as needed for the itching.   If it makes you too drowsy let us know.  If it's not covered let us know.   Follow up in 1 month or sooner if needed.   Skin care recommendations  Bath time: . Always use lukewarm water. AVOID very hot or cold water. Marland Kitchen Keep bathing time to 5-10 minutes. . Do NOT use bubble bath. . Use a mild soap and use just enough to wash the dirty areas. . Do NOT scrub skin vigorously.  . After bathing, pat dry your skin with a towel. Do NOT rub or scrub the skin.  Moisturizers and prescriptions:  . ALWAYS apply moisturizers immediately after bathing (within 3 minutes). This helps to lock-in moisture. . Use the moisturizer several times a day over the whole body. Kermit Balo summer moisturizers include: Aveeno, CeraVe, Cetaphil. Kermit Balo winter moisturizers include: Aquaphor,  Vaseline, Cerave, Cetaphil, Eucerin, Vanicream. . When using moisturizers along with medications, the moisturizer should be applied about one hour after applying the medication to prevent diluting effect of the medication or moisturize around where you applied the medications. When not using medications, the moisturizer can be continued twice daily as maintenance.  Laundry and clothing: . Avoid laundry products with added color or perfumes. . Use unscented hypo-allergenic laundry products such as Tide free, Cheer free & gentle, and All free and clear.  . If the skin still seems dry or sensitive, you can try double-rinsing the clothes. . Avoid tight or scratchy clothing such as wool. . Do not use fabric softeners or dyer sheets.

## 2020-11-19 NOTE — Assessment & Plan Note (Signed)
Past history - Main complaint of pruritus which has been worsening the last few years.  Patient is unsure if there is an associated rash at times.  This occurs on her back and abdominal area.  Tried Benadryl and hydroxyzine with some benefit. Sometimes worsens after chocolate ingestion. 2021 skin testing showed: Negative to indoor/outdoor allergens, common foods and chocolate. The positive control was borderline positive questioning the validity of results.  Interim history - 2021 bloodwork (CMP, TSH, ANA, tryptase, alpha gale, chocolate and environmental allergy panel) all unremarkable. Pruritis slightly improved.  Continue proper skin care - make sure to use fragrance free and dye free products.   The most common cause of itching is usually dry skin - make sure to moisturize daily.   Start taking cetirizine 10mg  1-2 times a day as needed for the itching.   If it makes you too drowsy let us know.  If it's not covered let us know.

## 2020-11-19 NOTE — Assessment & Plan Note (Signed)
Past history - Diagnosed with asthma many years ago.  2021 spirometry showed some restriction with 12% improvement in FEV1 post bronchodilator treatment.  Clinically feeling improved. Interm history - dyspnea on exertion mainly. Not using inhalers appropriately.   Advised patient to bring all inhalers/medications to next visit as there seems to be some confusion as to what she's supposed to be taking when.   Her symptoms are most likely multifactorial and her physical deconditioning and anemia are contributing to them.  . Daily controller medication(s): start Symbicort 115mcg 2 puffs twice a day and rinse mouth after each use.  Take everyday! . May use albuterol rescue inhaler 2 puffs or nebulizer every 4 to 6 hours as needed for shortness of breath, chest tightness, coughing, and wheezing. May use albuterol rescue inhaler 2 puffs 5 to 15 minutes prior to strenuous physical activities. Monitor frequency of use.  . Repeat spirometry at next visit.

## 2020-11-19 NOTE — Progress Notes (Signed)
RE: Danielle [name] MRN: [id number] DOB: 06-20-1971 Date of Telemedicine Visit: 11/19/2020  Referring provider: Trey Sailors, PA Primary care provider: Trey Sailors, PA  Chief Complaint: Asthma (A stronger inhaler has been given. Also wants results from labs. Wants something for itching. /)   Telemedicine Follow Up Visit via Telephone: I connected with Danielle [name] for a follow up on 11/19/20 by telephone and verified that [name] using two identifiers.   I discussed the limitations, risks, security and privacy concerns of performing an evaluation and management service by telephone and the availability of in person appointments. I also discussed with the patient that there may be a patient responsible charge related to this service. The patient expressed understanding and agreed to proceed.  Patient is at home/work. Provider is at the office.  Visit start time: 11:06AM Visit end time: 11:37AM Insurance consent/check in by: front desk Medical consent and medical assistant/nurse: Danielle S.  History of Present Illness: She is a 50 y.o. female, who is being followed for pruritus, nonallergic rhinitis and asthma. Her previous allergy office visit was on 08/15/2020 with Danielle [name]. Today is a regular follow up visit.  Asthma:  Her breathing has not been doing well and has mainly dyspnea on exertion and also gets shortness of breath when she gets upset. She is not sure how long these episodes lasts. She was using Symbicort as her rescue inhaler during the above episodes with some benefit.  She wasn't using a daily maintenance inhaler.  She also has a nebulizer at home.   No oral prednisone use but was given a steroid injection 2-3 weeks ago with some benefit.  She did not follow up with PCP regarding her anemia issues.   Pruritus Itching is not as bad with using fragrance free and dye free products. Using Vanicream  moisturizer.  Patient did not take zyrtec as she never got a prescription for this. She would like to try it.   Chronic rhinitis Resolved.   Assessment and Plan: Danielle [name] is a 50 y.o. female with: Asthma, not well controlled Past history - Diagnosed with asthma many years ago.  2021 spirometry showed some restriction with 12% improvement in FEV1 post bronchodilator treatment.  Clinically feeling improved. Interm history - dyspnea on exertion mainly. Not using inhalers appropriately.   Advised patient to bring all inhalers/medications to next visit as there seems to be some confusion as to what she's supposed to be taking when.   Her symptoms are most likely multifactorial and her physical deconditioning and anemia are contributing to them.  . Daily controller medication(s): start Symbicort 110mcg 2 puffs twice a day and rinse mouth after each use.  Take everyday! . May use albuterol rescue inhaler 2 puffs or nebulizer every 4 to 6 hours as needed for shortness of breath, chest tightness, coughing, and wheezing. May use albuterol rescue inhaler 2 puffs 5 to 15 minutes prior to strenuous physical activities. Monitor frequency of use.  . Repeat spirometry at next visit.   Pruritus Past history - Main complaint of pruritus which has been worsening the last few years.  Patient is unsure if there is an associated rash at times.  This occurs on her back and abdominal area.  Tried Benadryl and hydroxyzine with some benefit. Sometimes worsens after chocolate ingestion. 2021 skin testing showed: Negative to indoor/outdoor allergens, common foods and chocolate. The positive control was borderline positive questioning the validity of results.  Interim history -  2021 bloodwork (CMP, TSH, ANA, tryptase, alpha gale, chocolate and environmental allergy panel) all unremarkable. Pruritis slightly improved.  Continue proper skin care - make sure to use fragrance free and dye free products.   The most common  cause of itching is usually dry skin - make sure to moisturize daily.   Start taking cetirizine 10mg  1-2 times a day as needed for the itching.   If it makes you too drowsy let us know.  If it's not covered let us know.   Return in about 4 weeks (around 12/17/2020).  Meds ordered this encounter  Medications  . cetirizine (ZYRTEC ALLERGY) 10 MG tablet    Sig: Take 1 tablet (10 mg total) by mouth 2 (two) times daily as needed (itching).    Dispense:  60 tablet    Refill:  2   Lab Orders  No laboratory test(s) ordered today    Diagnostics: None.  Medication List:  Current Outpatient Medications  Medication Sig Dispense Refill  . acetaminophen (TYLENOL) 500 MG tablet Take 1,000 mg by mouth every 6 (six) hours as needed for mild pain or moderate pain.    Marland Kitchen albuterol (PROVENTIL) (2.5 MG/3ML) 0.083% nebulizer solution 3 ml    . albuterol (VENTOLIN HFA) 108 (90 Base) MCG/ACT inhaler SMARTSIG:2 Puff(s) By Mouth 4 Times Daily    . amoxicillin (AMOXIL) 500 MG capsule Take 500 mg by mouth every 8 (eight) hours.    . cetirizine (ZYRTEC ALLERGY) 10 MG tablet Take 1 tablet (10 mg total) by mouth 2 (two) times daily as needed (itching). 60 tablet 2  . diphenhydrAMINE (BENADRYL) 25 MG tablet Take 75 mg by mouth every 6 (six) hours as needed for itching.    . DULoxetine (CYMBALTA) 30 MG capsule Take 30 mg by mouth 2 (two) times daily.    . famotidine (PEPCID) 20 MG tablet Take 1 tablet (20 mg total) by mouth 2 (two) times daily. (Patient taking differently: Take 20-120 mg by mouth daily as needed for heartburn.) 30 tablet 0  . gabapentin (NEURONTIN) 300 MG capsule Take 300 mg by mouth at bedtime.    . hydrochlorothiazide (HYDRODIURIL) 25 MG tablet Take 25 mg by mouth daily.    . hydrOXYzine (ATARAX/VISTARIL) 25 MG tablet Take 12.5 mg by mouth at bedtime.    Marland Kitchen omeprazole (PRILOSEC) 40 MG capsule Take 40 mg by mouth daily.    . pantoprazole (PROTONIX) 40 MG tablet Take 1 tablet (40 mg total) by  mouth daily. 30 tablet 11  . promethazine-dextromethorphan (PROMETHAZINE-DM) 6.25-15 MG/5ML syrup Take 5 mLs by mouth every 6 (six) hours.    . sertraline (ZOLOFT) 50 MG tablet Take 50 mg by mouth at bedtime.    . SYMBICORT 160-4.5 MCG/ACT inhaler SMARTSIG:2 Puff(s) By Mouth Twice Daily     No current facility-administered medications for this visit.   Allergies: Allergies  Allergen Reactions  . Percocet [Oxycodone-Acetaminophen] Nausea And Vomiting   I reviewed her past medical history, social history, family history, and environmental history and no significant changes have been reported from her previous visit.  Review of Systems  Constitutional: Negative for appetite change, chills, fever and unexpected weight change.  HENT: Negative for congestion and rhinorrhea.   Eyes: Negative for itching.  Respiratory: Positive for shortness of breath.   Cardiovascular: Negative for chest pain.  Gastrointestinal: Negative for abdominal pain.  Genitourinary: Negative for difficulty urinating.  Skin:       Pruritus  Allergic/Immunologic: Negative for environmental allergies.  Neurological: Negative for  headaches.   Objective: Physical Exam Not obtained as encounter was done via telephone.   Previous notes and tests were reviewed.  I discussed the assessment and treatment plan with the patient. The patient was provided an opportunity to ask questions and all were answered. The patient agreed with the plan and demonstrated an understanding of the instructions. After visit summary/patient instructions available via mail.   The patient was advised to call back or seek an in-person evaluation if the symptoms worsen or if the condition fails to improve as anticipated.  I provided 31 minutes of non-face-to-face time during this encounter.  It was my pleasure to participate in [place] care today. Please feel free to contact me with any questions or concerns.   Sincerely,  Rexene Alberts, DO Allergy & Immunology  [facility] office: [place] office: [phone]

## 2020-12-06 ENCOUNTER — Ambulatory Visit: Payer: Medicare Other | Admitting: Orthopaedic Surgery

## 2020-12-18 NOTE — Progress Notes (Deleted)
Follow Up Note  RE: Danielle [name] MRN: [id number] DOB: Jul 01, 1971 Date of Office Visit: 12/19/2020  Referring provider: Trey Sailors, PA Primary care provider: Trey Sailors, PA  Chief Complaint: No chief complaint on file.  History of Present Illness: I had the pleasure of seeing Roe Koffman for a follow up visit at the [facility] on 12/18/2020. She is a 50 y.o. female, who is being followed for asthma and pruritus. Her previous allergy office visit was on 11/19/2020 with Dr. Maudie Mercury via telemedicine. Today is a regular follow up visit.  Asthma, not well controlled Past history - Diagnosed with asthma many years ago.  2021 spirometry showed some restriction with 12% improvement in FEV1 post bronchodilator treatment.  Clinically feeling improved. Interm history - dyspnea on exertion mainly. Not using inhalers appropriately.   Advised patient to bring all inhalers/medications to next visit as there seems to be some confusion as to what she's supposed to be taking when.   Her symptoms are most likely multifactorial and her physical deconditioning and anemia are contributing to them.   Daily controller medication(s):start Symbicort 173mcg 2 puffs twice a day and rinse mouth after each use.  Take everyday!  May use albuterol rescue inhaler 2 puffs or nebulizer every 4 to 6 hours as needed for shortness of breath, chest tightness, coughing, and wheezing. May use albuterol rescue inhaler 2 puffs 5 to 15 minutes prior to strenuous physical activities. Monitor frequency of use.   Repeat spirometry at next visit.   Pruritus Past history - Main complaint of pruritus which has been worsening the last few years.  Patient is unsure if there is an associated rash at times.  This occurs on her back and abdominal area.  Tried Benadryl and hydroxyzine with some benefit. Sometimes worsens after chocolate ingestion. 2021 skin testing showed: Negative to  indoor/outdoor allergens, common foods and chocolate. The positive control was borderline positive questioning the validity of results.  Interim history - 2021 bloodwork (CMP, TSH, ANA, tryptase, alpha gale, chocolate and environmental allergy panel) all unremarkable. Pruritis slightly improved.  Continue proper skin care - make sure to use fragrance free and dye free products.   The most common cause of itching is usually dry skin - make sure to moisturize daily.   Start taking cetirizine 10mg  1-2 times a day as needed for the itching.  ? If it makes you too drowsy let us know. ? If it's not covered let us know.   Return in about 4 weeks (around 12/17/2020).  Assessment and Plan: Foster is a 50 y.o. female with: No problem-specific Assessment & Plan notes found for this encounter.  No follow-ups on file.  No orders of the defined types were placed in this encounter.  Lab Orders  No laboratory test(s) ordered today    Diagnostics: Spirometry:  Tracings reviewed. Her effort: {Blank single:19197::"Good reproducible efforts.","It was hard to get consistent efforts and there is a question as to whether this reflects a maximal maneuver.","Poor effort, data can not be interpreted."} FVC: ***L FEV1: ***L, ***% predicted FEV1/FVC ratio: ***% Interpretation: {Blank single:19197::"Spirometry consistent with mild obstructive disease","Spirometry consistent with moderate obstructive disease","Spirometry consistent with severe obstructive disease","Spirometry consistent with possible restrictive disease","Spirometry consistent with mixed obstructive and restrictive disease","Spirometry uninterpretable due to technique","Spirometry consistent with normal pattern","No overt abnormalities noted given today's efforts"}.  Please see scanned spirometry results for details.  Skin Testing: {Blank single:19197::"Select foods","Environmental allergy panel","Environmental allergy panel and select  foods","Food  allergy panel","None","Deferred due to recent antihistamines use"}. Positive test to: ***. Negative test to: ***.  Results discussed with patient/family.   Medication List:  Current Outpatient Medications  Medication Sig Dispense Refill  . acetaminophen (TYLENOL) 500 MG tablet Take 1,000 mg by mouth every 6 (six) hours as needed for mild pain or moderate pain.    Marland Kitchen albuterol (PROVENTIL) (2.5 MG/3ML) 0.083% nebulizer solution 3 ml    . albuterol (VENTOLIN HFA) 108 (90 Base) MCG/ACT inhaler SMARTSIG:2 Puff(s) By Mouth 4 Times Daily    . amoxicillin (AMOXIL) 500 MG capsule Take 500 mg by mouth every 8 (eight) hours.    . cetirizine (ZYRTEC ALLERGY) 10 MG tablet Take 1 tablet (10 mg total) by mouth 2 (two) times daily as needed (itching). 60 tablet 2  . diphenhydrAMINE (BENADRYL) 25 MG tablet Take 75 mg by mouth every 6 (six) hours as needed for itching.    . DULoxetine (CYMBALTA) 30 MG capsule Take 30 mg by mouth 2 (two) times daily.    . famotidine (PEPCID) 20 MG tablet Take 1 tablet (20 mg total) by mouth 2 (two) times daily. (Patient taking differently: Take 20-120 mg by mouth daily as needed for heartburn.) 30 tablet 0  . gabapentin (NEURONTIN) 300 MG capsule Take 300 mg by mouth at bedtime.    . hydrochlorothiazide (HYDRODIURIL) 25 MG tablet Take 25 mg by mouth daily.    . hydrOXYzine (ATARAX/VISTARIL) 25 MG tablet Take 12.5 mg by mouth at bedtime.    Marland Kitchen omeprazole (PRILOSEC) 40 MG capsule Take 40 mg by mouth daily.    . pantoprazole (PROTONIX) 40 MG tablet Take 1 tablet (40 mg total) by mouth daily. 30 tablet 11  . promethazine-dextromethorphan (PROMETHAZINE-DM) 6.25-15 MG/5ML syrup Take 5 mLs by mouth every 6 (six) hours.    . sertraline (ZOLOFT) 50 MG tablet Take 50 mg by mouth at bedtime.    . SYMBICORT 160-4.5 MCG/ACT inhaler SMARTSIG:2 Puff(s) By Mouth Twice Daily     No current facility-administered medications for this visit.   Allergies: Allergies  Allergen  Reactions  . Percocet [Oxycodone-Acetaminophen] Nausea And Vomiting   I reviewed her past medical history, social history, family history, and environmental history and no significant changes have been reported from her previous visit.  Review of Systems  Constitutional: Negative for appetite change, chills, fever and unexpected weight change.  HENT: Negative for congestion and rhinorrhea.   Eyes: Negative for itching.  Respiratory: Positive for shortness of breath.   Cardiovascular: Negative for chest pain.  Gastrointestinal: Negative for abdominal pain.  Genitourinary: Negative for difficulty urinating.  Skin:       Pruritus  Allergic/Immunologic: Negative for environmental allergies.  Neurological: Negative for headaches.   Objective: There were no vitals taken for this visit. There is no height or weight on file to calculate BMI. Physical Exam Vitals and nursing note reviewed.  Constitutional:      Appearance: Normal appearance. She is well-developed. She is obese.  HENT:     Head: Normocephalic and atraumatic.     Right Ear: Tympanic membrane and external ear normal.     Left Ear: Tympanic membrane and external ear normal.     Nose: Nose normal.     Mouth/Throat:     Mouth: Mucous membranes are moist.     Pharynx: Oropharynx is clear.  Eyes:     Conjunctiva/sclera: Conjunctivae normal.  Cardiovascular:     Rate and Rhythm: Normal rate and regular rhythm.     Heart  sounds: Normal heart sounds. No murmur heard. No friction rub. No gallop.   Pulmonary:     Effort: Pulmonary effort is normal.     Breath sounds: Normal breath sounds. No wheezing, rhonchi or rales.  Musculoskeletal:     Cervical back: Neck supple.  Skin:    General: Skin is warm.     Findings: No rash.  Neurological:     Mental Status: She is alert and oriented to person, place, and time.  Psychiatric:        Behavior: Behavior normal.    Previous notes and tests were reviewed. The plan was  reviewed with the patient/family, and all questions/concerned were addressed.  It was my pleasure to see Jaidyn today and participate in her care. Please feel free to contact me with any questions or concerns.  Sincerely,  Rexene Alberts, DO Allergy & Immunology  [facility] [place] office: [place] office: [phone]

## 2020-12-19 ENCOUNTER — Ambulatory Visit: Payer: Medicare Other | Admitting: Allergy

## 2020-12-19 DIAGNOSIS — L299 Pruritus, unspecified: Secondary | ICD-10-CM

## 2020-12-19 DIAGNOSIS — J309 Allergic rhinitis, unspecified: Secondary | ICD-10-CM

## 2021-02-25 ENCOUNTER — Telehealth: Payer: Self-pay

## 2021-02-25 ENCOUNTER — Ambulatory Visit: Payer: Self-pay

## 2021-02-25 ENCOUNTER — Inpatient Hospital Stay (HOSPITAL_COMMUNITY): Admission: RE | Admit: 2021-02-25 | Payer: Medicare Other | Source: Ambulatory Visit

## 2021-02-25 ENCOUNTER — Ambulatory Visit (INDEPENDENT_AMBULATORY_CARE_PROVIDER_SITE_OTHER): Payer: Medicare Other | Admitting: Orthopedic Surgery

## 2021-02-25 ENCOUNTER — Encounter: Payer: Self-pay | Admitting: Orthopedic Surgery

## 2021-02-25 VITALS — Ht 65.0 in | Wt 317.0 lb

## 2021-02-25 DIAGNOSIS — I82461 Acute embolism and thrombosis of right calf muscular vein: Secondary | ICD-10-CM | POA: Diagnosis not present

## 2021-02-25 DIAGNOSIS — M79604 Pain in right leg: Secondary | ICD-10-CM | POA: Diagnosis not present

## 2021-02-25 MED ORDER — RIVAROXABAN (XARELTO) VTE STARTER PACK (15 & 20 MG): ORAL_TABLET | ORAL | 0 refills | Status: DC

## 2021-02-25 NOTE — Telephone Encounter (Signed)
Have called patient multiple times to let her know since she can't get the Doppler today Dr. Sharol Given will send in some Xarelto. Neither phone number had a voicemail for me to leave a message

## 2021-02-25 NOTE — Progress Notes (Signed)
Office Visit Note   [name]           Date of Birth: [birth year]           MRN: [id number] Visit Date: 02/25/2021              Requested by: Trey Sailors, [place],  [place] [address] PCP: Trey Sailors, PA  Chief Complaint  Patient presents with  . Right Knee - Pain      HPI: Patient is a 50 year old woman who is seen for acute pain posterior medial of the popliteal fossa right knee..  Patient is status post open reduction internal fixation for tibial plateau over a year ago.  Patient states this is acute denies any recent trauma.  Assessment & Plan: Visit Diagnoses:  1. Pain in right leg   2. Acute deep vein thrombosis (DVT) of calf muscle vein of right lower extremity ([place])     Plan: We will send patient over for an ultrasound to rule out DVT.  Follow-Up Instructions: Return in about 1 week (around 03/04/2021).   Ortho Exam  Patient is alert, oriented, no adenopathy, well-dressed, normal affect, normal respiratory effort. Examination patient has pain to palpation over the greater saphenous vein as well as posterior medial and the popliteal space consistent with the track of the greater saphenous vein.  The joint line is minimally tender and does not reproduce her pain to palpation.  There is no knee effusion no redness.  Radiographs do not show any acute changes to the right knee.  Imaging: XR Knee 1-2 Views Right  Result Date: 02/25/2021 2 view radiographs of the right knee shows previous fixation for tibial plateau fracture.  Patient has good restoration of the joint space with no joint space narrowing.  No images are attached to the encounter.  Labs: Lab Results  Component Value Date   REPTSTATUS 11/28/2015 FINAL 11/26/2015   CULT  11/26/2015    >=100,000 COLONIES/mL ESCHERICHIA COLI Performed at [facility] 11/26/2015     Lab Results  Component Value Date   ALBUMIN 3.7  (L) 08/29/2020   ALBUMIN 3.1 (L) 08/15/2018   ALBUMIN 3.5 05/13/2016    No results found for: MG No results found for: VD25OH  No results found for: PREALBUMIN CBC EXTENDED Latest Ref Rng & Units 08/29/2020 08/15/2018 05/13/2016  WBC 3.4 - 10.8 x10E3/uL 6.5 6.3 7.6  RBC 3.77 - 5.28 x10E6/uL 3.47(L) 3.45(L) 3.77(L)  HGB 11.1 - 15.9 g/dL 8.9(L) 9.6(L) 10.2(L)  HCT 34.0 - 46.6 % 28.4(L) 31.2(L) 31.9(L)  PLT 150 - 450 x10E3/uL 426 322 409(H)  NEUTROABS 1.4 - 7.0 x10E3/uL 2.9 2.4 -  LYMPHSABS 0.7 - 3.1 x10E3/uL 2.9 3.2 -     Body mass index is 52.75 kg/m.  Orders:  Orders Placed This Encounter  Procedures  . XR Knee 1-2 Views Right  . VAS [place] LOWER EXTREMITY VENOUS (DVT)   No orders of the defined types were placed in this encounter.    Procedures: No procedures performed  Clinical Data: No additional findings.  ROS:  All other systems negative, except as noted in the HPI. Review of Systems  Objective: Vital Signs: Ht 5\' 5"  (1.651 m)   Wt (!) 317 lb (143.8 kg)   BMI 52.75 kg/m   Specialty Comments:  No specialty comments available.  PMFS History: Patient Active Problem List   Diagnosis Date Noted  . Pruritus 08/15/2020  .  Asthma, not well controlled 08/15/2020  . Heartburn 08/15/2020  . Closed nondisplaced fracture of neck of fifth metacarpal bone of right hand with routine healing 12/13/2015  . HYPERTENSION 11/12/2010  . OBESITY 11/12/2010  . COCAINE DEPENDENCE, IN REMISSION 11/12/2010  . DEPRESSION 11/12/2010  . GERD 11/12/2010  . BACK PAIN 11/12/2010   Past Medical History:  Diagnosis Date  . Arthritis   . Asthma   . Bipolar 1 disorder ([facility])   . 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 GERD (gastroesophageal reflux disease)   . Hypertension   . Migraines   . Sleep apnea    states had test-no cpap ordered-does snore    Family History  Problem Relation Age of Onset  . Cancer Father   . High blood pressure Father   .  Asthma Sister   . Eczema Sister   . Allergic rhinitis Neg Hx   . Urticaria Neg Hx     Past Surgical History:  Procedure Laterality Date  . BIOPSY  03/08/2020   Procedure: BIOPSY;  Surgeon: [name], MD;  Location: [facility];  Service: Gastroenterology;;  . COLONOSCOPY WITH PROPOFOL N/A 03/08/2020   Procedure: COLONOSCOPY WITH PROPOFOL;  Surgeon: [name], MD;  Location: [facility];  Service: Gastroenterology;  Laterality: N/A;  . ESOPHAGOGASTRODUODENOSCOPY (EGD) WITH PROPOFOL N/A 03/08/2020   Procedure: ESOPHAGOGASTRODUODENOSCOPY (EGD) WITH PROPOFOL;  Surgeon: [name], MD;  Location: [facility];  Service: Gastroenterology;  Laterality: N/A;  . FRACTURE SURGERY     rt femer/knee-plate-screws-  . ORIF ANKLE FRACTURE  03/16/2012   Procedure: OPEN REDUCTION INTERNAL FIXATION (ORIF) ANKLE FRACTURE;  Surgeon: Colin Rhein, MD;  Location: [facility];  Service: Orthopedics;  Laterality: Right;  right lateral malleolus fracture   Social History   Occupational History  . Not on file  Tobacco Use  . Smoking status: Never Smoker  . Smokeless tobacco: Never Used  Vaping Use  . Vaping Use: Never used  Substance and Sexual Activity  . Alcohol use: No    Comment: occ  . Drug use: Yes    Types: "Crack" cocaine, Cocaine    Comment: remission 11/12  . Sexual activity: Not on file

## 2021-02-26 NOTE — Telephone Encounter (Signed)
Looks like she is having her doppler on the 12th, maybe we can give them the message when they call,-if needed

## 2021-02-26 NOTE — Telephone Encounter (Signed)
I tried to call pt again and still no answer and no voicemail available. Can you please continue to try and reach this afternoon? Thank you

## 2021-02-26 NOTE — Telephone Encounter (Signed)
I called a couple times, still no answer.

## 2021-02-26 NOTE — Telephone Encounter (Signed)
Tried to reach pt this morning still no answer unable to leave message will hold and try again.

## 2021-02-27 ENCOUNTER — Other Ambulatory Visit: Payer: Self-pay

## 2021-02-27 ENCOUNTER — Ambulatory Visit (HOSPITAL_COMMUNITY)
Admission: RE | Admit: 2021-02-27 | Discharge: 2021-02-27 | Disposition: A | Discharge status: Home / Self Care | Payer: Medicare Other | Source: Ambulatory Visit | Attending: Cardiology | Admitting: Cardiology

## 2021-02-27 ENCOUNTER — Encounter: Payer: Self-pay | Admitting: Orthopedic Surgery

## 2021-02-27 DIAGNOSIS — M79604 Pain in right leg: Secondary | ICD-10-CM

## 2021-02-27 NOTE — Telephone Encounter (Signed)
I called pt  line rings without answer. I called the pt's spouse richard moore and his number is a non working number. I called the pt's sister listed in contacts Marcie Bal and asked if she would please reach out to the pt and ask her to call the office we have been trying for several days to get in touch with her.

## 2021-02-27 NOTE — Telephone Encounter (Signed)
Despite having left a message with the pt's sister I have not heard back from the pt. Called again and the phone rings with no voicemail option, She is not set up with mychart, number listed for spouse is a non working number. No other number listed besides the pt's sister as a contact and have already asked her to please have pt call the office. Have been unsuccessful in trying to reach the pt again today.

## 2021-02-28 NOTE — Telephone Encounter (Signed)
Called patient no answer.

## 2021-02-28 NOTE — Telephone Encounter (Signed)
I called patient. No answer. Couldn't leave VM. It just kept ringing. Will try again later.

## 2021-03-03 ENCOUNTER — Telehealth: Payer: Self-pay

## 2021-03-03 ENCOUNTER — Other Ambulatory Visit: Payer: Self-pay | Admitting: Physician Assistant

## 2021-03-03 MED ORDER — MELOXICAM 7.5 MG PO TABS: 7.5000 mg | ORAL_TABLET | Freq: Every day | ORAL | 1 refills | Status: DC

## 2021-03-03 NOTE — Telephone Encounter (Signed)
Pt would like some pain medication to be sent in for her

## 2021-03-03 NOTE — Telephone Encounter (Signed)
Mobic called in

## 2021-03-03 NOTE — Telephone Encounter (Signed)
She can use tylenol. If she not still taking xarelto  I can call in an anti inflammatory. She has an appointment tomorrow

## 2021-03-03 NOTE — Telephone Encounter (Signed)
She is not taking [place]

## 2021-03-05 ENCOUNTER — Telehealth: Payer: Self-pay | Admitting: Physician Assistant

## 2021-03-05 ENCOUNTER — Ambulatory Visit: Payer: Medicare Other | Admitting: Physician Assistant

## 2021-03-05 NOTE — Telephone Encounter (Signed)
I called pt and advised that we need to follow up with her in the office. She did not show for her appt today and see that she is resch for next week. Pt voiced understanding and will call with any questions.

## 2021-03-05 NOTE — Telephone Encounter (Signed)
Patient called requesting pain medication for leg pains. Please send to [facility]. Patient phone number is [phone].

## 2021-03-14 ENCOUNTER — Ambulatory Visit: Payer: Medicare Other | Admitting: Physician Assistant

## 2021-03-24 ENCOUNTER — Ambulatory Visit: Payer: Medicare Other | Admitting: Orthopedic Surgery

## 2021-03-27 ENCOUNTER — Ambulatory Visit: Payer: Medicare Other | Admitting: Orthopedic Surgery

## 2021-04-04 ENCOUNTER — Ambulatory Visit: Payer: Medicare Other | Admitting: Physician Assistant

## 2021-09-30 ENCOUNTER — Ambulatory Visit: Payer: Medicare Other | Admitting: Family

## 2021-10-20 ENCOUNTER — Other Ambulatory Visit: Payer: Self-pay

## 2021-10-20 ENCOUNTER — Encounter (HOSPITAL_BASED_OUTPATIENT_CLINIC_OR_DEPARTMENT_OTHER): Payer: Self-pay | Admitting: Obstetrics and Gynecology

## 2021-10-20 ENCOUNTER — Emergency Department (HOSPITAL_BASED_OUTPATIENT_CLINIC_OR_DEPARTMENT_OTHER): Payer: Medicare Other | Admitting: Radiology

## 2021-10-20 ENCOUNTER — Emergency Department (HOSPITAL_BASED_OUTPATIENT_CLINIC_OR_DEPARTMENT_OTHER)
Admission: EM | Admit: 2021-10-20 | Discharge: 2021-10-20 | Disposition: A | Discharge status: Home / Self Care | Payer: Medicare Other | Attending: Emergency Medicine | Admitting: Emergency Medicine

## 2021-10-20 DIAGNOSIS — M25511 Pain in right shoulder: Secondary | ICD-10-CM | POA: Diagnosis present
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
## 2021-10-20 NOTE — ED Notes (Signed)
Patient called x 2 with no answer.  Patients in lobby states they saw her leave.

## 2021-10-23 ENCOUNTER — Ambulatory Visit: Payer: Medicare Other | Admitting: Podiatry

## 2021-11-10 NOTE — Therapy (Signed)
OUTPATIENT PHYSICAL THERAPY THORACOLUMBAR EVALUATION   Patient Name: Danielle [name] MRN: [id number] DOB:October 21, 1970, 51 y.o., female Today's Date: 11/11/2021   PT End of Session - 11/11/21 1211     Visit Number 1    Number of Visits 17    Date for PT Re-Evaluation 01/06/22    Authorization Type UHC MCR    PT Start Time 1215    PT Stop Time 1256    PT Time Calculation (min) 41 min    Activity Tolerance Patient limited by pain    Behavior During Therapy [facility] for tasks assessed/performed             Past Medical History:  Diagnosis Date   Arthritis    Asthma    Bipolar 1 disorder ([place])    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[place])    crack-cocaine-11/12 last    GERD (gastroesophageal reflux disease)    Hypertension    Migraines    Sleep apnea    states had test-no cpap ordered-does snore   Past Surgical History:  Procedure Laterality Date   BIOPSY  03/08/2020   Procedure: BIOPSY;  Surgeon: [name], MD;  [facility];  Service: Gastroenterology;;   COLONOSCOPY WITH PROPOFOL N/A 03/08/2020   Procedure: COLONOSCOPY WITH PROPOFOL;  Surgeon: [name], MD;  [facility];  Service: Gastroenterology;  Laterality: N/A;   ESOPHAGOGASTRODUODENOSCOPY (EGD) WITH PROPOFOL N/A 03/08/2020   Procedure: ESOPHAGOGASTRODUODENOSCOPY (EGD) WITH PROPOFOL;  Surgeon: [name], MD;  [facility];  Service: Gastroenterology;  Laterality: N/A;   FRACTURE SURGERY     rt femer/knee-plate-screws-   ORIF ANKLE FRACTURE  03/16/2012   Procedure: OPEN REDUCTION INTERNAL FIXATION (ORIF) ANKLE FRACTURE;  Surgeon: Colin Rhein, MD;  Location: [place];  Service: Orthopedics;  Laterality: Right;  right lateral malleolus fracture   Patient Active Problem List   Diagnosis Date Noted   Pruritus 08/15/2020   Asthma, not well controlled 08/15/2020   Heartburn 08/15/2020   Closed nondisplaced fracture of neck of fifth metacarpal bone of right hand with routine  healing 12/13/2015   HYPERTENSION 11/12/2010   OBESITY 11/12/2010   COCAINE DEPENDENCE, IN REMISSION 11/12/2010   DEPRESSION 11/12/2010   GERD 11/12/2010   BACK PAIN 11/12/2010    PCP: Trey Sailors, PA  REFERRING PROVIDER: Berle Mull, MD  REFERRING DIAG:  degenerative disc disease ,lumbar radiculopathy  THERAPY DIAG:  Chronic bilateral low back pain with bilateral sciatica - Plan: PT plan of care cert/re-cert  Cervicalgia - Plan: PT plan of care cert/re-cert  Muscle weakness (generalized) - Plan: PT plan of care cert/re-cert  Other abnormalities of gait and mobility - Plan: PT plan of care cert/re-cert  Unsteadiness on feet - Plan: PT plan of care cert/re-cert  ONSET DATE: Chronic  SUBJECTIVE:  SUBJECTIVE STATEMENT: Pt presents to PT with reports of chronic neck and lower back pain with referral into bilateral posterior LE. Notes some N/T down her LE, but is unsure what makes this worse. She has had numerous falls in last few months, feels like she will be walking and feels like her legs give out. Denies red flag items such as bowel/bladder changes or saddle anesthesia. Pt has previous R LE injuries with rod in her femur and tibia, with resulting significantly weaker R LE.   PERTINENT HISTORY:  Chronic neck and lower back pain; decreased functional mobility and frequently falls  PAIN:  Are you having pain? Yes NPRS scale: 8/10 Pain location: neck and lower back PAIN TYPE: aching, dull, and heavy Pain description: intermittent  Aggravating factors: prolonged standing, walking Relieving factors: positioning  PRECAUTIONS: None  WEIGHT BEARING RESTRICTIONS No  FALLS:  Has patient fallen in last 6 months? Yes, Number of falls: numerous  LIVING ENVIRONMENT: Lives with: lives  with their family Lives in: House/apartment Stairs: Yes; External: 7 steps; none Has following equipment at home: Single point cane and Walker - 4 wheeled  OCCUPATION: on disability  PLOF: Independent and Independent with basic ADLs  PATIENT GOALS: wants to get back to walking more, decrease pain in neck and lower back   OBJECTIVE:   DIAGNOSTIC FINDINGS:  N/A  PATIENT SURVEYS:  Will perform FOTO next session  COGNITION:  Overall cognitive status: Within functional limits for tasks assessed     SENSATION:  Light touch: Appears intact   POSTURE:  Large body habitus, fwd head, rounded shoulders, increased lumbar lordosis  PALPATION: TTP to R upper trap, C7 spinous process, bilateral lumbar paraspinals  Cerivcal AROM/PROM  A/PROM A/PROM  11/11/2021  Right rotation 15  Left rotation 62   (Blank rows = not tested)  LE MMT:  MMT Right 11/11/2021 Left 11/11/2021  Hip flexion 2+/5 3+/5  Hip abduction 2+/5 3+/5  Hip adduction 2+/5 3+/5  Hip internal rotation 2+/5 3+/5  Hip external rotation 2+/5 3+/5  Knee flexion 3+/5 4/5  Knee extension 3/5 4/5   (Blank rows = not tested)  LUMBAR SPECIAL TESTS:  Straight leg raise test: Positive  FUNCTIONAL TESTS:  30"STS: 6 reps with UE use  Transfer:  Supine<>Sit - Min A  GAIT: Distance walked: 40ft Assistive device utilized: None Level of assistance: Complete Independence Comments: antalgic gait towards R    TODAY'S TREATMENT  Therapeutic Exercise: R upper trap stretch x 20" Row x 10 RTB Seated clamshell x 10 RTB Seated ball squeeze x 10 - 5" LTR x 10   PATIENT EDUCATION:  Education details: eval findings, HEP, POC Person educated: Patient Education method: Explanation, Demonstration, and Handouts Education comprehension: verbalized understanding and returned demonstration   HOME EXERCISE PROGRAM: Access Code: [email or web address]  ASSESSMENT:  CLINICAL IMPRESSION: Patient is a 51 y/o F y.o. F who was seen  today for physical therapy evaluation and treatment for chronic neck and lower back pain as well as decreased functional mobility and frequently falls. Objective impairments include Abnormal gait, decreased activity tolerance, difficulty walking, decreased ROM, decreased strength, and pain. These impairments are limiting patient from cleaning, community activity, driving, and shopping. Personal factors including 1-2 comorbidities: HTN and fitness level  are also affecting patient's functional outcome. Patient's 30 Second STS indicates she is at a severe fall risk and shows she is operating below PLOF. Patient will benefit from skilled PT to address above impairments and improve overall function.  REHAB POTENTIAL: Good  CLINICAL DECISION MAKING: Evolving/moderate complexity  EVALUATION COMPLEXITY: Moderate   GOALS: Goals reviewed with patient? No  SHORT TERM GOALS:  STG Name Target Date Goal status  1 Pt will be compliant with initial HEP for improved comfort and carryover Baseline:  12/02/2021 INITIAL  2 Pt will self report neck and lower back pain no greater than 6/10 for improved comfort and functional ability Baseline: 10/10 at worst 12/02/2021 INITIAL   LONG TERM GOALS:   LTG Name Target Date Goal status  1 Pt will improve FOTO function score to no less than predicted as proxy for functional improvement  Baseline: will assess next session 01/06/2022 INITIAL  2 Pt will self report neck and lower back pain no greater than 3/10 for improved comfort and functional ability Baseline: 10/10 at worst 01/06/2022 INITIAL  3 Pt will increase reps in 30" STS to no less than 9 without use of UE for improved balance and proximal hip strength  Baseline: 01/06/2022 INITIAL  4 Pt will improve LE strength to no less than 4/5 for tested motions in order to improved functional mobility Baseline: MMT Right 11/11/2021 Left 11/11/2021  Hip flexion 2+/5 3+/5  Hip abduction 2+/5 3+/5  Hip adduction 2+/5 3+/5   Hip internal rotation 2+/5 3+/5  Hip external rotation 2+/5 3+/5  Knee flexion 3+/5 4/5  Knee extension 3/5 4/5   01/06/2022 INITIAL   PLAN: PT FREQUENCY: 2x/week  PT DURATION: 8 weeks  PLANNED INTERVENTIONS: Therapeutic exercises, Therapeutic activity, Neuro Muscular re-education, Balance training, Gait training, Patient/Family education, Joint mobilization, Aquatic Therapy, Dry Needling, Electrical stimulation, Cryotherapy, Moist heat, Vasopneumatic device, and Manual therapy  PLAN FOR NEXT SESSION: assess FOTO, assess response to HEP, progress as able   Ward Chatters, PT 11/11/2021, 1:51 PM

## 2021-11-11 ENCOUNTER — Other Ambulatory Visit: Payer: Self-pay

## 2021-11-11 ENCOUNTER — Ambulatory Visit: Payer: Medicare Other | Attending: Sports Medicine

## 2021-11-11 DIAGNOSIS — M5441 Lumbago with sciatica, right side: Secondary | ICD-10-CM | POA: Diagnosis present

## 2021-11-11 DIAGNOSIS — M542 Cervicalgia: Secondary | ICD-10-CM | POA: Diagnosis present

## 2021-11-11 DIAGNOSIS — G8929 Other chronic pain: Secondary | ICD-10-CM | POA: Insufficient documentation

## 2021-11-11 DIAGNOSIS — R2689 Other abnormalities of gait and mobility: Secondary | ICD-10-CM | POA: Insufficient documentation

## 2021-11-11 DIAGNOSIS — M6281 Muscle weakness (generalized): Secondary | ICD-10-CM | POA: Insufficient documentation

## 2021-11-11 DIAGNOSIS — R2681 Unsteadiness on feet: Secondary | ICD-10-CM | POA: Insufficient documentation

## 2021-11-11 DIAGNOSIS — M5442 Lumbago with sciatica, left side: Secondary | ICD-10-CM | POA: Insufficient documentation

## 2021-11-17 ENCOUNTER — Other Ambulatory Visit: Payer: Self-pay

## 2021-11-17 ENCOUNTER — Ambulatory Visit: Payer: Medicare Other | Admitting: Physical Therapy

## 2021-11-17 ENCOUNTER — Encounter: Payer: Self-pay | Admitting: Physical Therapy

## 2021-11-17 DIAGNOSIS — M542 Cervicalgia: Secondary | ICD-10-CM

## 2021-11-17 DIAGNOSIS — R2689 Other abnormalities of gait and mobility: Secondary | ICD-10-CM

## 2021-11-17 DIAGNOSIS — M5442 Lumbago with sciatica, left side: Secondary | ICD-10-CM | POA: Diagnosis not present

## 2021-11-17 DIAGNOSIS — M6281 Muscle weakness (generalized): Secondary | ICD-10-CM

## 2021-11-17 NOTE — Therapy (Addendum)
OUTPATIENT PHYSICAL THERAPY THORACOLUMBAR EVALUATION/DISCHARGE  PHYSICAL THERAPY DISCHARGE SUMMARY  Visits from Start of Care: 2  Current functional level related to goals / functional outcomes: Unable to assess   Remaining deficits: Unable to assess    Education / Equipment: HEP   Patient agrees to discharge. Patient goals were  unable to assess . Patient is being discharged due to not returning since the last visit.   Patient Name: Danielle [name] MRN: [id number] DOB:1970-12-03, 51 y.o., female Today's Date: 11/17/2021   PT End of Session - 11/17/21 1338     Visit Number 2    Number of Visits 17    Date for PT Re-Evaluation 01/06/22    Authorization Type UHC MCR    PT Start Time 1320    PT Stop Time 1358    PT Time Calculation (min) 38 min             Past Medical History:  Diagnosis Date   Arthritis    Asthma    Bipolar 1 disorder ([place])    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[place])    crack-cocaine-11/12 last    GERD (gastroesophageal reflux disease)    Hypertension    Migraines    Sleep apnea    states had test-no cpap ordered-does snore   Past Surgical History:  Procedure Laterality Date   BIOPSY  03/08/2020   Procedure: BIOPSY;  Surgeon: [name], MD;  [facility];  Service: Gastroenterology;;   COLONOSCOPY WITH PROPOFOL N/A 03/08/2020   Procedure: COLONOSCOPY WITH PROPOFOL;  Surgeon: [name], MD;  [facility];  Service: Gastroenterology;  Laterality: N/A;   ESOPHAGOGASTRODUODENOSCOPY (EGD) WITH PROPOFOL N/A 03/08/2020   Procedure: ESOPHAGOGASTRODUODENOSCOPY (EGD) WITH PROPOFOL;  Surgeon: [name], MD;  [facility];  Service: Gastroenterology;  Laterality: N/A;   FRACTURE SURGERY     rt femer/knee-plate-screws-   ORIF ANKLE FRACTURE  03/16/2012   Procedure: OPEN REDUCTION INTERNAL FIXATION (ORIF) ANKLE FRACTURE;  Surgeon: Colin Rhein, MD;  Location: [facility];  Service: Orthopedics;  Laterality:  Right;  right lateral malleolus fracture   Patient Active Problem List   Diagnosis Date Noted   Pruritus 08/15/2020   Asthma, not well controlled 08/15/2020   Heartburn 08/15/2020   Closed nondisplaced fracture of neck of fifth metacarpal bone of right hand with routine healing 12/13/2015   HYPERTENSION 11/12/2010   OBESITY 11/12/2010   COCAINE DEPENDENCE, IN REMISSION 11/12/2010   DEPRESSION 11/12/2010   GERD 11/12/2010   BACK PAIN 11/12/2010    PCP: Trey Sailors, PA  REFERRING PROVIDER: Trey Sailors, PA  REFERRING DIAG:  degenerative disc disease ,lumbar radiculopathy  THERAPY DIAG:  Cervicalgia  Other abnormalities of gait and mobility  Muscle weakness (generalized)  ONSET DATE: Chronic  SUBJECTIVE:  SUBJECTIVE STATEMENT: I have been doing some exercises every day. My back is a little sore from the exercises.   PERTINENT HISTORY:  Chronic neck and lower back pain; decreased functional mobility and frequently falls  PAIN:  Are you having pain? Yes NPRS scale: 5/10 Pain location: neck and lower back PAIN TYPE: aching, dull, and heavy Pain description: intermittent  Aggravating factors: prolonged standing, walking Relieving factors: positioning  PRECAUTIONS: None  WEIGHT BEARING RESTRICTIONS No  FALLS:  Has patient fallen in last 6 months? Yes, Number of falls: numerous  LIVING ENVIRONMENT: Lives with: lives with their family Lives in: House/apartment Stairs: Yes; External: 7 steps; none Has following equipment at home: Single point cane and Walker - 4 wheeled  OCCUPATION: on disability  PLOF: Independent and Independent with basic ADLs  PATIENT GOALS: wants to get back to walking more, decrease pain in neck and lower back   OBJECTIVE:   DIAGNOSTIC  FINDINGS:  N/A  PATIENT SURVEYS:  42% 11/17/21 (therapist assisted due to difficulty reading)  COGNITION:  Overall cognitive status: Within functional limits for tasks assessed     SENSATION:  Light touch: Appears intact   POSTURE:  Large body habitus, fwd head, rounded shoulders, increased lumbar lordosis  PALPATION: TTP to R upper trap, C7 spinous process, bilateral lumbar paraspinals  Cerivcal AROM/PROM  A/PROM A/PROM  11/17/2021  Right rotation 15  Left rotation 62   (Blank rows = not tested)  LE MMT:  MMT Right 11/17/2021 Left 11/17/2021  Hip flexion 2+/5 3+/5  Hip abduction 2+/5 3+/5  Hip adduction 2+/5 3+/5  Hip internal rotation 2+/5 3+/5  Hip external rotation 2+/5 3+/5  Knee flexion 3+/5 4/5  Knee extension 3/5 4/5   (Blank rows = not tested)  LUMBAR SPECIAL TESTS:  Straight leg raise test: Positive  FUNCTIONAL TESTS:  30"STS: 6 reps with UE use  Transfer:  Supine<>Sit - Min A  GAIT: Distance walked: 67ft Assistive device utilized: None Level of assistance: Complete Independence Comments: antalgic gait towards R  [facility] Adult PT Treatment:                                                DATE: 11/17/21 Therapeutic Exercise: Nustep L4 x 5 min  Hooklying PPT, AROM clam , LTR- increased pain in this position Standing march x 1o each Standing hip abduction 5 x 2 each  Bil heel raise x 10  Seated ball squeeze with abdominal draw in x 15 Seated clam x 15 with RTB Scap retract:  x 10 Manual Therapy: N/A Neuromuscular re-ed: N/A Therapeutic Activity: N/A Modalities: N/A Self Care: N/A   Initial  TREATMENT  Therapeutic Exercise: R upper trap stretch x 20" Row x 10 RTB Seated clamshell x 10 RTB Seated ball squeeze x 10 - 5" LTR x 10   PATIENT EDUCATION:  Education details: continue HEP Person educated: Patient Education method: Explanation, Demonstration, and Handouts Education comprehension: verbalized understanding and returned  demonstration   HOME EXERCISE PROGRAM: Access Code: [email or web address] URL: [email or web address] Date: 11/17/2021 Prepared by: Hessie Diener  Exercises Seated Upper Trapezius Stretch - 2 x daily - 7 x weekly - 2-3 reps - 20 seconds hold Standing Shoulder Row with Anchored Resistance - 2 x daily - 7 x weekly - 3 sets - 10 reps - 3 sec hold Seated Hip Abduction with Resistance - 2  x daily - 7 x weekly - 3 sets - 10 reps Seated Hip Adduction Isometrics with Ball - 2 x daily - 7 x weekly - 3 sets - 10 reps - 5 sec hold Supine Lower Trunk Rotation - 2 x daily - 7 x weekly - 2 sets - 10 reps   ASSESSMENT:  CLINICAL IMPRESSION: Patient reports compliance with HEP and soreness at start of session. Increased pain with hooklying so remained seated or standing for session. She was able to complete min reps of standing execises. Began nustep and she tolerated it well. Will continue to progress as able.   REHAB POTENTIAL: Good  CLINICAL DECISION MAKING: Evolving/moderate complexity  EVALUATION COMPLEXITY: Moderate   GOALS: Goals reviewed with patient? No  SHORT TERM GOALS:  STG Name Target Date Goal status  1 Pt will be compliant with initial HEP for improved comfort and carryover Baseline:  12/08/2021 INITIAL  2 Pt will self report neck and lower back pain no greater than 6/10 for improved comfort and functional ability Baseline: 10/10 at worst 12/08/2021 INITIAL   LONG TERM GOALS:   LTG Name Target Date Goal status  1 Pt will improve FOTO function score to no less than predicted as proxy for functional improvement  Baseline: 42 01/12/2022 INITIAL  2 Pt will self report neck and lower back pain no greater than 3/10 for improved comfort and functional ability Baseline: 10/10 at worst 01/12/2022 INITIAL  3 Pt will increase reps in 30" STS to no less than 9 without use of UE for improved balance and proximal hip strength  Baseline: 01/12/2022 INITIAL  4 Pt will improve LE strength  to no less than 4/5 for tested motions in order to improved functional mobility Baseline: MMT Right 11/11/2021 Left 11/11/2021  Hip flexion 2+/5 3+/5  Hip abduction 2+/5 3+/5  Hip adduction 2+/5 3+/5  Hip internal rotation 2+/5 3+/5  Hip external rotation 2+/5 3+/5  Knee flexion 3+/5 4/5  Knee extension 3/5 4/5   01/12/2022 INITIAL   PLAN: PT FREQUENCY: 2x/week  PT DURATION: 8 weeks  PLANNED INTERVENTIONS: Therapeutic exercises, Therapeutic activity, Neuro Muscular re-education, Balance training, Gait training, Patient/Family education, Joint mobilization, Aquatic Therapy, Dry Needling, Electrical stimulation, Cryotherapy, Moist heat, Vasopneumatic device, and Manual therapy  PLAN FOR NEXT SESSION:  assess response to HEP, progress as able   Dorene Ar, PTA 11/17/2021, 2:16 PM

## 2021-11-19 ENCOUNTER — Ambulatory Visit (HOSPITAL_BASED_OUTPATIENT_CLINIC_OR_DEPARTMENT_OTHER): Payer: Medicare Other | Attending: Sports Medicine | Admitting: Physical Therapy

## 2021-11-24 ENCOUNTER — Ambulatory Visit (INDEPENDENT_AMBULATORY_CARE_PROVIDER_SITE_OTHER): Payer: Medicaid Other | Admitting: Podiatry

## 2021-11-24 DIAGNOSIS — Z91199 Patient's noncompliance with other medical treatment and regimen due to unspecified reason: Secondary | ICD-10-CM

## 2021-11-24 NOTE — Progress Notes (Signed)
No show

## 2021-11-25 ENCOUNTER — Ambulatory Visit: Payer: Medicare Other | Attending: Sports Medicine

## 2021-11-27 ENCOUNTER — Telehealth: Payer: Self-pay

## 2021-11-27 ENCOUNTER — Ambulatory Visit: Payer: Medicare Other

## 2021-11-27 NOTE — Telephone Encounter (Signed)
PT called on 11/25/2021 regarding missed appointment on same date.  Explained attendance policy and that she can currently only schedule one visit at a time due to no shows with previous therapy appointments.  Patient verbalized understanding and confirmed next appointment,   Ward Chatters, PT 11/27/21 9:47 AM

## 2021-12-02 ENCOUNTER — Ambulatory Visit (INDEPENDENT_AMBULATORY_CARE_PROVIDER_SITE_OTHER): Payer: Medicare Other | Admitting: Podiatry

## 2021-12-02 ENCOUNTER — Encounter: Payer: Self-pay | Admitting: Podiatry

## 2021-12-02 ENCOUNTER — Ambulatory Visit: Payer: Medicare Other | Admitting: Physical Therapy

## 2021-12-02 ENCOUNTER — Other Ambulatory Visit: Payer: Self-pay

## 2021-12-02 ENCOUNTER — Ambulatory Visit (INDEPENDENT_AMBULATORY_CARE_PROVIDER_SITE_OTHER): Payer: Medicare Other

## 2021-12-02 DIAGNOSIS — M7751 Other enthesopathy of right foot: Secondary | ICD-10-CM

## 2021-12-02 DIAGNOSIS — M7750 Other enthesopathy of unspecified foot: Secondary | ICD-10-CM | POA: Diagnosis not present

## 2021-12-02 DIAGNOSIS — Z969 Presence of functional implant, unspecified: Secondary | ICD-10-CM

## 2021-12-02 DIAGNOSIS — Q688 Other specified congenital musculoskeletal deformities: Secondary | ICD-10-CM

## 2021-12-02 NOTE — Patient Instructions (Signed)
At the pharmacy ask for Voltaren gel. You can apply this to the painful areas multiple times a day, please follow the package directions.

## 2021-12-02 NOTE — Progress Notes (Signed)
°  Subjective:  Patient ID: Danielle Danielle [name], female    DOB: 20-Sep-1971,  MRN: [id number]  Chief Complaint  Patient presents with   Foot Pain    The right ankle I had surgery about 4 to 5 years ago with [facility] and it is swelling on the outside    51 y.o. female presents with the above complaint. History confirmed with patient. Has had swelling and pain for about a month. Endorses history of ankle surgery 4-5 years ago (per records appears to be circa 2013). Has pain in the ankle with pressure. Describes soreness and throbbing.  Objective:  Physical Exam: warm, good capillary refill, no trophic changes or ulcerative lesions, normal DP and PT pulses, and normal sensory exam.  Right Foot: Danielle [name] right ankle at the previous hardware. Soft tissue dell at this are. Danielle [name] peroneal tendons, posterior talus. Mild ankle pain with movement of the FHL.   No images are attached to the encounter.  Radiographs: X-ray of right ankle: well healed fibular fracture, large os trigonum vs old posterior process fracture, unchanged from priors. Assessment:   1. Right ankle tendonitis   2. Os trigonum   3. Presence of retained hardware    Plan:  Patient was evaluated and treated and all questions answered.  Right ankle tendonitis, possible os trigonum or hardware irritation -XR reviewed with patient. Compared to priors. -May need MRI to evaluate the tendinous structures and os trigonum. -Will immobilize in boot for 4 weeks -Recc voltaren OTC. Has gastric hx and on BT so will avoid oral NSAIDs.   Return in about 4 weeks (around 12/30/2021) for Ankle pain f/u.

## 2021-12-05 ENCOUNTER — Telehealth: Payer: Self-pay | Admitting: Podiatry

## 2021-12-05 NOTE — Telephone Encounter (Signed)
Patient called she is having a lot of pain with the boot she is wearing , she would also like to know about proceeding with MRI that Dr March Rummage was going to order?

## 2021-12-07 ENCOUNTER — Other Ambulatory Visit: Payer: Self-pay | Admitting: Podiatry

## 2021-12-07 DIAGNOSIS — M7751 Other enthesopathy of right foot: Secondary | ICD-10-CM

## 2021-12-07 NOTE — Progress Notes (Signed)
PRN RT ankle pain

## 2021-12-07 NOTE — Telephone Encounter (Signed)
Order placed for RT ankle MRI at [facility]. Please notify patient that they should call and schedule an appt.- Dr. Amalia Hailey

## 2021-12-08 ENCOUNTER — Other Ambulatory Visit: Payer: Self-pay

## 2021-12-08 ENCOUNTER — Ambulatory Visit (INDEPENDENT_AMBULATORY_CARE_PROVIDER_SITE_OTHER): Payer: Medicare Other | Admitting: Podiatry

## 2021-12-08 ENCOUNTER — Ambulatory Visit: Payer: Medicare Other

## 2021-12-08 DIAGNOSIS — M7751 Other enthesopathy of right foot: Secondary | ICD-10-CM

## 2021-12-08 DIAGNOSIS — M19071 Primary osteoarthritis, right ankle and foot: Secondary | ICD-10-CM | POA: Diagnosis not present

## 2021-12-08 MED ORDER — BETAMETHASONE SOD PHOS & ACET 6 (3-3) MG/ML IJ SUSP
3.0000 mg | Freq: Once | INTRAMUSCULAR | Status: AC
  Administered 2021-12-08: 3 mg via INTRA_ARTICULAR

## 2021-12-08 NOTE — Progress Notes (Signed)
Subjective:  51 y.o. female morbidly obese on Xarelto presenting today for follow-up evaluation regarding pain and tenderness to the right ankle.  Patient states the ankle pain has been ongoing for several years now.  She reports having ORIF of the right ankle 2013.  She has pain and tenderness when walking and weightbearing.  She denies any recent history of injury.   Past Medical History:  Diagnosis Date   Arthritis    Asthma    Bipolar 1 disorder ([place])    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[place])    crack-cocaine-11/12 last    GERD (gastroesophageal reflux disease)    Hypertension    Migraines    Sleep apnea    states had test-no cpap ordered-does snore   Past Surgical History:  Procedure Laterality Date   BIOPSY  03/08/2020   Procedure: BIOPSY;  Surgeon: [name], MD;  [facility];  Service: Gastroenterology;;   COLONOSCOPY WITH PROPOFOL N/A 03/08/2020   Procedure: COLONOSCOPY WITH PROPOFOL;  Surgeon: [name], MD;  [facility];  Service: Gastroenterology;  Laterality: N/A;   ESOPHAGOGASTRODUODENOSCOPY (EGD) WITH PROPOFOL N/A 03/08/2020   Procedure: ESOPHAGOGASTRODUODENOSCOPY (EGD) WITH PROPOFOL;  Surgeon: [name], MD;  [facility];  Service: Gastroenterology;  Laterality: N/A;   FRACTURE SURGERY     rt femer/knee-plate-screws-   ORIF ANKLE FRACTURE  03/16/2012   Procedure: OPEN REDUCTION INTERNAL FIXATION (ORIF) ANKLE FRACTURE;  Surgeon: Colin Rhein, MD;  Location: [place];  Service: Orthopedics;  Laterality: Right;  right lateral malleolus fracture   Allergies  Allergen Reactions   Percocet [Oxycodone-Acetaminophen] Nausea And Vomiting     Objective / Physical Exam:  General:  The patient is alert and oriented x3 in no acute distress. Dermatology:  Skin is warm, dry and supple bilateral lower extremities. Negative for open lesions or macerations. Vascular:  Palpable pedal pulses bilaterally. No edema or erythema  noted. Capillary refill within normal limits. Neurological:  Epicritic and protective threshold grossly intact bilaterally.  Musculoskeletal Exam:  Pain on palpation to the anterior lateral medial aspects of the patient's right ankle. Mild edema noted. Range of motion within normal limits to all pedal and ankle joints bilateral. Muscle strength 5/5 in all groups bilateral.   Radiographic Exam 12/02/2021 RT ankle: orthopedic screws and hardware noted to the distal fibula right.  Joint spaces are mostly preserved.  Os trigonum noted likely secondary to history of injury   Assessment: 1.  DJD/capsulitis RT ankle 2. H/o ORIF RT ankle 2013  Plan of Care:  1. Patient was evaluated. X-Rays reviewed that were taken 12/02/2021.  MRI RT ankle is pending 2. Injection of 0.5 mL Celestone Soluspan injected in the patient's right ankle. 3.  Ankle brace dispensed.  Weightbearing as tolerated in good supportive sneakers and shoes. 4.  Patient has a history of right knee and hip pain.  No cam boot was dispensed 5.  Patient declined a Medrol Dosepak.  She does not like the way it makes her feel 6.  No NSAIDs prescribed.  Patient on anticoagulants 7.  Return to clinic in 3 weeks to review MRI results and discuss further treatment options   Edrick Kins, DPM [facility]  Dr. Edrick Kins, [address]  [place], [place] [address]                Office [phone]  Fax [phone]

## 2021-12-08 NOTE — Telephone Encounter (Signed)
Spoke with patient this morning - transferred her to GI to schedule .  She said she will follow up with you at todays appointment, she has a few more questions she would like to ask.

## 2021-12-09 ENCOUNTER — Telehealth: Payer: Self-pay

## 2021-12-09 NOTE — Telephone Encounter (Signed)
PT called and spoke with patient regarding no show. Reviewed attendance policy, patient will be discharged at this time.  Ward Chatters, PT 12/09/21 9:41 AM

## 2021-12-10 ENCOUNTER — Ambulatory Visit
Admission: RE | Admit: 2021-12-10 | Discharge: 2021-12-10 | Disposition: A | Discharge status: Home / Self Care | Payer: Medicare Other | Source: Ambulatory Visit | Attending: Podiatry | Admitting: Podiatry

## 2021-12-10 DIAGNOSIS — M7751 Other enthesopathy of right foot: Secondary | ICD-10-CM

## 2021-12-12 ENCOUNTER — Telehealth: Payer: Self-pay | Admitting: Podiatry

## 2021-12-12 NOTE — Telephone Encounter (Signed)
Pt called to see if Dr Amalia Hailey had her mri results. I have scheduled pt to see Dr Amalia Hailey in [place] on 2.28 to go over the results.

## 2021-12-16 ENCOUNTER — Encounter: Payer: Self-pay | Admitting: Podiatry

## 2021-12-16 ENCOUNTER — Ambulatory Visit (INDEPENDENT_AMBULATORY_CARE_PROVIDER_SITE_OTHER): Payer: Medicare Other | Admitting: Podiatry

## 2021-12-16 ENCOUNTER — Other Ambulatory Visit: Payer: Self-pay

## 2021-12-16 DIAGNOSIS — Q688 Other specified congenital musculoskeletal deformities: Secondary | ICD-10-CM

## 2021-12-16 DIAGNOSIS — M7751 Other enthesopathy of right foot: Secondary | ICD-10-CM

## 2021-12-16 DIAGNOSIS — T8484XA Pain due to internal orthopedic prosthetic devices, implants and grafts, initial encounter: Secondary | ICD-10-CM

## 2021-12-16 NOTE — Progress Notes (Signed)
Subjective:  51 y.o. female morbidly obese on Xarelto presenting today for follow-up evaluation regarding pain and tenderness to the right ankle.  Patient states the ankle pain has been ongoing for several years now.  She reports having ORIF of the right ankle 2013.  Patient states that the injection she received last visit only helped temporarily for about 1 day.  She continues to have severe pain and tenderness associated to the right ankle joint.  MRI was completed 12/10/2021 and she would like to review the MRI results and discuss further treatment options.  The patient is very frustrated because multiple conservative modalities have not created any sort of lasting relief of symptoms for the patient.  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    GERD (gastroesophageal reflux disease)    Hypertension    Migraines    Sleep apnea    states had test-no cpap ordered-does snore   Past Surgical History:  Procedure Laterality Date   BIOPSY  03/08/2020   Procedure: BIOPSY;  Surgeon: [name], MD;  Location: [facility];  Service: Gastroenterology;;   COLONOSCOPY WITH PROPOFOL N/A 03/08/2020   Procedure: COLONOSCOPY WITH PROPOFOL;  Surgeon: [name], MD;  Location: [facility];  Service: Gastroenterology;  Laterality: N/A;   ESOPHAGOGASTRODUODENOSCOPY (EGD) WITH PROPOFOL N/A 03/08/2020   Procedure: ESOPHAGOGASTRODUODENOSCOPY (EGD) WITH PROPOFOL;  Surgeon: [name], MD;  Location: [facility];  Service: Gastroenterology;  Laterality: N/A;   FRACTURE SURGERY     rt femer/knee-plate-screws-   ORIF ANKLE FRACTURE  03/16/2012   Procedure: OPEN REDUCTION INTERNAL FIXATION (ORIF) ANKLE FRACTURE;  Surgeon: Colin Rhein, MD;  Location: [facility];  Service: Orthopedics;  Laterality: Right;  right lateral malleolus fracture   Allergies  Allergen Reactions   Percocet  [Oxycodone-Acetaminophen] Nausea And Vomiting     Objective / Physical Exam:  General:  The patient is alert and oriented x3 in no acute distress. Dermatology:  Skin is warm, dry and supple bilateral lower extremities. Negative for open lesions or macerations. Vascular:  Palpable pedal pulses bilaterally. No edema or erythema noted. Capillary refill within normal limits. Neurological:  Epicritic and protective threshold grossly intact bilaterally.  Musculoskeletal Exam:  Pain on palpation to the anterior lateral medial aspects of the patient's right ankle.  There is also pain on on palpation to the posterior aspect of the ankle and with plantarflexion secondary to the large os trigonum to the posterior aspect of the ankle.  Mild edema noted. Range of motion within normal limits to all pedal and ankle joints bilateral. Muscle strength 5/5 in all groups bilateral.   Radiographic Exam 12/02/2021 RT ankle: orthopedic screws and hardware noted to the distal fibula right.  Joint spaces are mostly preserved.  Os trigonum noted likely secondary to history of injury   MR ANKLE RT WO CONTRAST 12/10/2021 IMPRESSION: Mild appearing Achilles tendinosis without tear. Healed distal fibular fracture with fixation hardware in place.  Assessment: 1.  DJD/capsulitis RT ankle 2. H/o ORIF RT ankle 2013 2.  Symptomatic os trigonum right ankle  Plan of Care:  1. Patient was evaluated.  MRI reviewed today.  The patient believes that most of her pain is coming from the hardware and the large portion of bone to the posterior aspect of the ankle.  The injection received last visit only helped temporarily.  She  has now been dealing with this pain for several years.  She would like to pursue surgical intervention at this time since conservative treatment modalities have been unsuccessful 2. Today we discussed the conservative versus surgical management of the presenting pathology. The patient opts for surgical  management. All possible complications and details of the procedure were explained. All patient questions were answered. No guarantees were expressed or implied. 3. Authorization for surgery was initiated today. Surgery will consist of removal of hardware right fibula.  Excision of os trigonum right posterior ankle. 4.  Patient will require medical clearance prior to surgery  5.  Return to clinic 1 week postop   Edrick Kins, DPM [facility]  Dr. Edrick Kins, [address]                                        [place], [place] [address]                Office [phone]  Fax [phone]

## 2021-12-17 ENCOUNTER — Telehealth: Payer: Self-pay

## 2021-12-17 NOTE — Telephone Encounter (Signed)
Received surgery paperwork from the [facility] office. Left a message for Yekaterina to call and schedule surgery with Dr. Amalia Hailey. ?

## 2021-12-17 NOTE — Telephone Encounter (Signed)
DOS 01/02/2022 ? ?REMOVAL HARDWARE RT - 20680 ?TARSAL EXOSTECTOMY RT - 28104 ? ?[employer] EFFECTIVE DATE - 10/19/2021 ? ?PLAN DEDUCTIBLE - $0.00 ?OUT OF POCKET - $8300.00 W/ $7992.98 REMAINING ? ?CO-INSURANCE ?20% / Day OUTPATIENT SURGERY ?20% / [place] ?COPAY ?$0 / Day OUTPATIENT SURGERY ?$0 / [place] ? ?Notification or Prior Authorization is not required for the requested services ? ?Decision ID #:[id number] ?

## 2021-12-24 ENCOUNTER — Ambulatory Visit: Payer: Medicare Other | Admitting: Podiatry

## 2021-12-30 ENCOUNTER — Ambulatory Visit: Payer: Medicare Other | Admitting: Podiatry

## 2021-12-31 ENCOUNTER — Other Ambulatory Visit
Admission: RE | Admit: 2021-12-31 | Discharge: 2021-12-31 | Disposition: A | Discharge status: Home / Self Care | Payer: Medicare Other | Source: Ambulatory Visit | Attending: Podiatry | Admitting: Podiatry

## 2021-12-31 ENCOUNTER — Encounter (HOSPITAL_COMMUNITY): Payer: Self-pay | Admitting: Urgent Care

## 2021-12-31 ENCOUNTER — Inpatient Hospital Stay: Admission: RE | Admit: 2021-12-31 | Payer: Medicare Other | Source: Ambulatory Visit

## 2021-12-31 ENCOUNTER — Other Ambulatory Visit: Payer: Self-pay

## 2021-12-31 VITALS — Ht 64.0 in | Wt 315.0 lb

## 2021-12-31 DIAGNOSIS — I10 Essential (primary) hypertension: Secondary | ICD-10-CM

## 2021-12-31 NOTE — Patient Instructions (Addendum)
?Your procedure is scheduled on: Friday January 02, 2022. ?Report to Day Surgery inside [facility] 2nd floor, stop by admissions desk before getting on elevator. ?To find out your arrival time please call [phone] between 1PM - 3PM on Thursday January 01, 2022. ? ?Remember: Instructions that are not followed completely may result in serious medical risk,  ?up to and including death, or upon the discretion of your surgeon and anesthesiologist your  ?surgery may need to be rescheduled.  ? ?  _X__ 1. Do not eat food or drink fluids after midnight the night before your procedure. ?                No chewing gum or hard candies. ? ?__X__2.  On the morning of surgery brush your teeth with toothpaste and water, you ?               may rinse your mouth with mouthwash if you wish.  Do not swallow any toothpaste or mouthwash. ?   ? _X__ 3.  No Alcohol for 24 hours before or after surgery. ? ? _X__ 4.  Do Not Smoke or use e-cigarettes For 24 Hours Prior to Your Surgery. ?                Do not use any chewable tobacco products for at least 6 hours prior to ?                Surgery. ? ?_X__  5.  Do not use any recreational drugs (marijuana, cocaine, heroin, ecstasy, MDMA or other) ?               For at least one week prior to your surgery.  Combination of these drugs with anesthesia ?               May have life threatening results. ? ?____  6.  Bring all medications with you on the day of surgery if instructed.  ? ?__X__  7.  Notify your doctor if there is any change in your medical condition  ?    (cold, fever, infections). ?    ?Do not wear jewelry, make-up, hairpins, clips or nail polish. ?Do not wear lotions, powders, or perfumes. You may wear deodorant. ?Do not shave 48 hours prior to surgery. Men may shave face and neck. ?Do not bring valuables to the hospital.   ? ?[facility] is not responsible for any belongings or valuables. ? ?Contacts, dentures or bridgework may not be worn into  surgery. ?Leave your suitcase in the car. After surgery it may be brought to your room. ?For patients admitted to the hospital, discharge time is determined by your ?treatment team. ?  ?Patients discharged the day of surgery will not be allowed to drive home.   ?Make arrangements for someone to be with you for the first 24 hours of your ?Same Day Discharge. ? ? ?__X__ Take these medicines the morning of surgery with A SIP OF WATER:  ? ? 1. carbamazepine (TEGRETOL XR) 100 MG  ? 2. famotidine (PEPCID) 10 MG  take 2  ? 3.  ? 4. ? 5. ? 6. ? ?____ Fleet Enema (as directed)  ? ?__X__ Use CHG Soap (or wipes) as directed ? ?____ Use Benzoyl Peroxide Gel as instructed ? ?__X__ Use inhalers on the day of surgery ? albuterol (VENTOLIN HFA) 108 (90 Base) MCG/ACT inhaler ? SYMBICORT 160-4.5 MCG/ACT inhaler ?____ Stop metformin 2 days prior to surgery   ? ?  ____ Take 1/2 of usual insulin dose the night before surgery. No insulin the morning ?         of surgery.  ? ?__X__ Patient not taking  RIVAROXABAN (XARELTO) VTE STARTER PACK (15 & 20 MG ) at this time.  ? ?__X__ One Week prior to surgery- Stop Anti-inflammatories such as Ibuprofen, Aleve, Advil, Motrin, meloxicam (MOBIC), diclofenac, etodolac, ketorolac, Toradol, Daypro, piroxicam, Goody's or BC powders. OK TO USE TYLENOL IF NEEDED ?  ?__X__ Stop supplements until after surgery.   ? ?____ Bring C-Pap to the hospital.  ? ? ?If you have any questions regarding your pre-procedure instructions,  ?Please call Pre-admit Testing at [phone] ?

## 2022-01-01 ENCOUNTER — Other Ambulatory Visit
Admission: RE | Admit: 2022-01-01 | Discharge: 2022-01-01 | Disposition: A | Discharge status: Home / Self Care | Payer: Medicare Other | Source: Ambulatory Visit | Attending: Podiatry | Admitting: Podiatry

## 2022-01-01 ENCOUNTER — Telehealth: Payer: Self-pay | Admitting: Urgent Care

## 2022-01-01 DIAGNOSIS — Z01818 Encounter for other preprocedural examination: Secondary | ICD-10-CM | POA: Insufficient documentation

## 2022-01-01 DIAGNOSIS — I10 Essential (primary) hypertension: Secondary | ICD-10-CM | POA: Diagnosis not present

## 2022-01-01 DIAGNOSIS — D649 Anemia, unspecified: Secondary | ICD-10-CM | POA: Insufficient documentation

## 2022-01-01 DIAGNOSIS — Z6841 Body Mass Index (BMI) 40.0 and over, adult: Secondary | ICD-10-CM | POA: Diagnosis not present

## 2022-01-01 DIAGNOSIS — Z01810 Encounter for preprocedural cardiovascular examination: Secondary | ICD-10-CM | POA: Diagnosis not present

## 2022-01-01 LAB — CBC
HCT: 26.3 % — ABNORMAL LOW (ref 36.0–46.0)
Hemoglobin: 7.7 g/dL — ABNORMAL LOW (ref 12.0–15.0)
MCH: 23.3 pg — ABNORMAL LOW (ref 26.0–34.0)
MCHC: 29.3 g/dL — ABNORMAL LOW (ref 30.0–36.0)
MCV: 79.5 fL — ABNORMAL LOW (ref 80.0–100.0)
Platelets: 337 10*3/uL (ref 150–400)
RBC: 3.31 MIL/uL — ABNORMAL LOW (ref 3.87–5.11)
RDW: 20.8 % — ABNORMAL HIGH (ref 11.5–15.5)
WBC: 5.9 10*3/uL (ref 4.0–10.5)
nRBC: 0 % (ref 0.0–0.2)

## 2022-01-01 LAB — BASIC METABOLIC PANEL
Anion gap: 8 (ref 5–15)
BUN: 11 mg/dL (ref 6–20)
CO2: 30 mmol/L (ref 22–32)
Calcium: 8.4 mg/dL — ABNORMAL LOW (ref 8.9–10.3)
Chloride: 103 mmol/L (ref 98–111)
Creatinine, Ser: 0.78 mg/dL (ref 0.44–1.00)
GFR, Estimated: >60 mL/min (ref >60)
Glucose, Bld: 82 mg/dL (ref 70–99)
Potassium: 3.5 mmol/L (ref 3.5–5.1)
Sodium: 141 mmol/L (ref 135–145)

## 2022-01-01 NOTE — Progress Notes (Addendum)
?  [facility] ?Perioperative Services: Pre-Admission/Anesthesia Testing ? ?Abnormal Lab Notification ?  ?Date: 01/01/22 ? ?Name: Danielle [name] ?MRN:   [id number] ? ?Re: Abnormal labs noted during PAT appointment  ? ?Notified:  ?Provider Name Provider Role Notification Mode  ?Edrick Kins, DPM Podiatry (Surgeon) Routed and/or faxed via [facility]  ?Raelyn Number, PA-C Primary Care Provider Routed and/or faxed via [facility]  ? ?Clinical Information and Notes:  ?ABNORMAL LAB VALUE(S): ?Lab Results  ?Component Value Date  ? HGB 7.7 (L) 01/01/2022  ? HCT 26.3 (L) 01/01/2022  ? MCV 79.5 (L) 01/01/2022  ? MCH 23.3 (L) 01/01/2022  ? ?Danielle [name] is scheduled for a HARDWARE REMOVAL, RIGHT FOOT; EXCISION OS TRIGONUM RIGHT POSTERIOR ANKLE on 01/02/2022.  ? ?Patient has been anemic on labs that have been reviewed dating back to 2013; range has been 8.9 - 12.2 g/dL. Today she is at her lowest at 7.7 g/dL.  ? ?She is on daily oral ferrous sulfate 325 mg; unsure if it is being taken with source of vitamin C to help promote absorption.  ? ?Dietary intake presumed to include adequate sources of iron (meat + green leafy vegetables).  ? ?Last colonoscopy was done on 03/08/2020; results reviewed as normal with no stigmata of bleeding.  ? ?No significant bleeding was reported during PAT interview with RN.  ? ?Plan:  ?Danielle [name] found to be anemic on preoperative lab workup. With that being said, would recommend better preoperative optimization of blood counts prior to proceeding with elective podiatric surgery. Could consider iron infusion vs. PRBC transfusion. Forwarding note to PCP and primary attending surgeon for review, care planning, and ultimately optimization of patient's noted anemia.  ? ?Honor Loh, MSN, APRN, FNP-C, CEN ?[place]  ?Peri-operative Services Nurse Practitioner ?Phone: [phone] ?Fax: [phone] ?01/01/22 12:46 PM ? ?

## 2022-01-01 NOTE — Progress Notes (Signed)
?  Perioperative Services ?Pre-Admission/Anesthesia Testing ? ?  ?Date: 01/01/22 ? ?Name: Danielle [name] ?MRN:   [id number] ? ?Re: Preoperative anemia and plans for surgery ? ?Planned Surgical Procedure(s):  ?HARDWARE REMOVAL, RIGHT FOOT ?EXCISION OS TRIGONUM RIGHT POSTERIOR ANKLE ? ?Clinical Notes:  ?Patient is scheduled for the above procedure on 01/02/2022 with Dr. Daylene Katayama, DPM.  In preparation for patient procedure, patient presented to the PAT clinic on the morning of 01/01/2022 for labs.  Preoperative CBC showed patient with a significant anemia.  Patient with known IDA, however this is as low as it has been since 2013 from the results that I am able to review.  Notification sent to patient's PCP and to patient's primary attending surgeon for review and consideration of further optimization prior to surgery.  Spoke with surgeons office Darrick Penna) regarding result.  Additionally, Dr. Amalia Hailey had requested medical clearance on this patient and this has not been received either at this point. I was advised that DPM was aware and decision has been made to postpone patient's surgery pending further evaluation and optimization by PCP.  ? ?Patient has been removed from the OR schedule for 01/02/2022.  Patient to be contacted by primary attending surgeons office to make her aware that surgery is being postponed pending further medical evaluation/optimization. ? ?Honor Loh, MSN, APRN, FNP-C, CEN ?[place]  ?Peri-operative Services Nurse Practitioner ?Phone: [phone] ?01/01/22 1:51 PM ? ?NOTE: This note has been prepared using [employer]. Despite my best ability to proofread, there is always the potential that unintentional transcriptional errors may still occur from this process. ? ?

## 2022-01-02 ENCOUNTER — Ambulatory Visit: Admission: RE | Admit: 2022-01-02 | Payer: Medicare Other | Source: Home / Self Care | Admitting: Podiatry

## 2022-01-02 ENCOUNTER — Encounter: Admission: RE | Payer: Self-pay | Source: Home / Self Care

## 2022-01-02 ENCOUNTER — Telehealth: Payer: Self-pay | Admitting: Hematology

## 2022-01-02 SURGERY — REMOVAL, HARDWARE
Anesthesia: Choice | Laterality: Right

## 2022-01-02 NOTE — Telephone Encounter (Signed)
Scheduled appt per 3/15 referral. Pt is aware of appt date and time. Pt is aware to arrive 15 mins prior to appt time and to bring and updated insurance card. Pt is aware of appt location.   ?

## 2022-01-05 NOTE — Progress Notes (Signed)
? ?Date:  01/06/2022  ? ?ID:  Danielle [name], DOB [birth year], MRN [id number] ? ?PCP:  Trey Sailors, PA  ?Cardiologist:  Rex Kras, DO, [facility]  (established care 01/06/2022) ? ?REASON FOR CONSULT: Evaluation for hypertensive heart disease ? ?REQUESTING PHYSICIAN:  ?Trey Sailors, PA ?[phone] ?[place],  [place] [address] ? ?Chief Complaint  ?Patient presents with  ? Hypertension  ? New Patient (Initial Visit)  ?  Referred by Ose[facility]ons[facility]  ? Shortness of Breath  ? ? ?HPI  ?Danielle [name] is a 51 y.o. African-American female who presents to the office with a chief complaint of " chest pain and shortness of breath." Patient's past medical history and cardiovascular risk factors include: Hypertension, asthma, prediabetes, GERD, bipolar, cocaine use, obesity due to excess calories.  ? ?She is referred to the office at the request of Trey Sailors, [place] for evaluation of  hypertensive heart disease ? ?Patient is accompanied by her husband Danielle [name] at today's office visit and he also provides collateral history during today's encounter. ? ?Shortness of breath: ?Present for the last several years, progressive, now experiencing orthopnea, paroxysmal nocturnal dyspnea.  She at times ends up sitting upright to go to sleep.  She does not have any prior history of congestive heart failure. ? ?Chest pain: ?Describes it as a tightness like sensation, intensity 5 out of 10, lasting for few seconds, usually present at rest, self-limited, nonexertional.  ? ?Confounding factors for both chest pain and shortness of breath include deconditioning due to underlying obesity, anemia, and cocaine use.  Last use of cocaine was approximately 2 to 3 weeks ago per patient. ? ?Patient was also scheduled to see a sleep provider for evaluation of sleep apnea but this is currently pending.  She is requesting a referral. ? ? ?FUNCTIONAL STATUS: ?No structured exercise program or daily routine.  ? ?ALLERGIES: ?Allergies   ?Allergen Reactions  ? Percocet [Oxycodone-Acetaminophen] Nausea And Vomiting  ? ? ?MEDICATION LIST PRIOR TO VISIT: ?Current Meds  ?Medication Sig  ? albuterol (PROVENTIL) (2.5 MG/3ML) 0.083% nebulizer solution Take 2.5 mg by nebulization every 6 (six) hours as needed for wheezing or shortness of breath.  ? albuterol (VENTOLIN HFA) 108 (90 Base) MCG/ACT inhaler Inhale 2 puffs into the lungs every 6 (six) hours as needed for wheezing or shortness of breath.  ? carbamazepine (TEGRETOL XR) 100 MG 12 hr tablet Take 100 mg by mouth 2 (two) times daily.  ? diclofenac Sodium (VOLTAREN) 1 % GEL Apply 1 application. topically daily as needed (foot pain).  ? famotidine (PEPCID) 10 MG tablet Take 10 mg by mouth daily as needed for heartburn or indigestion.  ? ferrous sulfate 325 (65 FE) MG tablet Take 325 mg by mouth daily with breakfast.  ? furosemide (LASIX) 20 MG tablet Take 20 mg by mouth daily.  ? hydrOXYzine (ATARAX/VISTARIL) 25 MG tablet Take 12.5-25 mg by mouth 3 (three) times daily as needed for anxiety.  ? losartan (COZAAR) 25 MG tablet Take 25 mg by mouth daily.  ? Phenylephrine-APAP-guaiFENesin (TYLENOL SINUS SEVERE) 5-325-200 MG TABS Take 1 tablet by mouth daily as needed (sinus congestion).  ? RIVAROXABAN (XARELTO) VTE STARTER PACK (15 & 20 MG) Follow package directions: Take one '15mg'$  tablet by mouth twice a day. On day 22, switch to one '20mg'$  tablet once a day. Take with food.  ? sertraline (ZOLOFT) 50 MG tablet Take 50 mg by mouth at bedtime.  ? SYMBICORT 160-4.5 MCG/ACT inhaler Inhale 2 puffs  into the lungs in the morning and at bedtime.  ?  ? ?PAST MEDICAL HISTORY: ?Past Medical History:  ?Diagnosis Date  ? Arthritis   ? Asthma   ? Bipolar 1 disorder ([place])   ? Drug abuse in remission [facility])   ? crack-cocaine-11/12 last   ? GERD (gastroesophageal reflux disease)   ? Hypertension   ? Migraines   ? Sleep apnea   ? states had test-no cpap ordered-does snore  ? ? ?PAST SURGICAL HISTORY: ?Past Surgical History:   ?Procedure Laterality Date  ? BIOPSY  03/08/2020  ? Procedure: BIOPSY;  Surgeon: [name], MD;  Location: [facility];  Service: Gastroenterology;;  ? COLONOSCOPY WITH PROPOFOL N/A 03/08/2020  ? Procedure: COLONOSCOPY WITH PROPOFOL;  Surgeon: [name], MD;  [facility];  Service: Gastroenterology;  Laterality: N/A;  ? ESOPHAGOGASTRODUODENOSCOPY (EGD) WITH PROPOFOL N/A 03/08/2020  ? Procedure: ESOPHAGOGASTRODUODENOSCOPY (EGD) WITH PROPOFOL;  Surgeon: [name], MD;  [facility];  Service: Gastroenterology;  Laterality: N/A;  ? FRACTURE SURGERY    ? rt femer/knee-plate-screws-  ? ORIF ANKLE FRACTURE  03/16/2012  ? Procedure: OPEN REDUCTION INTERNAL FIXATION (ORIF) ANKLE FRACTURE;  Surgeon: Colin Rhein, MD;  Location: [facility];  Service: Orthopedics;  Laterality: Right;  right lateral malleolus fracture  ? ? ?FAMILY HISTORY: ?The patient family history includes Asthma in her sister; Cancer in her father; Eczema in her sister; High blood pressure in her father; SIDS in her son. ? ?SOCIAL HISTORY:  ?The patient  reports that she has never smoked. She has never used smokeless tobacco. She reports that she does not currently use drugs after having used the following drugs: "Crack" cocaine and Cocaine. She reports that she does not drink alcohol. ? ?REVIEW OF SYSTEMS: ?Review of Systems  ?Cardiovascular:  Positive for dyspnea on exertion, orthopnea and paroxysmal nocturnal dyspnea. Negative for chest pain, leg swelling, palpitations and syncope.  ?Respiratory:  Positive for shortness of breath.   ?Gastrointestinal:  Positive for hemorrhoids.  ?Genitourinary:   ?     Heavy menstrual cycles at times.   ? ?PHYSICAL EXAM: ?Vitals with BMI 01/06/2022 01/06/2022 12/31/2021  ?Height - '5\' 4"'$  '5\' 4"'$   ?Weight - 318 lbs 3 oz 315 lbs  ?BMI - 54.59 54.04  ?Systolic 697 948 -  ?Diastolic 94 90 -  ?Pulse 58 71 -  ? ? ?CONSTITUTIONAL: Appears older than stated age, disheveled, hemodynamically  stable, no acute distress.  ?SKIN: Skin is warm and dry. No rash noted. No cyanosis. No pallor. No jaundice ?HEAD: Normocephalic and atraumatic.  ?EYES: No scleral icterus ?MOUTH/THROAT: Moist oral membranes.  ?NECK: No JVD present. No thyromegaly noted. No carotid bruits  ?LYMPHATIC: No visible cervical adenopathy.  ?CHEST Normal respiratory effort. No intercostal retractions  ?LUNGS: Clear to auscultation bilaterally.  No stridor. No wheezes. No rales.  ?CARDIOVASCULAR: Regular rate and rhythm, positive S1-S2, no murmurs rubs or gallops appreciated. ?ABDOMINAL: Obese, soft, nontender, nondistended, positive bowel sounds in all 4 quadrants, no apparent ascites.  ?EXTREMITIES: No peripheral edema, warm to touch ?HEMATOLOGIC: No significant bruising ?NEUROLOGIC: Oriented to person, place, and time. Nonfocal. Normal muscle tone.  ?PSYCHIATRIC: Normal mood and affect. Normal behavior. Cooperative ? ?CARDIAC DATABASE: ?EKG: ?01/06/2022: Sinus  Rhythm, 64bpm, normal axis, without underlying injury pattern. ? ?Echocardiogram: ?01/19/2020: ?1. Left ventricular ejection fraction, by estimation, is 60 to 65%. The left ventricle has normal ?function. The left ventricle has no regional wall motion abnormalities. Left ventricular ?diastolic parameters were normal. ?2. Right  ventricular systolic function is normal. The right ventricular size is normal. Tricuspid ?regurgitation signal is inadequate for assessing PA pressure. ?3. The mitral valve is normal in structure. No evidence of mitral valve regurgitation. No ?evidence of mitral stenosis. ?4. The aortic valve is normal in structure. Aortic valve regurgitation is not visualized. No aortic ?stenosis is present. ?5. The inferior vena cava is normal in size with greater than 50% respiratory variability, ?suggesting right atrial pressure of 3 mmHg. ?  ? ?Stress Testing: ?No results found for this or any previous visit from the past 1095 days. ? ? ?Heart  Catheterization: ?None ? ?LABORATORY DATA: ?CBC Latest Ref Rng & Units 01/01/2022 08/29/2020 08/15/2018  ?WBC 4.0 - 10.5 K/uL 5.9 6.5 6.3  ?Hemoglobin 12.0 - 15.0 g/dL 7.7(L) 8.9(L) 9.6(L)  ?Hematocrit 36.0 - 46.0 % 26.3(L) 28.4(L) 31

## 2022-01-06 ENCOUNTER — Other Ambulatory Visit: Payer: Self-pay

## 2022-01-06 ENCOUNTER — Ambulatory Visit: Payer: Medicare Other | Admitting: Cardiology

## 2022-01-06 ENCOUNTER — Encounter: Payer: Self-pay | Admitting: Cardiology

## 2022-01-06 VITALS — BP 147/94 | HR 58 | Temp 96.4°F | Resp 16 | Ht 64.0 in | Wt 318.2 lb

## 2022-01-06 DIAGNOSIS — F1490 Cocaine use, unspecified, uncomplicated: Secondary | ICD-10-CM

## 2022-01-06 DIAGNOSIS — I10 Essential (primary) hypertension: Secondary | ICD-10-CM

## 2022-01-06 DIAGNOSIS — F319 Bipolar disorder, unspecified: Secondary | ICD-10-CM

## 2022-01-06 DIAGNOSIS — D509 Iron deficiency anemia, unspecified: Secondary | ICD-10-CM

## 2022-01-06 DIAGNOSIS — R072 Precordial pain: Secondary | ICD-10-CM

## 2022-01-06 DIAGNOSIS — R0602 Shortness of breath: Secondary | ICD-10-CM

## 2022-01-09 ENCOUNTER — Other Ambulatory Visit: Payer: Self-pay | Admitting: Cardiology

## 2022-01-09 ENCOUNTER — Encounter: Payer: Medicare Other | Admitting: Podiatry

## 2022-01-10 LAB — CMP14+EGFR
ALT: 9 IU/L (ref 0–32)
AST: 13 IU/L (ref 0–40)
Albumin/Globulin Ratio: 1.1 — ABNORMAL LOW (ref 1.2–2.2)
Albumin: 4.1 g/dL (ref 3.8–4.9)
Alkaline Phosphatase: 97 IU/L (ref 44–121)
BUN/Creatinine Ratio: 15 (ref 9–23)
BUN: 13 mg/dL (ref 6–24)
Bilirubin Total: 0.2 mg/dL (ref 0.0–1.2)
CO2: 21 mmol/L (ref 20–29)
Calcium: 8.9 mg/dL (ref 8.7–10.2)
Chloride: 103 mmol/L (ref 96–106)
Creatinine, Ser: 0.86 mg/dL (ref 0.57–1.00)
Globulin, Total: 3.9 g/dL (ref 1.5–4.5)
Glucose: 71 mg/dL (ref 70–99)
Potassium: 4.7 mmol/L (ref 3.5–5.2)
Sodium: 138 mmol/L (ref 134–144)
Total Protein: 8 g/dL (ref 6.0–8.5)
eGFR: 81 mL/min/{1.73_m2} (ref >59)

## 2022-01-10 LAB — MAGNESIUM: Magnesium: 2.2 mg/dL (ref 1.6–2.3)

## 2022-01-10 LAB — LIPID PANEL WITH LDL/HDL RATIO
Cholesterol, Total: 175 mg/dL (ref 100–199)
HDL: 61 mg/dL (ref >39)
LDL Chol Calc (NIH): 104 mg/dL — ABNORMAL HIGH (ref 0–99)
LDL/HDL Ratio: 1.7 ratio (ref 0.0–3.2)
Triglycerides: 48 mg/dL (ref 0–149)
VLDL Cholesterol Cal: 10 mg/dL (ref 5–40)

## 2022-01-10 LAB — PRO B NATRIURETIC PEPTIDE: NT-Pro BNP: 46 pg/mL (ref 0–249)

## 2022-01-10 LAB — LDL CHOLESTEROL, DIRECT: LDL Direct: 114 mg/dL — ABNORMAL HIGH (ref 0–99)

## 2022-01-10 LAB — HEMOGLOBIN AND HEMATOCRIT, BLOOD
Hematocrit: 26.1 % — ABNORMAL LOW (ref 34.0–46.6)
Hemoglobin: 7.9 g/dL — ABNORMAL LOW (ref 11.1–15.9)

## 2022-01-13 ENCOUNTER — Ambulatory Visit: Payer: Medicare Other

## 2022-01-13 ENCOUNTER — Other Ambulatory Visit: Payer: Self-pay

## 2022-01-13 DIAGNOSIS — R0602 Shortness of breath: Secondary | ICD-10-CM

## 2022-01-13 DIAGNOSIS — R072 Precordial pain: Secondary | ICD-10-CM

## 2022-01-15 ENCOUNTER — Inpatient Hospital Stay: Payer: Medicare Other | Attending: Hematology | Admitting: Hematology

## 2022-01-15 ENCOUNTER — Inpatient Hospital Stay: Payer: Medicare Other

## 2022-01-15 ENCOUNTER — Other Ambulatory Visit: Payer: Self-pay

## 2022-01-15 VITALS — BP 128/84 | HR 74 | Temp 97.2°F | Resp 18 | Wt 313.2 lb

## 2022-01-15 DIAGNOSIS — N920 Excessive and frequent menstruation with regular cycle: Secondary | ICD-10-CM | POA: Insufficient documentation

## 2022-01-15 DIAGNOSIS — Z79899 Other long term (current) drug therapy: Secondary | ICD-10-CM | POA: Diagnosis not present

## 2022-01-15 DIAGNOSIS — Z809 Family history of malignant neoplasm, unspecified: Secondary | ICD-10-CM

## 2022-01-15 DIAGNOSIS — N921 Excessive and frequent menstruation with irregular cycle: Secondary | ICD-10-CM

## 2022-01-15 DIAGNOSIS — D509 Iron deficiency anemia, unspecified: Secondary | ICD-10-CM

## 2022-01-15 DIAGNOSIS — D500 Iron deficiency anemia secondary to blood loss (chronic): Secondary | ICD-10-CM

## 2022-01-15 DIAGNOSIS — F5089 Other specified eating disorder: Secondary | ICD-10-CM | POA: Diagnosis not present

## 2022-01-15 LAB — CBC WITH DIFFERENTIAL/PLATELET
Abs Immature Granulocytes: 0.01 10*3/uL (ref 0.00–0.07)
Basophils Absolute: 0.1 10*3/uL (ref 0.0–0.1)
Basophils Relative: 1 %
Eosinophils Absolute: 0.1 10*3/uL (ref 0.0–0.5)
Eosinophils Relative: 2 %
HCT: 25.4 % — ABNORMAL LOW (ref 36.0–46.0)
Hemoglobin: 7.6 g/dL — ABNORMAL LOW (ref 12.0–15.0)
Immature Granulocytes: 0 %
Lymphocytes Relative: 50 %
Lymphs Abs: 2.9 10*3/uL (ref 0.7–4.0)
MCH: 22.9 pg — ABNORMAL LOW (ref 26.0–34.0)
MCHC: 29.9 g/dL — ABNORMAL LOW (ref 30.0–36.0)
MCV: 76.5 fL — ABNORMAL LOW (ref 80.0–100.0)
Monocytes Absolute: 0.5 10*3/uL (ref 0.1–1.0)
Monocytes Relative: 8 %
Neutro Abs: 2.2 10*3/uL (ref 1.7–7.7)
Neutrophils Relative %: 39 %
Platelets: 406 10*3/uL — ABNORMAL HIGH (ref 150–400)
RBC: 3.32 MIL/uL — ABNORMAL LOW (ref 3.87–5.11)
RDW: 19.1 % — ABNORMAL HIGH (ref 11.5–15.5)
WBC: 5.7 10*3/uL (ref 4.0–10.5)
nRBC: 0 % (ref 0.0–0.2)

## 2022-01-15 LAB — VITAMIN B12: Vitamin B-12: 116 pg/mL — ABNORMAL LOW (ref 180–914)

## 2022-01-15 LAB — CMP (CANCER CENTER ONLY)
ALT: 7 U/L (ref 0–44)
AST: 13 U/L — ABNORMAL LOW (ref 15–41)
Albumin: 3.7 g/dL (ref 3.5–5.0)
Alkaline Phosphatase: 77 U/L (ref 38–126)
Anion gap: 6 (ref 5–15)
BUN: 13 mg/dL (ref 6–20)
CO2: 28 mmol/L (ref 22–32)
Calcium: 8.7 mg/dL — ABNORMAL LOW (ref 8.9–10.3)
Chloride: 106 mmol/L (ref 98–111)
Creatinine: 0.8 mg/dL (ref 0.44–1.00)
GFR, Estimated: >60 mL/min (ref >60)
Glucose, Bld: 93 mg/dL (ref 70–99)
Potassium: 4.1 mmol/L (ref 3.5–5.1)
Sodium: 140 mmol/L (ref 135–145)
Total Bilirubin: 0.3 mg/dL (ref 0.3–1.2)
Total Protein: 7.9 g/dL (ref 6.5–8.1)

## 2022-01-15 LAB — SAMPLE TO BLOOD BANK

## 2022-01-15 LAB — IRON AND IRON BINDING CAPACITY (CC-WL,HP ONLY)
Iron: 38 ug/dL (ref 28–170)
Saturation Ratios: 7 % — ABNORMAL LOW (ref 10.4–31.8)
TIBC: 542 ug/dL — ABNORMAL HIGH (ref 250–450)
UIBC: 504 ug/dL — ABNORMAL HIGH (ref 148–442)

## 2022-01-15 LAB — FERRITIN: Ferritin: 6 ng/mL — ABNORMAL LOW (ref 11–307)

## 2022-01-15 NOTE — Progress Notes (Addendum)
? ? ?HEMATOLOGY/ONCOLOGY CONSULTATION NOTE ? ?Date of Service: 01/15/2022 ? ?Patient Care Team: ?Trey Sailors, PA as PCP - General (Physician Assistant) ? ?CHIEF COMPLAINTS/PURPOSE OF CONSULTATION:  ?Evaluation and management of microcytic anemia caused by iron deficiency ? ?HISTORY OF PRESENTING ILLNESS:  ? ?Danielle [name] is a wonderful 51 y.o. female who has been referred to [place] by Dr. Benito Mccreedy, MD for evaluation and management of microcytic anemia caused by iron deficiency. She reports She is doing well with no new symptoms or concerns. ? ?She reports symptoms of PICA such as eating ice and potatoes. She notes having headaches and light headedness that is relieved by the consumption of ice. ? ?She notes that she still has regular menstruation and her period flow has been heavier and more dark for the last few months. She describes the flow as inconsistent month by month.  ? ?She notes that her stools tend to be a red color.  ? ?She reports no history of hemorrhoids. ? ?She notes that she has been encouraged to eat more red meat and she has been increasing her consumption of steak.  ? ?She reports that she was first told she was anemic when she was younger and she was prescribed some pills that she does not recall the name of.  ? ?She says that she has been on her acid reflux medication Protonix for more than 5 years. We discussed that the Protonix could be inhibiting iron absorption and she will not stop the Protonix at this time to due to Helicobacter Pylori related. ? ?No alcohol consumption. ?No smoking. ? ?She reports preciously using cocaine but says that she has stopped usage for the past month. ? ?She reports some recent weight gain. ? ?She notes some upper back pain. ? ?She notes some upper abdominal pain. ? ?She reports some pain in lower extremities. ? ?Labs done 11/11/2021 discussed with pt in detail.  ?Iron saturation is 6%. ? ?Labs done 01/01/2022 discussed with pt in  detail. ?CBC shows reduced hemoglobin at 7.7, HCT 26.3, and increased RDW at 20.8%. ?CMP unremarkable. ? ?MEDICAL HISTORY:  ?Past Medical History:  ?Diagnosis Date  ? Arthritis   ? Asthma   ? Bipolar 1 disorder ([place])   ? Drug abuse in remission [facility])   ? crack-cocaine-11/12 last   ? GERD (gastroesophageal reflux disease)   ? Hypertension   ? Migraines   ? Sleep apnea   ? states had test-no cpap ordered-does snore  ? ? ?SURGICAL HISTORY: ?Past Surgical History:  ?Procedure Laterality Date  ? BIOPSY  03/08/2020  ? Procedure: BIOPSY;  Surgeon: [name], MD;  Location: [facility];  Service: Gastroenterology;;  ? COLONOSCOPY WITH PROPOFOL N/A 03/08/2020  ? Procedure: COLONOSCOPY WITH PROPOFOL;  Surgeon: [name], MD;  Location: [facility];  Service: Gastroenterology;  Laterality: N/A;  ? ESOPHAGOGASTRODUODENOSCOPY (EGD) WITH PROPOFOL N/A 03/08/2020  ? Procedure: ESOPHAGOGASTRODUODENOSCOPY (EGD) WITH PROPOFOL;  Surgeon: [name], MD;  Location: [facility];  Service: Gastroenterology;  Laterality: N/A;  ? FRACTURE SURGERY    ? rt femer/knee-plate-screws-  ? ORIF ANKLE FRACTURE  03/16/2012  ? Procedure: OPEN REDUCTION INTERNAL FIXATION (ORIF) ANKLE FRACTURE;  Surgeon: Colin Rhein, MD;  Location: [facility];  Service: Orthopedics;  Laterality: Right;  right lateral malleolus fracture  ? ? ?SOCIAL HISTORY: ?Social History  ? ?Socioeconomic History  ? Marital status: Married  ?  Spouse name: Not on file  ? Number of children: 4  ? Years of  education: Not on file  ? Highest education level: Not on file  ?Occupational History  ? Not on file  ?Tobacco Use  ? Smoking status: Never  ? Smokeless tobacco: Never  ?Vaping Use  ? Vaping Use: Never used  ?Substance and Sexual Activity  ? Alcohol use: No  ?  Comment: occasionally  ? Drug use: Not Currently  ?  Types: "Crack" cocaine, Cocaine  ?  Comment: remission 11/12  ? Sexual activity: Not on file  ?Other Topics Concern  ? Not on file  ?Social History  Narrative  ? Not on file  ? ?Social Determinants of Health  ? ?Financial Resource Strain: Not on file  ?Food Insecurity: Not on file  ?Transportation Needs: Not on file  ?Physical Activity: Not on file  ?Stress: Not on file  ?Social Connections: Not on file  ?Intimate Partner Violence: Not on file  ? ? ?FAMILY HISTORY: ?Family History  ?Problem Relation Age of Onset  ? Cancer Father   ? High blood pressure Father   ? Asthma Sister   ? Eczema Sister   ? SIDS Son   ? Allergic rhinitis Neg Hx   ? Urticaria Neg Hx   ? ? ?ALLERGIES:  is allergic to percocet [oxycodone-acetaminophen]. ? ?MEDICATIONS:  ?Current Outpatient Medications  ?Medication Sig Dispense Refill  ? acetaminophen (TYLENOL) 650 MG CR tablet Take 650 mg by mouth every 8 (eight) hours as needed for pain. (Patient not taking: Reported on 01/06/2022)    ? albuterol (PROVENTIL) (2.5 MG/3ML) 0.083% nebulizer solution Take 2.5 mg by nebulization every 6 (six) hours as needed for wheezing or shortness of breath.    ? albuterol (VENTOLIN HFA) 108 (90 Base) MCG/ACT inhaler Inhale 2 puffs into the lungs every 6 (six) hours as needed for wheezing or shortness of breath.    ? carbamazepine (TEGRETOL XR) 100 MG 12 hr tablet Take 100 mg by mouth 2 (two) times daily.    ? diclofenac Sodium (VOLTAREN) 1 % GEL Apply 1 application. topically daily as needed (foot pain).    ? famotidine (PEPCID) 10 MG tablet Take 10 mg by mouth daily as needed for heartburn or indigestion.    ? ferrous sulfate 325 (65 FE) MG tablet Take 325 mg by mouth daily with breakfast.    ? furosemide (LASIX) 20 MG tablet Take 20 mg by mouth daily.    ? gabapentin (NEURONTIN) 300 MG capsule Take 1 capsule by mouth daily.    ? hydrOXYzine (ATARAX/VISTARIL) 25 MG tablet Take 12.5-25 mg by mouth 3 (three) times daily as needed for anxiety.    ? losartan (COZAAR) 25 MG tablet Take 25 mg by mouth daily.    ? omeprazole (PRILOSEC) 40 MG capsule Take 40 mg by mouth daily.    ? Phenylephrine-APAP-guaiFENesin  (TYLENOL SINUS SEVERE) 5-325-200 MG TABS Take 1 tablet by mouth daily as needed (sinus congestion).    ? RIVAROXABAN (XARELTO) VTE STARTER PACK (15 & 20 MG) Follow package directions: Take one '15mg'$  tablet by mouth twice a day. On day 22, switch to one '20mg'$  tablet once a day. Take with food. 51 each 0  ? sertraline (ZOLOFT) 50 MG tablet Take 50 mg by mouth at bedtime.    ? SYMBICORT 160-4.5 MCG/ACT inhaler Inhale 2 puffs into the lungs in the morning and at bedtime.    ? ?No current facility-administered medications for this visit.  ? ? ?REVIEW OF SYSTEMS:   ? ?10 Point review of Systems was done is negative  except as noted above. ? ?PHYSICAL EXAMINATION: ?ECOG PERFORMANCE STATUS: 1 - Symptomatic but completely ambulatory ? ?. ?Vitals:  ? 01/15/22 1133  ?BP: 128/84  ?Pulse: 74  ?Resp: 18  ?Temp: (!) 97.2 ?F (36.2 ?C)  ?SpO2: 99%  ? ?There were no vitals filed for this visit. ?.There is no height or weight on file to calculate BMI. ?NAD ?GENERAL:alert, in no acute distress and comfortable ?SKIN: no acute rashes, no significant lesions ?EYES: conjunctiva are pink and non-injected, sclera anicteric ?NECK: supple, no JVD ?LYMPH:  no palpable lymphadenopathy in the cervical, axillary or inguinal regions ?LUNGS: clear to auscultation b/l with normal respiratory effort ?HEART: regular rate & rhythm ?ABDOMEN:  normoactive bowel sounds , non tender, not distended. ?Extremity: no pedal edema ?PSYCH: alert & oriented x 3 with fluent speech ?NEURO: no focal motor/sensory deficits ? ?LABORATORY DATA:  ?I have reviewed the data as listed ? ?. ? ?  Latest Ref Rng & Units 01/09/2022  ?  9:09 AM 01/01/2022  ?  9:01 AM 08/29/2020  ? 11:20 AM  ?CBC  ?WBC 4.0 - 10.5 K/uL  5.9   6.5    ?Hemoglobin 11.1 - 15.9 g/dL 7.9   7.7   8.9    ?Hematocrit 34.0 - 46.6 % 26.1   26.3   28.4    ?Platelets 150 - 400 K/uL  337   426    ? ? ?. ? ?  Latest Ref Rng & Units 01/09/2022  ?  9:09 AM 01/01/2022  ?  9:01 AM 08/29/2020  ? 11:20 AM  ?CMP  ?Glucose 70 -  99 mg/dL 71   82   92    ?BUN 6 - 24 mg/dL '13   11   9    '$ ?Creatinine 0.57 - 1.00 mg/dL 0.86   0.78   1.10    ?Sodium 134 - 144 mmol/L 138   141   142    ?Potassium 3.5 - 5.2 mmol/L 4.7   3.5   4.3    ?Chloride 96 -

## 2022-01-16 ENCOUNTER — Other Ambulatory Visit: Payer: Medicare Other

## 2022-01-16 ENCOUNTER — Encounter: Payer: Medicare Other | Admitting: Podiatry

## 2022-01-16 LAB — FOLATE RBC
Folate, Hemolysate: 260 ng/mL
Folate, RBC: 967 ng/mL (ref >498)
Hematocrit: 26.9 % — ABNORMAL LOW (ref 34.0–46.6)

## 2022-01-19 ENCOUNTER — Other Ambulatory Visit: Payer: Self-pay

## 2022-01-19 ENCOUNTER — Other Ambulatory Visit: Payer: Self-pay | Admitting: Hematology

## 2022-01-19 DIAGNOSIS — D509 Iron deficiency anemia, unspecified: Secondary | ICD-10-CM | POA: Insufficient documentation

## 2022-01-19 DIAGNOSIS — R0602 Shortness of breath: Secondary | ICD-10-CM

## 2022-01-19 DIAGNOSIS — D500 Iron deficiency anemia secondary to blood loss (chronic): Secondary | ICD-10-CM

## 2022-01-21 ENCOUNTER — Inpatient Hospital Stay: Payer: Medicare Other | Attending: Hematology

## 2022-01-21 DIAGNOSIS — D509 Iron deficiency anemia, unspecified: Secondary | ICD-10-CM | POA: Insufficient documentation

## 2022-01-21 NOTE — Progress Notes (Signed)
Tried calling patient no answer left vm

## 2022-01-22 ENCOUNTER — Encounter: Payer: Self-pay | Admitting: Hematology

## 2022-01-22 ENCOUNTER — Ambulatory Visit: Payer: Medicare Other | Admitting: Orthopedic Surgery

## 2022-01-26 ENCOUNTER — Encounter: Payer: Self-pay | Admitting: *Deleted

## 2022-01-26 NOTE — Progress Notes (Addendum)
Scheduling message sent to schedule for 1 additional Venofer, as she no showed first of 3 doses. ?

## 2022-01-27 ENCOUNTER — Encounter: Payer: Self-pay | Admitting: Orthopedic Surgery

## 2022-01-27 ENCOUNTER — Telehealth: Payer: Self-pay | Admitting: Hematology

## 2022-01-27 ENCOUNTER — Ambulatory Visit (INDEPENDENT_AMBULATORY_CARE_PROVIDER_SITE_OTHER): Payer: Medicare Other | Admitting: Orthopedic Surgery

## 2022-01-27 ENCOUNTER — Ambulatory Visit (INDEPENDENT_AMBULATORY_CARE_PROVIDER_SITE_OTHER): Payer: Medicare Other

## 2022-01-27 VITALS — BP 130/83 | HR 80 | Ht 64.0 in | Wt 314.0 lb

## 2022-01-27 DIAGNOSIS — M79642 Pain in left hand: Secondary | ICD-10-CM

## 2022-01-27 NOTE — Progress Notes (Signed)
? ?Office Visit Note ?  ?Patient: ARLYCE CIRCLE           ?Date of Birth: Mar 16, 1971           ?MRN: [id number] ?Visit Date: 01/27/2022 ?             ?Requested by: Trey Sailors, PA ?[address] ?[place],  [place] [address] ?PCP: Trey Sailors, PA ? ? ?Assessment & Plan: ?Visit Diagnoses:  ?1. Pain of left hand   ? ? ?Plan: Discussed with patient that she does have history and exam findings that could be consistent with carpal tunnel syndrome.  We also discussed that she likely ruptured her FDP tendon to the middle finger at some point.  I do not think that she would be a good candidate for an FDP reconstruction.  She otherwise is able to make a full fist.  We will get an electrodiagnostic study for potential carpal tunnel syndrome.  I can see her back once the study is complete.   ? ?Follow-Up Instructions: No follow-ups on file.  ? ?Orders:  ?Orders Placed This Encounter  ?Procedures  ? XR Hand Complete Left  ? ?No orders of the defined types were placed in this encounter. ? ? ? ? Procedures: ?No procedures performed ? ? ?Clinical Data: ?No additional findings. ? ? ?Subjective: ?Chief Complaint  ?Patient presents with  ? Left Hand - Pain  ?  Right handed, pain: 12/10, +swelling over 3rd knuckle, states that she may have bumped it on something, onset x 2 months ago  ? ? ?This is a 51 year old right-hand-dominant female who presents with burning pain in her left hand and pain along the middle finger.  Her symptoms been going on for about 2 months now.  She describes numbness and tingling in her hand and a burning sensation at night.  This wakes her up several nights per week.  She says she has minimal sensation in her middle finger.  She also describes pain at the MP joint of the middle finger.  She is unsure if her pain is worse at the volar side or dorsal side.  She has a difficult time fully describing her symptoms.  She also has no active range of motion at the middle finger DIP joint.  This  has been present for at least 15 years.  She cannot recall any injury to this middle finger.  She has no pain at the PIP or DIP joints of this finger. ? ? ?Review of Systems ? ? ?Objective: ?Vital Signs: BP 130/83 (BP Location: Left Arm, Patient Position: Sitting)   Pulse 80   Ht '5\' 4"'$  (1.626 m)   Wt (!) 314 lb (142.4 kg)   LMP 12/31/2021 (Exact Date)   BMI 53.90 kg/m?  ? ?Physical Exam ?Constitutional:   ?   Appearance: Normal appearance.  ?Cardiovascular:  ?   Rate and Rhythm: Normal rate.  ?   Pulses: Normal pulses.  ?Pulmonary:  ?   Effort: Pulmonary effort is normal.  ?Skin: ?   General: Skin is warm and dry.  ?   Capillary Refill: Capillary refill takes less than 2 seconds.  ?Neurological:  ?   Mental Status: She is alert.  ? ? ?Left Hand Exam  ? ?Tenderness  ?Left hand tenderness location: TTP at dorsal and volar aspect of middle finger MP joints w/ minimal dorsal swelling.  ? ?Other  ?Erythema: absent ?Sensation: decreased ?Pulse: present ? ?Comments:  Full AROM at middle finger  MP and PIP joints.  No active flexion at middle finger DIP joint.  Subtle palpable nodule at middle finger A1 pulley.  Questionable Tinel and Phalen signs.  Patient notes "funny feeling" in middle finger but unable to fully describe. Unreliable 2PD exam but patient less certain during exam of middle finger.  ? ? ? ? ?Specialty Comments:  ?No specialty comments available. ? ?Imaging: ?No results found. ? ? ?PMFS History: ?Patient Active Problem List  ? Diagnosis Date Noted  ? Iron deficiency anemia due to chronic blood loss 01/19/2022  ? Pruritus 08/15/2020  ? Asthma, not well controlled 08/15/2020  ? Heartburn 08/15/2020  ? Closed nondisplaced fracture of neck of fifth metacarpal bone of right hand with routine healing 12/13/2015  ? HYPERTENSION 11/12/2010  ? OBESITY 11/12/2010  ? COCAINE DEPENDENCE, IN REMISSION 11/12/2010  ? DEPRESSION 11/12/2010  ? GERD 11/12/2010  ? BACK PAIN 11/12/2010  ? ?Past Medical History:  ?Diagnosis  Date  ? Arthritis   ? Asthma   ? Bipolar 1 disorder ([place])   ? Drug abuse in remission [facility])   ? crack-cocaine-11/12 last   ? GERD (gastroesophageal reflux disease)   ? Hypertension   ? Migraines   ? Sleep apnea   ? states had test-no cpap ordered-does snore  ?  ?Family History  ?Problem Relation Age of Onset  ? Cancer Father   ? High blood pressure Father   ? Asthma Sister   ? Eczema Sister   ? SIDS Son   ? Allergic rhinitis Neg Hx   ? Urticaria Neg Hx   ?  ?Past Surgical History:  ?Procedure Laterality Date  ? BIOPSY  03/08/2020  ? Procedure: BIOPSY;  Surgeon: [name], MD;  Location: [facility];  Service: Gastroenterology;;  ? COLONOSCOPY WITH PROPOFOL N/A 03/08/2020  ? Procedure: COLONOSCOPY WITH PROPOFOL;  Surgeon: [name], MD;  [facility]: [facility];  Service: Gastroenterology;  Laterality: N/A;  ? ESOPHAGOGASTRODUODENOSCOPY (EGD) WITH PROPOFOL N/A 03/08/2020  ? Procedure: ESOPHAGOGASTRODUODENOSCOPY (EGD) WITH PROPOFOL;  Surgeon: [name], MD;  [facility];  Service: Gastroenterology;  Laterality: N/A;  ? FRACTURE SURGERY    ? rt femer/knee-plate-screws-  ? ORIF ANKLE FRACTURE  03/16/2012  ? Procedure: OPEN REDUCTION INTERNAL FIXATION (ORIF) ANKLE FRACTURE;  Surgeon: Colin Rhein, MD;  Location: [facility];  Service: Orthopedics;  Laterality: Right;  right lateral malleolus fracture  ? ?Social History  ? ?Occupational History  ? Not on file  ?Tobacco Use  ? Smoking status: Never  ? Smokeless tobacco: Never  ?Vaping Use  ? Vaping Use: Never used  ?Substance and Sexual Activity  ? Alcohol use: No  ?  Comment: occasionally  ? Drug use: Not Currently  ?  Types: "Crack" cocaine, Cocaine  ?  Comment: remission 11/12  ? Sexual activity: Not on file  ? ? ? ? ? ? ?

## 2022-01-27 NOTE — Telephone Encounter (Signed)
Per 4/11 inbasket.  Called and left message with the added injections.  Left call back number if changes are needed. ?

## 2022-01-28 ENCOUNTER — Other Ambulatory Visit: Payer: Self-pay

## 2022-01-28 ENCOUNTER — Inpatient Hospital Stay: Payer: Medicare Other

## 2022-01-28 VITALS — BP 143/83 | HR 78 | Temp 97.6°F | Resp 18

## 2022-01-28 DIAGNOSIS — D509 Iron deficiency anemia, unspecified: Secondary | ICD-10-CM | POA: Diagnosis present

## 2022-01-28 DIAGNOSIS — D500 Iron deficiency anemia secondary to blood loss (chronic): Secondary | ICD-10-CM

## 2022-01-28 MED ORDER — SODIUM CHLORIDE 0.9 % IV SOLN: Freq: Once | INTRAVENOUS | Status: AC

## 2022-01-28 MED ORDER — SODIUM CHLORIDE 0.9 % IV SOLN
300.0000 mg | Freq: Once | INTRAVENOUS | Status: AC
  Administered 2022-01-28: 300 mg via INTRAVENOUS
  Filled 2022-01-28: qty 300

## 2022-01-28 MED ORDER — LORATADINE 10 MG PO TABS
10.0000 mg | ORAL_TABLET | Freq: Once | ORAL | Status: AC
  Administered 2022-01-28: 10 mg via ORAL
  Filled 2022-01-28: qty 1

## 2022-01-28 MED ORDER — ACETAMINOPHEN 325 MG PO TABS
650.0000 mg | ORAL_TABLET | Freq: Once | ORAL | Status: AC
  Administered 2022-01-28: 650 mg via ORAL
  Filled 2022-01-28: qty 2

## 2022-01-28 NOTE — Patient Instructions (Signed)

## 2022-01-28 NOTE — Progress Notes (Signed)
Pt observed for 30 minutes post Venofer infusion. Pt tolerated trtmt well w/out incident. VSS at discharge.  Ambulatory to lobby.   

## 2022-01-29 ENCOUNTER — Ambulatory Visit (INDEPENDENT_AMBULATORY_CARE_PROVIDER_SITE_OTHER): Payer: Medicare Other | Admitting: Primary Care

## 2022-01-29 ENCOUNTER — Encounter: Payer: Self-pay | Admitting: Primary Care

## 2022-01-29 VITALS — BP 136/62 | HR 87 | Temp 97.8°F | Ht 64.0 in | Wt 309.2 lb

## 2022-01-29 DIAGNOSIS — Z8669 Personal history of other diseases of the nervous system and sense organs: Secondary | ICD-10-CM | POA: Diagnosis not present

## 2022-01-29 DIAGNOSIS — R0609 Other forms of dyspnea: Secondary | ICD-10-CM

## 2022-01-29 DIAGNOSIS — J45909 Unspecified asthma, uncomplicated: Secondary | ICD-10-CM | POA: Diagnosis not present

## 2022-01-29 NOTE — Patient Instructions (Addendum)
Sleep apnea is defined as period of 10 seconds or longer when you stop breathing at night. This can happen multiple times a night. Dx sleep apnea is when this occurs more than 5 times an hour.  ?  ?Mild OSA 5-15 apneic events an hour ?Moderate OSA 15-30 apneic events an hour ?Severe OSA > 30 apneic events an hour ?  ?Untreated sleep apnea puts you at higher risk for cardiac arrhythmias, pulmonary HTN, stroke and diabetes ?  ?Treatment options include weight loss, side sleeping position, oral appliance, CPAP therapy or referral to ENT for possible surgical options  ?  ?Recommendations: ?Focus on side sleeping position or elevate head of bed with wedge pillow ?Work on weight loss efforts  ?Do not drive if experiencing excessive daytime sleepiness of fatigue  ?  ?Orders: ?Home sleep study re: hx sleep apnea ?PFTs re: dyspnea with exertion ?  ?Follow-up: ?4-6 week visit with 1 hour PFTs prior / review sleep study results  ?

## 2022-01-29 NOTE — Progress Notes (Signed)
? ?'@Patient'$  ID: Danielle [name], female    DOB: 1971-02-14, 51 y.o.   MRN: [id number] ? ?Chief Complaint  ?Patient presents with  ? Consult  ?  Sleep Consult-  ? ? ?Referring provider: ?Benito Mccreedy, MD ? ?HPI: ?51 year old female, never smoked.  Past medical history significant for hypertension, asthma, GERD, cocaine abuse, depression, obesity. ? ?01/29/2022 ?Patient presents today for sleep consult. She had sleep study several years ago, results are not available to in chart. Sounds like she had previously been on CPAP. She continues to have symptoms of snoring and daytime sleepiness. She has trouble falling asleep at night. She goes to bed at 4p and falls asleep around 8pm. She wakes up 2-3 times a night. She starts her day at 6am. She takes benadryl and tylenol pm to sleep.  ? ?Additionally she experiences dyspnea with exertion and occasional wheezing. She has used nebulizes in the past. Her weight tends to fluctuate. She had spirometry in October 2021 with asthma and allergy that showed moderate restriction without BD response.  ? ?Sleep questionnaire ?Symptoms- Hx sleep apnea. Daytime sleepiness, snoring ?Prior sleep study- 15 years ago  ?Bedtime- 4-8pm  ?Time to fall asleep- several hours  ?Nocturnal awakenings- 2-3 times  ?Out of bed/start of day- 6am ?Weight changes- fluctuates  ?Do you operate heavy machinery- No ?Do you currently wear CPAP- No ?Do you current wear oxygen- No ?Epworth- 18 ? ? ?Allergies  ?Allergen Reactions  ? Percocet [Oxycodone-Acetaminophen] Nausea And Vomiting  ? ? ?Immunization History  ?Administered Date(s) Administered  ? Influenza,inj,Quad PF,6+ Mos 06/01/2017, 08/11/2018  ? PFIZER(Purple Top)SARS-COV-2 Vaccination 02/09/2020  ? Tdap 09/08/2018  ? ? ?Past Medical History:  ?Diagnosis Date  ? Arthritis   ? Asthma   ? Bipolar 1 disorder ([place])   ? Drug abuse in remission [facility])   ? crack-cocaine-11/12 last   ? GERD (gastroesophageal reflux disease)   ? Hypertension   ?  Migraines   ? Sleep apnea   ? states had test-no cpap ordered-does snore  ? ? ?Tobacco History: ?Social History  ? ?Tobacco Use  ?Smoking Status Never  ?Smokeless Tobacco Never  ? ?Counseling given: Not Answered ? ? ?Outpatient Medications Prior to Visit  ?Medication Sig Dispense Refill  ? acetaminophen (TYLENOL) 650 MG CR tablet Take 650 mg by mouth every 8 (eight) hours as needed for pain.    ? albuterol (PROVENTIL) (2.5 MG/3ML) 0.083% nebulizer solution Take 2.5 mg by nebulization every 6 (six) hours as needed for wheezing or shortness of breath.    ? albuterol (VENTOLIN HFA) 108 (90 Base) MCG/ACT inhaler Inhale 2 puffs into the lungs every 6 (six) hours as needed for wheezing or shortness of breath.    ? diphenhydramine-acetaminophen (TYLENOL PM) 25-500 MG TABS tablet Take 1 tablet by mouth at bedtime as needed.    ? Phenylephrine-APAP-guaiFENesin (TYLENOL SINUS SEVERE) 5-325-200 MG TABS Take 1 tablet by mouth daily as needed (sinus congestion).    ? sertraline (ZOLOFT) 50 MG tablet Take 50 mg by mouth at bedtime.    ? SYMBICORT 160-4.5 MCG/ACT inhaler Inhale 2 puffs into the lungs in the morning and at bedtime.    ? carbamazepine (TEGRETOL XR) 100 MG 12 hr tablet Take 100 mg by mouth 2 (two) times daily. (Patient not taking: Reported on 01/29/2022)    ? diclofenac Sodium (VOLTAREN) 1 % GEL Apply 1 application. topically daily as needed (foot pain). (Patient not taking: Reported on 01/29/2022)    ? famotidine (PEPCID) 10 MG  tablet Take 10 mg by mouth daily as needed for heartburn or indigestion. (Patient not taking: Reported on 01/29/2022)    ? ferrous sulfate 325 (65 FE) MG tablet Take 325 mg by mouth daily with breakfast. (Patient not taking: Reported on 01/29/2022)    ? furosemide (LASIX) 20 MG tablet Take 20 mg by mouth daily. (Patient not taking: Reported on 01/29/2022)    ? gabapentin (NEURONTIN) 300 MG capsule Take 1 capsule by mouth daily. (Patient not taking: Reported on 01/29/2022)    ? hydrOXYzine  (ATARAX/VISTARIL) 25 MG tablet Take 12.5-25 mg by mouth 3 (three) times daily as needed for anxiety. (Patient not taking: Reported on 01/29/2022)    ? losartan (COZAAR) 25 MG tablet Take 25 mg by mouth daily. (Patient not taking: Reported on 01/29/2022)    ? omeprazole (PRILOSEC) 40 MG capsule Take 40 mg by mouth daily. (Patient not taking: Reported on 01/29/2022)    ? RIVAROXABAN (XARELTO) VTE STARTER PACK (15 & 20 MG) Follow package directions: Take one '15mg'$  tablet by mouth twice a day. On day 22, switch to one '20mg'$  tablet once a day. Take with food. 51 each 0  ? ?No facility-administered medications prior to visit.  ? ? ?Review of Systems ? ?Review of Systems  ?Constitutional:  Positive for fatigue.  ?HENT: Negative.    ?Respiratory:  Positive for apnea, shortness of breath and wheezing.   ?Psychiatric/Behavioral:  Positive for sleep disturbance.   ? ? ?Physical Exam ? ?BP 136/62 (BP Location: Left Arm, Cuff Size: Large)   Pulse 87   Temp 97.8 ?F (36.6 ?C) (Temporal)   Ht '5\' 4"'$  (1.626 m)   Wt (!) 309 lb 3.2 oz (140.3 kg)   LMP 12/31/2021 (Exact Date)   SpO2 100%   BMI 53.07 kg/m?  ?Physical Exam ?Constitutional:   ?   Appearance: Normal appearance.  ?HENT:  ?   Head: Normocephalic and atraumatic.  ?   Mouth/Throat:  ?   Mouth: Mucous membranes are moist.  ?   Pharynx: Oropharynx is clear.  ?Cardiovascular:  ?   Rate and Rhythm: Normal rate and regular rhythm.  ?Pulmonary:  ?   Effort: Pulmonary effort is normal.  ?   Breath sounds: Normal breath sounds.  ?Musculoskeletal:     ?   General: Normal range of motion.  ?Skin: ?   General: Skin is warm and dry.  ?Neurological:  ?   General: No focal deficit present.  ?   Mental Status: She is alert and oriented to person, place, and time. Mental status is at baseline.  ?Psychiatric:     ?   Mood and Affect: Mood normal.     ?   Behavior: Behavior normal.     ?   Thought Content: Thought content normal.     ?   Judgment: Judgment normal.  ?  ? ?Lab Results: ? ?CBC ?    ?Component Value Date/Time  ? WBC 5.7 01/15/2022 1234  ? RBC 3.32 (L) 01/15/2022 1234  ? HGB 7.6 (L) 01/15/2022 1234  ? HGB 7.9 (L) 01/09/2022 0909  ? HCT 26.9 (L) 01/15/2022 1255  ? HCT 25.4 (L) 01/15/2022 1234  ? PLT 406 (H) 01/15/2022 1234  ? PLT 426 08/29/2020 1120  ? MCV 76.5 (L) 01/15/2022 1234  ? MCV 82 08/29/2020 1120  ? MCH 22.9 (L) 01/15/2022 1234  ? MCHC 29.9 (L) 01/15/2022 1234  ? RDW 19.1 (H) 01/15/2022 1234  ? RDW 16.1 (H) 08/29/2020 1120  ?  LYMPHSABS 2.9 01/15/2022 1234  ? LYMPHSABS 2.9 08/29/2020 1120  ? MONOABS 0.5 01/15/2022 1234  ? EOSABS 0.1 01/15/2022 1234  ? EOSABS 0.2 08/29/2020 1120  ? BASOSABS 0.1 01/15/2022 1234  ? BASOSABS 0.0 08/29/2020 1120  ? ? ?BMET ?   ?Component Value Date/Time  ? NA 140 01/15/2022 1234  ? NA 138 01/09/2022 0909  ? K 4.1 01/15/2022 1234  ? CL 106 01/15/2022 1234  ? CO2 28 01/15/2022 1234  ? GLUCOSE 93 01/15/2022 1234  ? BUN 13 01/15/2022 1234  ? BUN 13 01/09/2022 0909  ? CREATININE 0.80 01/15/2022 1234  ? CALCIUM 8.7 (L) 01/15/2022 1234  ? GFRNONAA >60 01/15/2022 1234  ? GFRAA 67 08/29/2020 1120  ? ? ?BNP ?No results found for: BNP ? ?ProBNP ?   ?Component Value Date/Time  ? PROBNP 46 01/09/2022 0909  ? ? ?Imaging: ?No results found. ? ? ?Assessment & Plan:  ? ?Hx of sleep apnea ?- Hx sleep apnea, dx > 15 year ago. May have been on cpap in the past. Patient has symptoms of snoring, daytime sleepiness.  Epworth 18.  Concern patient could have obstructive sleep apnea, needs home sleep study to evaluate. Discussed risk of untreated sleep apnea including cardiac arrhythmias, pulm HTN, stroke, DM. We briefly reviewed treatment options. Encouraged patient to work on weight loss efforts and focus on side sleeping position/elevate head of bed. Advised against driving if experiencing excessive daytime sleepiness. Follow-up in 4-6 weeks to review sleep study results and discuss treatment options further. ? ? ?Asthma, not well controlled ?- Dx with asthma many years ago.  Spirometry in 2023 showed some restriction with 12% improvement in FEV1 post bronchodilator treatment. She continues to experience dyspnea mainly with exertion and occasional wheezing. She has had nebulizer's in the past. No [place]

## 2022-01-30 ENCOUNTER — Encounter: Payer: Medicare Other | Admitting: Podiatry

## 2022-01-30 NOTE — Progress Notes (Signed)
Called and spoke to patient she stated she is f/u for her anemia and is getting iron infusion and she voiced understanding to watch what she consume

## 2022-02-03 ENCOUNTER — Ambulatory Visit: Payer: Medicare Other | Admitting: Obstetrics and Gynecology

## 2022-02-03 ENCOUNTER — Encounter: Payer: Medicare Other | Admitting: Podiatry

## 2022-02-03 DIAGNOSIS — Z0289 Encounter for other administrative examinations: Secondary | ICD-10-CM

## 2022-02-04 ENCOUNTER — Inpatient Hospital Stay: Payer: Medicare Other

## 2022-02-11 ENCOUNTER — Inpatient Hospital Stay: Payer: Medicare Other

## 2022-02-16 ENCOUNTER — Encounter: Payer: Self-pay | Admitting: Primary Care

## 2022-02-17 ENCOUNTER — Ambulatory Visit: Payer: Medicare Other | Admitting: Cardiology

## 2022-02-24 ENCOUNTER — Telehealth: Payer: Self-pay | Admitting: Orthopedic Surgery

## 2022-02-24 NOTE — Telephone Encounter (Signed)
Pt called requesting a referral be sent to [facility]. Please call pt about this matter at [phone]. She states she would rather go there for therapy ?

## 2022-02-25 ENCOUNTER — Inpatient Hospital Stay: Payer: Medicare Other | Attending: Hematology

## 2022-02-25 ENCOUNTER — Other Ambulatory Visit: Payer: Self-pay

## 2022-02-25 VITALS — BP 128/78 | HR 80 | Temp 98.5°F | Resp 18

## 2022-02-25 DIAGNOSIS — Z170 Estrogen receptor positive status [ER+]: Secondary | ICD-10-CM | POA: Insufficient documentation

## 2022-02-25 DIAGNOSIS — Z923 Personal history of irradiation: Secondary | ICD-10-CM | POA: Insufficient documentation

## 2022-02-25 DIAGNOSIS — C50412 Malignant neoplasm of upper-outer quadrant of left female breast: Secondary | ICD-10-CM | POA: Insufficient documentation

## 2022-02-25 DIAGNOSIS — D500 Iron deficiency anemia secondary to blood loss (chronic): Secondary | ICD-10-CM

## 2022-02-25 MED ORDER — ACETAMINOPHEN 325 MG PO TABS
650.0000 mg | ORAL_TABLET | Freq: Once | ORAL | Status: AC
  Administered 2022-02-25: 650 mg via ORAL
  Filled 2022-02-25: qty 2

## 2022-02-25 MED ORDER — SODIUM CHLORIDE 0.9 % IV SOLN: Freq: Once | INTRAVENOUS | Status: AC

## 2022-02-25 MED ORDER — SODIUM CHLORIDE 0.9 % IV SOLN
300.0000 mg | Freq: Once | INTRAVENOUS | Status: AC
  Administered 2022-02-25: 300 mg via INTRAVENOUS
  Filled 2022-02-25: qty 300

## 2022-02-25 MED ORDER — LORATADINE 10 MG PO TABS
10.0000 mg | ORAL_TABLET | Freq: Once | ORAL | Status: AC
  Administered 2022-02-25: 10 mg via ORAL
  Filled 2022-02-25: qty 1

## 2022-02-25 NOTE — Progress Notes (Signed)
Patient has had iron previously and tolerated today's treatment well. Patient stayed for only 5 minutes. VSS. IV removed with tip intact. ?BP 128/78 (BP Location: Right Arm, Patient Position: Sitting)   Pulse 80   Temp 98.5 ?F (36.9 ?C) (Oral)   Resp 18   SpO2 100%  ? ?

## 2022-02-25 NOTE — Patient Instructions (Signed)

## 2022-03-02 DIAGNOSIS — Z8669 Personal history of other diseases of the nervous system and sense organs: Secondary | ICD-10-CM | POA: Insufficient documentation

## 2022-03-02 NOTE — Assessment & Plan Note (Signed)
-   Hx sleep apnea, dx > 15 year ago. May have been on cpap in the past. Patient has symptoms of snoring, daytime sleepiness.  Epworth 18.  Concern patient could have obstructive sleep apnea, needs home sleep study to evaluate. Discussed risk of untreated sleep apnea including cardiac arrhythmias, pulm HTN, stroke, DM. We briefly reviewed treatment options. Encouraged patient to work on weight loss efforts and focus on side sleeping position/elevate head of bed. Advised against driving if experiencing excessive daytime sleepiness. Follow-up in 4-6 weeks to review sleep study results and discuss treatment options further. ? ?

## 2022-03-02 NOTE — Assessment & Plan Note (Signed)
-   Dx with asthma many years ago. Spirometry in 2023 showed some restriction with 12% improvement in FEV1 post bronchodilator treatment. She continues to experience dyspnea mainly with exertion and occasional wheezing. She has had nebulizer's in the past. No using inhalers appropriately. Needs update PFTs.  ?

## 2022-03-04 ENCOUNTER — Inpatient Hospital Stay: Payer: Medicare Other

## 2022-03-04 ENCOUNTER — Telehealth: Payer: Self-pay

## 2022-03-04 NOTE — Telephone Encounter (Signed)
Attempted to call patient on primary number regarding today's infusion appointment. No answer. Unable to leave voicemail as line was busy.  ?Charge RN aware.  ?

## 2022-03-13 ENCOUNTER — Other Ambulatory Visit: Payer: Self-pay

## 2022-03-13 DIAGNOSIS — D500 Iron deficiency anemia secondary to blood loss (chronic): Secondary | ICD-10-CM

## 2022-03-17 ENCOUNTER — Inpatient Hospital Stay: Payer: Medicare Other

## 2022-03-17 ENCOUNTER — Inpatient Hospital Stay: Payer: Medicare Other | Admitting: Hematology

## 2022-03-20 ENCOUNTER — Other Ambulatory Visit: Payer: Self-pay | Admitting: Family Medicine

## 2022-03-20 ENCOUNTER — Ambulatory Visit (INDEPENDENT_AMBULATORY_CARE_PROVIDER_SITE_OTHER): Payer: Medicare Other | Admitting: Internal Medicine

## 2022-03-20 ENCOUNTER — Encounter: Payer: Self-pay | Admitting: Primary Care

## 2022-03-20 ENCOUNTER — Ambulatory Visit (INDEPENDENT_AMBULATORY_CARE_PROVIDER_SITE_OTHER): Payer: Medicare Other | Admitting: Primary Care

## 2022-03-20 VITALS — BP 128/80 | HR 77 | Temp 98.3°F | Ht 65.0 in | Wt 292.5 lb

## 2022-03-20 DIAGNOSIS — R062 Wheezing: Secondary | ICD-10-CM

## 2022-03-20 DIAGNOSIS — R0609 Other forms of dyspnea: Secondary | ICD-10-CM | POA: Diagnosis not present

## 2022-03-20 DIAGNOSIS — J45909 Unspecified asthma, uncomplicated: Secondary | ICD-10-CM

## 2022-03-20 DIAGNOSIS — Z1231 Encounter for screening mammogram for malignant neoplasm of breast: Secondary | ICD-10-CM

## 2022-03-20 DIAGNOSIS — R0683 Snoring: Secondary | ICD-10-CM | POA: Insufficient documentation

## 2022-03-20 LAB — PULMONARY FUNCTION TEST
DL/VA % pred: 94 %
DL/VA: 4.03 ml/min/mmHg/L
DLCO cor % pred: 68 %
DLCO cor: 14.89 ml/min/mmHg
DLCO unc % pred: 69 %
DLCO unc: 14.98 ml/min/mmHg
FEF 25-75 Post: 0.81 L/sec
FEF 25-75 Pre: 1.2 L/sec
FEF2575-%Change-Post: -32 %
FEF2575-%Pred-Post: 33 %
FEF2575-%Pred-Pre: 49 %
FEV1-%Change-Post: -11 %
FEV1-%Pred-Post: 62 %
FEV1-%Pred-Pre: 70 %
FEV1-Post: 1.48 L
FEV1-Pre: 1.68 L
FEV1FVC-%Change-Post: -7 %
FEV1FVC-%Pred-Pre: 83 %
FEV6-%Change-Post: -4 %
FEV6-%Pred-Post: 81 %
FEV6-%Pred-Pre: 86 %
FEV6-Post: 2.36 L
FEV6-Pre: 2.48 L
FEV6FVC-%Pred-Post: 102 %
FEV6FVC-%Pred-Pre: 102 %
FVC-%Change-Post: -4 %
FVC-%Pred-Post: 79 %
FVC-%Pred-Pre: 83 %
FVC-Post: 2.36 L
FVC-Pre: 2.48 L
Post FEV1/FVC ratio: 63 %
Post FEV6/FVC ratio: 100 %
Pre FEV1/FVC ratio: 68 %
Pre FEV6/FVC Ratio: 100 %

## 2022-03-20 LAB — POCT EXHALED NITRIC OXIDE: FeNO level (ppb): 17

## 2022-03-20 NOTE — Patient Instructions (Signed)
Attempted Full PFT Today. Performed Pre/Post Spirometry and DLCO.  

## 2022-03-20 NOTE — Assessment & Plan Note (Addendum)
-   Patient has a history of asthma.  Never smoked.  Experiencing worsening dyspnea symptoms over the last 3 months with occasional wheezing. Spirometry today showed moderate obstruction without bronchodilator response.  FEV1 62%, ratio 63%.  FENO was 17.  Patient was given sample of Breo Ellipta 100 mcg to take daily.  Follow-up in 2 weeks to assess response to inhaler regimen.

## 2022-03-20 NOTE — Progress Notes (Signed)
$'@Patient'p$  ID: Danielle [name], female    DOB: [birth year], 51 y.o.   MRN: [id number]  No chief complaint on file.   Referring provider: Trey Sailors, PA  HPI: 51 year old female, never smoked.  Past medical history significant for hypertension, asthma, GERD, cocaine abuse, depression, obesity.  Previous [facility] pulmonary encounter: 01/29/2022 Patient presents today for sleep consult. She had sleep study several years ago, results are not available in chart. Sounds like she had previously been on CPAP. She continues to have symptoms of snoring and daytime sleepiness. She has trouble falling asleep at night. She goes to bed at 4p and falls asleep around 8pm. She wakes up 2-3 times a night. She starts her day at 6am. She takes benadryl and tylenol pm to sleep.   Additionally she experiences dyspnea with exertion and occasional wheezing. She has used nebulizes in the past. Her weight tends to fluctuate. She had spirometry in October 2021 with asthma and allergy that showed moderate restriction without BD response.   Sleep questionnaire Symptoms- Hx sleep apnea. Daytime sleepiness, snoring Prior sleep study- 15 years ago  Bedtime- 4-8pm  Time to fall asleep- several hours  Nocturnal awakenings- 2-3 times  Out of bed/start of day- 6am Weight changes- fluctuates  Do you operate heavy machinery- No Do you currently wear CPAP- No Do you current wear oxygen- No Epworth- 18   03/20/2022- Interim hx  Patient presents today for a 4 to 6-week follow-up sleep consult and dyspnea symptoms.  She has not had sleep study yet.  Dyspnea symptoms have been more prominent over the last 3 months, she cannot walk more than 150 to 200 feet without getting winded.  She experiences occasional wheezing.  She has tried [employer] in the past but did not like powdered medication and having to rinse her mouth out after use.  She uses 3-4 pillows to bite her head at night while sleeping.  Pulmonary function testing  today showed moderate restriction in lung function along with mild diffusion defect.  She has had no recent chest imaging. She had a chest x-ray in 2021 due to shortness of breath that did show mildly progressive cardiomegaly with mild interstitial pulmonary edema or interstitial pneumonitis..  We will order CT chest to assess for interstitial lung disease.   Pulmonary function testing 03/20/2022 FVC 2.36 (79%), FEV1 1.48 (62%), ratio 63, DLCOUNC 14.9 (69%) Moderate restriction/obstruction without bronchodilator response.  Mild diffusion defect.  03/20/2022 FENO>> 17   Allergies  Allergen Reactions   Percocet [Oxycodone-Acetaminophen] Nausea And Vomiting    Immunization History  Administered Date(s) Administered   Influenza,inj,Quad PF,6+ Mos 06/01/2017, 08/11/2018   PFIZER(Purple Top)SARS-COV-2 Vaccination 02/09/2020   Tdap 09/08/2018    Past Medical History:  Diagnosis Date   Arthritis    Asthma    Bipolar 1 disorder ([place])    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[place])    crack-cocaine-11/12 last    GERD (gastroesophageal reflux disease)    Hypertension    Migraines    Sleep apnea    states had test-no cpap ordered-does snore    Tobacco History: Social History   Tobacco Use  Smoking Status Never  Smokeless Tobacco Never   Counseling given: Not Answered   Outpatient Medications Prior to Visit  Medication Sig Dispense Refill   acetaminophen (TYLENOL) 650 MG CR tablet Take 650 mg by mouth every 8 (eight) hours as needed for pain.     albuterol (PROVENTIL) (2.5 MG/3ML) 0.083% nebulizer solution Take 2.5 mg by nebulization  every 6 (six) hours as needed for wheezing or shortness of breath.     albuterol (VENTOLIN HFA) 108 (90 Base) MCG/ACT inhaler Inhale 2 puffs into the lungs every 6 (six) hours as needed for wheezing or shortness of breath.     carbamazepine (TEGRETOL XR) 100 MG 12 hr tablet Take 100 mg by mouth 2 (two) times daily.     diclofenac Sodium (VOLTAREN) 1 % GEL Apply 1  application. topically daily as needed (foot pain).     diphenhydramine-acetaminophen (TYLENOL PM) 25-500 MG TABS tablet Take 1 tablet by mouth at bedtime as needed.     famotidine (PEPCID) 10 MG tablet Take 10 mg by mouth daily as needed for heartburn or indigestion.     ferrous sulfate 325 (65 FE) MG tablet Take 325 mg by mouth daily with breakfast.     furosemide (LASIX) 20 MG tablet Take 20 mg by mouth daily.     gabapentin (NEURONTIN) 300 MG capsule Take 1 capsule by mouth daily.     hydrOXYzine (ATARAX/VISTARIL) 25 MG tablet Take 12.5-25 mg by mouth 3 (three) times daily as needed for anxiety.     losartan (COZAAR) 25 MG tablet Take 25 mg by mouth daily.     omeprazole (PRILOSEC) 40 MG capsule Take 40 mg by mouth daily.     Phenylephrine-APAP-guaiFENesin (TYLENOL SINUS SEVERE) 5-325-200 MG TABS Take 1 tablet by mouth daily as needed (sinus congestion).     RIVAROXABAN (XARELTO) VTE STARTER PACK (15 & 20 MG) Follow package directions: Take one '15mg'$  tablet by mouth twice a day. On day 22, switch to one '20mg'$  tablet once a day. Take with food. 51 each 0   sertraline (ZOLOFT) 50 MG tablet Take 50 mg by mouth at bedtime.     SYMBICORT 160-4.5 MCG/ACT inhaler Inhale 2 puffs into the lungs in the morning and at bedtime.     No facility-administered medications prior to visit.      Review of Systems  Review of Systems   Physical Exam  BP 128/80   Pulse 77   Temp 98.3 F (36.8 C) (Oral)   Ht '5\' 5"'$  (1.651 m)   Wt 292 lb 8 oz (132.7 kg)   SpO2 100%   BMI 48.67 kg/m  Physical Exam   Lab Results:  CBC    Component Value Date/Time   WBC 5.7 01/15/2022 1234   RBC 3.32 (L) 01/15/2022 1234   HGB 7.6 (L) 01/15/2022 1234   HGB 7.9 (L) 01/09/2022 0909   HCT 26.9 (L) 01/15/2022 1255   HCT 25.4 (L) 01/15/2022 1234   PLT 406 (H) 01/15/2022 1234   PLT 426 08/29/2020 1120   MCV 76.5 (L) 01/15/2022 1234   MCV 82 08/29/2020 1120   MCH 22.9 (L) 01/15/2022 1234   MCHC 29.9 (L) 01/15/2022  1234   RDW 19.1 (H) 01/15/2022 1234   RDW 16.1 (H) 08/29/2020 1120   LYMPHSABS 2.9 01/15/2022 1234   LYMPHSABS 2.9 08/29/2020 1120   MONOABS 0.5 01/15/2022 1234   EOSABS 0.1 01/15/2022 1234   EOSABS 0.2 08/29/2020 1120   BASOSABS 0.1 01/15/2022 1234   BASOSABS 0.0 08/29/2020 1120    BMET    Component Value Date/Time   NA 140 01/15/2022 1234   NA 138 01/09/2022 0909   K 4.1 01/15/2022 1234   CL 106 01/15/2022 1234   CO2 28 01/15/2022 1234   GLUCOSE 93 01/15/2022 1234   BUN 13 01/15/2022 1234   BUN 13 01/09/2022 0909  CREATININE 0.80 01/15/2022 1234   CALCIUM 8.7 (L) 01/15/2022 1234   GFRNONAA >60 01/15/2022 1234   GFRAA 67 08/29/2020 1120    BNP No results found for: BNP  ProBNP    Component Value Date/Time   PROBNP 46 01/09/2022 0909    Imaging: No results found.   Assessment & Plan:   Asthma, not well controlled - Patient has a history of asthma.  Never smoked.  Experiencing worsening dyspnea symptoms over the last 3 months with occasional wheezing. Spirometry today showed moderate obstruction without bronchodilator response.  FEV1 62%, ratio 63%.  FENO was 17.  Patient was given sample of Breo Ellipta 100 mcg to take daily.  Follow-up in 2 weeks to assess response to inhaler regimen.  Snoring - Patient was seen for sleep consult back in April due to snoring.  She was ordered for home sleep study which is still pending.  We will follow-up with [facility] team to make sure that this gets scheduled.   Martyn Ehrich, NP 03/20/2022

## 2022-03-20 NOTE — Progress Notes (Signed)
Attempted Full PFT Today. Performed Pre/Post Spirometry and DLCO.  

## 2022-03-20 NOTE — Assessment & Plan Note (Signed)
-   Patient was seen for sleep consult back in April due to snoring.  She was ordered for home sleep study which is still pending.  We will follow-up with [facility] team to make sure that this gets scheduled.

## 2022-03-20 NOTE — Patient Instructions (Addendum)
Recommendations: - Start BREO- take 1 puff daily in the morning (light blue and white) - You can use Albuterol rescue inhaler 2 puffs every 4-6 hours for breakthrough shortness of breath/wheezing - We will check on home sleep study and call you to set this up   Orders: - HRCT chest QH:[id number]   Follow-up: - 2 week fu with BETH NP to assess response to inhaler

## 2022-03-23 NOTE — Progress Notes (Signed)
Reviewed and agree with assessment/plan.   Chesley Mires, MD [facility] Pulmonary/Critical Care 03/23/2022, 1:49 PM Pager:  [phone]

## 2022-04-01 ENCOUNTER — Inpatient Hospital Stay: Payer: Medicare Other | Admitting: Hematology

## 2022-04-01 ENCOUNTER — Inpatient Hospital Stay: Payer: Medicare Other

## 2022-04-01 NOTE — Progress Notes (Incomplete)
HEMATOLOGY/ONCOLOGY CLINIC NOTE  Date of Service: 04/01/2022  Patient Care Team: Trey Sailors, PA as PCP - General (Physician Assistant)  CHIEF COMPLAINTS/PURPOSE OF CONSULTATION:  Evaluation and management of microcytic anemia caused by iron deficiency  HISTORY OF PRESENTING ILLNESS:  Danielle [name] is a wonderful 51 y.o. female who has been referred to [place] by Dr. Benito Mccreedy, MD for evaluation and management of microcytic anemia caused by iron deficiency. She reports She is doing well with no new symptoms or concerns.  She reports symptoms of PICA such as eating ice and potatoes. She notes having headaches and light headedness that is relieved by the consumption of ice.  She notes that she still has regular menstruation and her period flow has been heavier and more dark for the last few months. She describes the flow as inconsistent month by month.   She notes that her stools tend to be a red color.   She reports no history of hemorrhoids.  She notes that she has been encouraged to eat more red meat and she has been increasing her consumption of steak.   She reports that she was first told she was anemic when she was younger and she was prescribed some pills that she does not recall the name of.   She says that she has been on her acid reflux medication Protonix for more than 5 years. We discussed that the Protonix could be inhibiting iron absorption and she will not stop the Protonix at this time to due to Helicobacter Pylori related.  No alcohol consumption. No smoking.  She reports preciously using cocaine but says that she has stopped usage for the past month.  She reports some recent weight gain.  She notes some upper back pain.  She notes some upper abdominal pain.  She reports some pain in lower extremities.  Labs done 11/11/2021 discussed with pt in detail.  Iron saturation is 6%.  Labs done 01/01/2022 discussed with pt in detail. CBC shows  reduced hemoglobin at 7.7, HCT 26.3, and increased RDW at 20.8%. CMP unremarkable.  INTERVAL HISTORY: Danielle [name] is a 51 y.o. female here for continued evaluation and management of microcytic anemia caused by iron deficiency. She reports She is doing well with no new symptoms or concerns.  ***  Labs done today were reviewed in detail.  MEDICAL HISTORY:  Past Medical History:  Diagnosis Date   Arthritis    Asthma    Bipolar 1 disorder ([place])    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[place])    crack-cocaine-11/12 last    GERD (gastroesophageal reflux disease)    Hypertension    Migraines    Sleep apnea    states had test-no cpap ordered-does snore    SURGICAL HISTORY: Past Surgical History:  Procedure Laterality Date   BIOPSY  03/08/2020   Procedure: BIOPSY;  Surgeon: [name], MD;  Location: [facility];  Service: Gastroenterology;;   COLONOSCOPY WITH PROPOFOL N/A 03/08/2020   Procedure: COLONOSCOPY WITH PROPOFOL;  Surgeon: [name], MD;  Location: [facility];  Service: Gastroenterology;  Laterality: N/A;   ESOPHAGOGASTRODUODENOSCOPY (EGD) WITH PROPOFOL N/A 03/08/2020   Procedure: ESOPHAGOGASTRODUODENOSCOPY (EGD) WITH PROPOFOL;  Surgeon: [name], MD;  Location: [facility];  Service: Gastroenterology;  Laterality: N/A;   FRACTURE SURGERY     rt femer/knee-plate-screws-   ORIF ANKLE FRACTURE  03/16/2012   Procedure: OPEN REDUCTION INTERNAL FIXATION (ORIF) ANKLE FRACTURE;  Surgeon: Colin Rhein, MD;  Location: [facility];  Service: Orthopedics;  Laterality: Right;  right lateral malleolus fracture    SOCIAL HISTORY: Social History   Socioeconomic History   Marital status: Married    Spouse name: Not on file   Number of children: 4   Years of education: Not on file   Highest education level: Not on file  Occupational History   Not on file  Tobacco Use   Smoking status: Never   Smokeless tobacco: Never  Vaping Use   Vaping Use: Never used   Substance and Sexual Activity   Alcohol use: No    Comment: occasionally   Drug use: Not Currently    Types: "Crack" cocaine, Cocaine    Comment: remission 11/12   Sexual activity: Not on file  Other Topics Concern   Not on file  Social History Narrative   Not on file   Social Determinants of Health   Financial Resource Strain: Not on file  Food Insecurity: Not on file  Transportation Needs: Not on file  Physical Activity: Not on file  Stress: Not on file  Social Connections: Not on file  Intimate Partner Violence: Not on file    FAMILY HISTORY: Family History  Problem Relation Age of Onset   Cancer Father    High blood pressure Father    Asthma Sister    Eczema Sister    SIDS Son    Allergic rhinitis Neg Hx    Urticaria Neg Hx     ALLERGIES:  is allergic to percocet [oxycodone-acetaminophen].  MEDICATIONS:  Current Outpatient Medications  Medication Sig Dispense Refill   acetaminophen (TYLENOL) 650 MG CR tablet Take 650 mg by mouth every 8 (eight) hours as needed for pain.     albuterol (PROVENTIL) (2.5 MG/3ML) 0.083% nebulizer solution Take 2.5 mg by nebulization every 6 (six) hours as needed for wheezing or shortness of breath.     albuterol (VENTOLIN HFA) 108 (90 Base) MCG/ACT inhaler Inhale 2 puffs into the lungs every 6 (six) hours as needed for wheezing or shortness of breath.     carbamazepine (TEGRETOL XR) 100 MG 12 hr tablet Take 100 mg by mouth 2 (two) times daily.     diclofenac Sodium (VOLTAREN) 1 % GEL Apply 1 application. topically daily as needed (foot pain).     diphenhydramine-acetaminophen (TYLENOL PM) 25-500 MG TABS tablet Take 1 tablet by mouth at bedtime as needed.     famotidine (PEPCID) 10 MG tablet Take 10 mg by mouth daily as needed for heartburn or indigestion.     ferrous sulfate 325 (65 FE) MG tablet Take 325 mg by mouth daily with breakfast.     furosemide (LASIX) 20 MG tablet Take 20 mg by mouth daily.     gabapentin (NEURONTIN) 300 MG  capsule Take 1 capsule by mouth daily.     hydrOXYzine (ATARAX/VISTARIL) 25 MG tablet Take 12.5-25 mg by mouth 3 (three) times daily as needed for anxiety.     losartan (COZAAR) 25 MG tablet Take 25 mg by mouth daily.     omeprazole (PRILOSEC) 40 MG capsule Take 40 mg by mouth daily.     Phenylephrine-APAP-guaiFENesin (TYLENOL SINUS SEVERE) 5-325-200 MG TABS Take 1 tablet by mouth daily as needed (sinus congestion).     RIVAROXABAN (XARELTO) VTE STARTER PACK (15 & 20 MG) Follow package directions: Take one '15mg'$  tablet by mouth twice a day. On day 22, switch to one '20mg'$  tablet once a day. Take with food. 51 each 0   sertraline (ZOLOFT) 50  MG tablet Take 50 mg by mouth at bedtime.     SYMBICORT 160-4.5 MCG/ACT inhaler Inhale 2 puffs into the lungs in the morning and at bedtime.     No current facility-administered medications for this visit.    REVIEW OF SYSTEMS:    10 Point review of Systems was done is negative except as noted above.  PHYSICAL EXAMINATION: ECOG PERFORMANCE STATUS: 1 - Symptomatic but completely ambulatory  . There were no vitals filed for this visit.  .There is no height or weight on file to calculate BMI. NAD*** GENERAL:alert, in no acute distress and comfortable SKIN: no acute rashes, no significant lesions EYES: conjunctiva are pink and non-injected, sclera anicteric NECK: supple, no JVD LYMPH:  no palpable lymphadenopathy in the cervical, axillary or inguinal regions LUNGS: clear to auscultation b/l with normal respiratory effort HEART: regular rate & rhythm ABDOMEN:  normoactive bowel sounds , non tender, not distended. Extremity: no pedal edema PSYCH: alert & oriented x 3 with fluent speech NEURO: no focal motor/sensory deficits  LABORATORY DATA:  I have reviewed the data as listed  .    Latest Ref Rng & Units 01/15/2022   12:55 PM 01/15/2022   12:34 PM 01/09/2022    9:09 AM  CBC  WBC 4.0 - 10.5 K/uL  5.7    Hemoglobin 12.0 - 15.0 g/dL  7.6  7.9    Hematocrit 34.0 - 46.6 % 26.9  25.4  26.1   Platelets 150 - 400 K/uL  406      .    Latest Ref Rng & Units 01/15/2022   12:34 PM 01/09/2022    9:09 AM 01/01/2022    9:01 AM  CMP  Glucose 70 - 99 mg/dL 93  71  82   BUN 6 - 20 mg/dL '13  13  11   '$ Creatinine 0.44 - 1.00 mg/dL 0.80  0.86  0.78   Sodium 135 - 145 mmol/L 140  138  141   Potassium 3.5 - 5.1 mmol/L 4.1  4.7  3.5   Chloride 98 - 111 mmol/L 106  103  103   CO2 22 - 32 mmol/L '28  21  30   '$ Calcium 8.9 - 10.3 mg/dL 8.7  8.9  8.4   Total Protein 6.5 - 8.1 g/dL 7.9  8.0    Total Bilirubin 0.3 - 1.2 mg/dL 0.3  0.2    Alkaline Phos 38 - 126 U/L 77  97    AST 15 - 41 U/L 13  13    ALT 0 - 44 U/L 7  9     Surgical pathology done 03/11/2020 revealed "FINAL MICROSCOPIC DIAGNOSIS:   A. ANTRUM:  - Gastric antral mucosa with Helicobacter pylori-associated gastritis  (confirmed with Warthin Starry stain)   B. ESOPHAGEAL, DISTAL ULCER, ESOPHAGOGASTRODUODENOSCOPY:  - Severe acute esophagitis with ulceration. See comment  - Negative for increased intraepithelial eosinophils   C. ESOPHAGEAL, BIOPSY:  - Esophageal squamous mucosa with no specific histopathologic changes  - Negative for increased intraepithelial eosinophils "  RADIOGRAPHIC STUDIES: I have personally reviewed the radiological images as listed and agreed with the findings in the report. No results found.  Upper Endoscopy done 03/08/2020 revealed "Esophageal ulcer with no bleeding and no stigmata of recent bleeding. Biopsied."  Colonoscopy done 03/08/2020 revealed "- Perianal skin tags found on perianal exam. - The entire examined colon is normal. - The examined portion of the ileum was normal. - Non-bleeding internal hemorrhoids. - No specimens collected."  ASSESSMENT &  PLAN:   51 y.o. very pleasant female with   1. Severe microcytic anemia caused by iron deficiency -Labs done 11/11/2021 discussed with pt in detail.  Iron saturation is 6%.  2. PICA  symptoms  3. Menorrhagia  4. Helicobacter Pylori related gastritis causing epigastric abdominal pain  Plan -I discussed available labs with the patient in details Labs done today were reviewed in detail. CBC shows Hgb of ***. CMP unremarkable. FeNO level at 17. Vitamin B12 at ***. Folate RBC at ***. Iron sat ratio at ***%. Ferritin at ***. -Patient's chart reviewed in detail -We will get labs today to evaluate his current status further and for treatment planning. -Labs will be done today -She will receive IV injectafer weekly x 2 doses -Return to clinic with Dr Irene Limbo with labs in 2 months -Work up for iron deficiency and labs for anemia ordered for today  No orders of the defined types were placed in this encounter.  Follow up: ***   All of the patients questions were answered with apparent satisfaction. The patient knows to call the clinic with any problems, questions or concerns.  I spent *** minutes counseling the patient face to face. The total time spent in the appointment was *** minutes and more than 50% was on counseling and direct patient cares.    Sullivan Lone MD MS AAHIVMS [facility] Hematology/Oncology Physician [facility]  (Office):       [phone] (Work cell):  [phone] (Fax):           [phone]  I, Melene Muller, am acting as scribe for Dr. Sullivan Lone, MD.

## 2022-04-08 ENCOUNTER — Ambulatory Visit (INDEPENDENT_AMBULATORY_CARE_PROVIDER_SITE_OTHER): Payer: Medicare Other | Admitting: Primary Care

## 2022-04-08 ENCOUNTER — Encounter: Payer: Self-pay | Admitting: Primary Care

## 2022-04-08 VITALS — BP 126/68 | HR 77 | Temp 98.2°F | Ht 64.0 in | Wt 302.0 lb

## 2022-04-08 DIAGNOSIS — J45909 Unspecified asthma, uncomplicated: Secondary | ICD-10-CM

## 2022-04-08 DIAGNOSIS — R0683 Snoring: Secondary | ICD-10-CM | POA: Diagnosis not present

## 2022-04-08 DIAGNOSIS — Z8669 Personal history of other diseases of the nervous system and sense organs: Secondary | ICD-10-CM | POA: Diagnosis not present

## 2022-04-08 MED ORDER — FLUTICASONE FUROATE-VILANTEROL 100-25 MCG/ACT IN AEPB: 1.0000 | INHALATION_SPRAY | Freq: Every day | RESPIRATORY_TRACT | 5 refills | Status: DC

## 2022-04-08 NOTE — Patient Instructions (Addendum)
Recommendations: - Continue Breo 1 puff daily every few day in the morning  - Use albuterol rescue inhaler 2 puffs every 4-6 hours as needed for breakthrough shortness of breath or wheezing - Please restart blood pressure and diuretic medications - Speak with PCP about depression - You are scheduled for a CAT scan on June 30 - We will follow-up with our patient care coordinator about home sleep study that was ordered back in April  Follow-up: - 3 months with Beth NO

## 2022-04-08 NOTE — Progress Notes (Signed)
$'@Patient'F$  ID: Danielle [name], female    DOB: May 02, 1971, 51 y.o.   MRN: [id number]  Chief Complaint  Patient presents with   Follow-up    Pt is here for follow up for wheezing. Pt states that the [facility] is working well for her with no issues. No word yet on sleep study per pt.     Referring provider: Trey Sailors, PA  HPI: 51 year old female, never smoked.  Past medical history significant for hypertension, asthma, GERD, cocaine abuse, depression, obesity.  Previous [facility] pulmonary encounter: 01/29/2022 Patient presents today for sleep consult. She had sleep study several years ago, results are not available in chart. Sounds like she had previously been on CPAP. She continues to have symptoms of snoring and daytime sleepiness. She has trouble falling asleep at night. She goes to bed at 4p and falls asleep around 8pm. She wakes up 2-3 times a night. She starts her day at 6am. She takes benadryl and tylenol pm to sleep.   Additionally she experiences dyspnea with exertion and occasional wheezing. She has used nebulizes in the past. Her weight tends to fluctuate. She had spirometry in October 2021 with asthma and allergy that showed moderate restriction without BD response.   Sleep questionnaire Symptoms- Hx sleep apnea. Daytime sleepiness, snoring Prior sleep study- 15 years ago  Bedtime- 4-8pm  Time to fall asleep- several hours  Nocturnal awakenings- 2-3 times  Out of bed/start of day- 6am Weight changes- fluctuates  Do you operate heavy machinery- No Do you currently wear CPAP- No Do you current wear oxygen- No [place]- 18   03/20/2022 Patient presents today for a 4 to 6-week follow-up sleep consult and dyspnea symptoms.  She has not had sleep study yet.  Dyspnea symptoms have been more prominent over the last 3 months, she cannot walk more than 150 to 200 feet without getting winded.  She experiences occasional wheezing.  She has tried [employer] in the past but did not like  powdered medication and having to rinse her mouth out after use.  She uses 3-4 pillows to bite her head at night while sleeping.  Pulmonary function testing today showed moderate restriction in lung function along with mild diffusion defect.  She has had no recent chest imaging. She had a chest x-ray in 2021 due to shortness of breath that did show mildly progressive cardiomegaly with mild interstitial pulmonary edema or interstitial pneumonitis..  We will order CT chest to assess for interstitial lung disease.   04/08/2022- Interim hx  Patient presents today for 3 week follow-up. During her last visit she was started on Breo.  Shortness of breath and wheezing are better. HRCT scan is scheduled for June 30th. She reports weight gain, she has not been taking her blood pressure medication or diuretics as she should. She will be seeing her primary care physcian tomorrow and will speak with them about her depression.    Pulmonary function testing 03/20/2022 FVC 2.36 (79%), FEV1 1.48 (62%), ratio 63, DLCOUNC 14.9 (69%) Moderate restriction/obstruction without bronchodilator response.  Mild diffusion defect.  03/20/2022 FENO>> 17   Allergies  Allergen Reactions   Percocet [Oxycodone-Acetaminophen] Nausea And Vomiting    Immunization History  Administered Date(s) Administered   Influenza,inj,Quad PF,6+ Mos 06/01/2017, 08/11/2018   PFIZER(Purple Top)SARS-COV-2 Vaccination 02/09/2020   Tdap 09/08/2018    Past Medical History:  Diagnosis Date   Arthritis    Asthma    Bipolar 1 disorder (HCC)    COPD (chronic obstructive pulmonary disease) (  [place])    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[place])    crack-cocaine-11/12 last    GERD (gastroesophageal reflux disease)    Hypertension    Migraines    Pre-diabetes    Sleep apnea    states had test-no cpap ordered-does snore    Tobacco History: Social History   Tobacco Use  Smoking Status Never  Smokeless Tobacco Never   Counseling given: Not  Answered   Outpatient Medications Prior to Visit  Medication Sig Dispense Refill   albuterol (PROVENTIL) (2.5 MG/3ML) 0.083% nebulizer solution Take 2.5 mg by nebulization every 6 (six) hours as needed for wheezing or shortness of breath.     albuterol (VENTOLIN HFA) 108 (90 Base) MCG/ACT inhaler Inhale 2 puffs into the lungs every 6 (six) hours as needed for wheezing or shortness of breath.     furosemide (LASIX) 20 MG tablet Take 20 mg by mouth every morning.     gabapentin (NEURONTIN) 300 MG capsule Take 1 capsule by mouth 3 (three) times daily.     hydrOXYzine (ATARAX/VISTARIL) 25 MG tablet Take 12.5-25 mg by mouth 3 (three) times daily as needed for anxiety. (Patient not taking: Reported on 04/23/2022)     omeprazole (PRILOSEC) 40 MG capsule Take 40 mg by mouth every morning.     sertraline (ZOLOFT) 50 MG tablet Take 50 mg by mouth at bedtime.     acetaminophen (TYLENOL) 650 MG CR tablet Take 650 mg by mouth every 8 (eight) hours as needed for pain.     BREO ELLIPTA 100-25 MCG/ACT AEPB Inhale 1 puff into the lungs daily.     carbamazepine (TEGRETOL XR) 100 MG 12 hr tablet Take 100 mg by mouth 2 (two) times daily.     diclofenac Sodium (VOLTAREN) 1 % GEL Apply 1 application. topically daily as needed (foot pain).     diphenhydramine-acetaminophen (TYLENOL PM) 25-500 MG TABS tablet Take 1 tablet by mouth at bedtime as needed.     famotidine (PEPCID) 10 MG tablet Take 10 mg by mouth daily as needed for heartburn or indigestion.     ferrous sulfate 325 (65 FE) MG tablet Take 325 mg by mouth daily with breakfast.     losartan (COZAAR) 25 MG tablet Take 25 mg by mouth daily.     Phenylephrine-APAP-guaiFENesin (TYLENOL SINUS SEVERE) 5-325-200 MG TABS Take 1 tablet by mouth daily as needed (sinus congestion).     RIVAROXABAN (XARELTO) VTE STARTER PACK (15 & 20 MG) Follow package directions: Take one '15mg'$  tablet by mouth twice a day. On day 22, switch to one '20mg'$  tablet once a day. Take with food. 51  each 0   SYMBICORT 160-4.5 MCG/ACT inhaler Inhale 2 puffs into the lungs in the morning and at bedtime. (Patient not taking: Reported on 04/08/2022)     No facility-administered medications prior to visit.   Review of Systems  Review of Systems  Constitutional:  Positive for unexpected weight change.  Respiratory:  Negative for cough, choking, chest tightness, shortness of breath and wheezing.   Cardiovascular:  Negative for leg swelling.  Psychiatric/Behavioral:  Positive for sleep disturbance.     Physical Exam  BP 126/68 (BP Location: Left Arm, Patient Position: Sitting, Cuff Size: Normal)   Pulse 77   Temp 98.2 F (36.8 C) (Oral)   Ht '5\' 4"'$  (1.626 m)   Wt (!) 302 lb (137 kg)   SpO2 98%   BMI 51.84 kg/m  Physical Exam Constitutional:      Appearance: Normal appearance.  She is obese.  HENT:     Head: Normocephalic and atraumatic.     Mouth/Throat:     Mouth: Mucous membranes are moist.     Pharynx: Oropharynx is clear.     Comments: Mallampati class III Cardiovascular:     Rate and Rhythm: Normal rate and regular rhythm.  Pulmonary:     Effort: Pulmonary effort is normal.     Breath sounds: Normal breath sounds. No wheezing, rhonchi or rales.  Musculoskeletal:        General: Normal range of motion.  Neurological:     General: No focal deficit present.     Mental Status: She is alert and oriented to person, place, and time. Mental status is at baseline.  Psychiatric:        Behavior: Behavior normal.        Thought Content: Thought content normal.        Judgment: Judgment normal.     Comments: Tearful at times      Lab Results:  CBC    Component Value Date/Time   WBC 6.2 04/28/2022 1308   RBC 4.24 04/28/2022 1308   HGB 11.0 (L) 04/28/2022 1308   HGB 7.9 (L) 01/09/2022 0909   HCT 34.4 (L) 04/28/2022 1308   HCT 26.9 (L) 01/15/2022 1255   PLT 426 (H) 04/28/2022 1308   PLT 426 08/29/2020 1120   MCV 81.1 04/28/2022 1308   MCV 82 08/29/2020 1120   MCH  25.9 (L) 04/28/2022 1308   MCHC 32.0 04/28/2022 1308   RDW 19.8 (H) 04/28/2022 1308   RDW 16.1 (H) 08/29/2020 1120   LYMPHSABS 2.9 01/15/2022 1234   LYMPHSABS 2.9 08/29/2020 1120   MONOABS 0.5 01/15/2022 1234   EOSABS 0.1 01/15/2022 1234   EOSABS 0.2 08/29/2020 1120   BASOSABS 0.1 01/15/2022 1234   BASOSABS 0.0 08/29/2020 1120    BMET    Component Value Date/Time   NA 136 04/28/2022 1308   NA 138 01/09/2022 0909   K 3.2 (L) 04/28/2022 1308   CL 104 04/28/2022 1308   CO2 25 04/28/2022 1308   GLUCOSE 112 (H) 04/28/2022 1308   BUN 11 04/28/2022 1308   BUN 13 01/09/2022 0909   CREATININE 0.77 04/28/2022 1308   CREATININE 0.80 01/15/2022 1234   CALCIUM 8.7 (L) 04/28/2022 1308   GFRNONAA >60 04/28/2022 1308   GFRNONAA >60 01/15/2022 1234   GFRAA 67 08/29/2020 1120    BNP No results found for: "BNP"  ProBNP    Component Value Date/Time   PROBNP 46 01/09/2022 0909    Imaging: No results found.   Assessment & Plan:   Asthma - Improved; Shortness of breath and wheezing are better since starting on maintenance ICS/LABA. Continue BREO 100 mcg one puff daily and Albuterol hfa 2 puffs every 4-6 hours as needed for breakthrough shortness of breath or wheezing  Snoring - Patient has snoring symptoms. Ordered for HST back in April, we will check on pending order and see if this can get scheduled.   DEPRESSION - Patient is tearful at times. Advised she speak with PCP about medication for depression   Martyn Ehrich, NP 05/18/2022

## 2022-04-09 ENCOUNTER — Telehealth: Payer: Self-pay | Admitting: Urology

## 2022-04-15 ENCOUNTER — Inpatient Hospital Stay: Payer: Medicare Other | Admitting: Hematology

## 2022-04-15 ENCOUNTER — Inpatient Hospital Stay: Payer: Medicare Other | Attending: Hematology

## 2022-04-17 ENCOUNTER — Ambulatory Visit: Payer: Medicare Other

## 2022-04-17 ENCOUNTER — Inpatient Hospital Stay: Admission: RE | Admit: 2022-04-17 | Payer: Medicare Other | Source: Ambulatory Visit

## 2022-04-23 ENCOUNTER — Inpatient Hospital Stay
Admission: RE | Admit: 2022-04-23 | Discharge: 2022-04-23 | Disposition: A | Discharge status: Home / Self Care | Payer: Medicare Other | Source: Ambulatory Visit

## 2022-04-23 NOTE — Patient Instructions (Addendum)
Your procedure is scheduled on: Friday May 01, 2022. Report to Day Surgery inside [facility] 2nd floor, stop by admissions desk before getting on elevator.  To find out your arrival time please call [phone] between 1PM - 3PM on Thursday April 30, 2022.  Remember: Instructions that are not followed completely may result in serious medical risk,  up to and including death, or upon the discretion of your surgeon and anesthesiologist your  surgery may need to be rescheduled.     _X__ 1. Do not eat food or drink fluids after midnight the night before your procedure.                 No chewing gum or hard candies.   __X__2.  On the morning of surgery brush your teeth with toothpaste and water, you                may rinse your mouth with mouthwash if you wish.  Do not swallow any toothpaste or mouthwash.     _X__ 3.  No Alcohol for 24 hours before or after surgery.   _X__ 4.  Do Not Smoke or use e-cigarettes For 24 Hours Prior to Your Surgery.                 Do not use any chewable tobacco products for at least 6 hours prior to                 Surgery.  _X__  5.  Do not use any recreational drugs (marijuana, cocaine, heroin, ecstasy, MDMA or other)                For at least one week prior to your surgery.  Combination of these drugs with anesthesia                May have life threatening results.  ____  6.  Bring all medications with you on the day of surgery if instructed.   __X__  7.  Notify your doctor if there is any change in your medical condition      (cold, fever, infections).     Do not wear jewelry, make-up, hairpins, clips or nail polish. Do not wear lotions, powders, or perfumes. You may wear deodorant. Do not shave 48 hours prior to surgery. Men may shave face and neck. Do not bring valuables to the hospital.    [facility] is not responsible for any belongings or valuables.  Contacts, dentures or bridgework may not be worn into  surgery. Leave your suitcase in the car. After surgery it may be brought to your room. For patients admitted to the hospital, discharge time is determined by your treatment team.   Patients discharged the day of surgery will not be allowed to drive home.   Make arrangements for someone to be with you for the first 24 hours of your Same Day Discharge.    __X__ Take these medicines the morning of surgery with A SIP OF WATER:    1. carbamazepine (TEGRETOL XR) 100 MG  2. famotidine (PEPCID) 10 MG tablet  3. gabapentin (NEURONTIN) 300 MG   4. hydrOXYzine (ATARAX/VISTARIL) 25 MG  5.  6.  ____ Fleet Enema (as directed)   __X__ Use CHG Soap (or wipes) as directed  ____ Use Benzoyl Peroxide Gel as instructed  ____ Use inhalers on the day of surgery  ____ Stop metformin 2 days prior to surgery    ____ Take 1/2 of  usual insulin dose the night before surgery. No insulin the morning          of surgery.   ____ Call your PCP, cardiologist, or Pulmonologist if taking Coumadin/Plavix/aspirin and ask when to stop before your surgery.   __X__ One Week prior to surgery- Stop Anti-inflammatories such as Ibuprofen, Aleve, Advil, Motrin, meloxicam (MOBIC), diclofenac, etodolac, ketorolac, Toradol, Daypro, piroxicam, Goody's or BC powders. OK TO USE TYLENOL IF NEEDED   __X__ Stop supplements until after surgery.    ____ Bring C-Pap to the hospital.    If you have any questions regarding your pre-procedure instructions,  Please call Pre-admit Testing at [phone]

## 2022-04-24 ENCOUNTER — Inpatient Hospital Stay: Admission: RE | Admit: 2022-04-24 | Payer: Medicare Other | Source: Ambulatory Visit

## 2022-04-27 ENCOUNTER — Encounter
Admission: RE | Admit: 2022-04-27 | Discharge: 2022-04-27 | Disposition: A | Discharge status: Home / Self Care | Payer: Medicare Other | Source: Ambulatory Visit | Attending: Podiatry | Admitting: Podiatry

## 2022-04-27 DIAGNOSIS — D500 Iron deficiency anemia secondary to blood loss (chronic): Secondary | ICD-10-CM

## 2022-04-27 DIAGNOSIS — Z79899 Other long term (current) drug therapy: Secondary | ICD-10-CM

## 2022-04-27 DIAGNOSIS — Z01818 Encounter for other preprocedural examination: Secondary | ICD-10-CM

## 2022-04-27 HISTORY — DX: Chronic obstructive pulmonary disease, unspecified: J44.9

## 2022-04-27 HISTORY — DX: Prediabetes: R73.03

## 2022-04-27 NOTE — Patient Instructions (Signed)
Your procedure is scheduled on:05-01-22 Friday Report to the Registration Desk on the 1st floor of the [facility]. Then proceed to the 2nd floor Surgery Desk  To find out your arrival time, please call [phone] between 1PM - 3PM on:04-30-22 Thursday If your arrival time is 6:00 am, do not arrive prior to that time as the [place] entrance doors do not open until 6:00 am.  REMEMBER: Instructions that are not followed completely may result in serious medical risk, up to and including death; or upon the discretion of your surgeon and anesthesiologist your surgery may need to be rescheduled.  Do not eat food OR drink any liquids after midnight the night before surgery.  No gum chewing, lozengers or hard candies.  TAKE THESE MEDICATIONS THE MORNING OF SURGERY WITH A SIP OF WATER: -gabapentin (NEURONTIN) -omeprazole (PRILOSEC)-take one the night before and one on the morning of surgery - helps to prevent nausea after surgery.)  Use your BREO ELLIPTA and your Albuterol Inhaler the day of surgery and bring your Albuterol Inhaler to the hospital  One week prior to surgery: Stop Anti-inflammatories (NSAIDS) such as Advil, Aleve, Ibuprofen, Motrin, Naproxen, Naprosyn and Aspirin based products such as Excedrin, Goodys Powder, BC Powder.You may however, take Tylenol if needed for pain up until the day of surgery.  Stop ANY OVER THE COUNTER supplements/vitamins NOW (04-27-22) until after surgery.  No Alcohol for 24 hours before or after surgery.  No Smoking including e-cigarettes for 24 hours prior to surgery.  No chewable tobacco products for at least 6 hours prior to surgery.  No nicotine patches on the day of surgery.  Do not use any "recreational" drugs for at least a week prior to your surgery.  Please be advised that the combination of cocaine and anesthesia may have negative outcomes, up to and including death. If you test positive for cocaine, your surgery will be cancelled.  On  the morning of surgery brush your teeth with toothpaste and water, you may rinse your mouth with mouthwash if you wish. Do not swallow any toothpaste or mouthwash.  Use CHG Soap as directed on instruction sheet.  Do not wear jewelry, make-up, hairpins, clips or nail polish.  Do not wear lotions, powders, or perfumes.   Do not shave body from the neck down 48 hours prior to surgery just in case you cut yourself which could leave a site for infection.  Also, freshly shaved skin may become irritated if using the CHG soap.  Contact lenses, hearing aids and dentures may not be worn into surgery.  Do not bring valuables to the hospital. [facility] is not responsible for any missing/lost belongings or valuables.   Notify your doctor if there is any change in your medical condition (cold, fever, infection).  Wear comfortable clothing (specific to your surgery type) to the hospital.  After surgery, you can help prevent lung complications by doing breathing exercises.  Take deep breaths and cough every 1-2 hours. Your doctor may order a device called an Incentive Spirometer to help you take deep breaths. When coughing or sneezing, hold a pillow firmly against your incision with both hands. This is called "splinting." Doing this helps protect your incision. It also decreases belly discomfort.  If you are being admitted to the hospital overnight, leave your suitcase in the car. After surgery it may be brought to your room.  If you are being discharged the day of surgery, you will not be allowed to drive home. You  will need a responsible adult (18 years or older) to drive you home and stay with you that night.   If you are taking public transportation, you will need to have a responsible adult (18 years or older) with you. Please confirm with your physician that it is acceptable to use public transportation.   Please call the [place] Dept. at [phone] if you have any  questions about these instructions.  Surgery Visitation Policy:  Patients undergoing a surgery or procedure may have two family members or support persons with them as long as the person is not COVID-19 positive or experiencing its symptoms.                               Your procedure is scheduled on: Report to the Registration Desk on the 1st floor of the [facility]. To find out your arrival time, please call [phone] between 1PM - 3PM on: If your arrival time is 6:00 am, do not arrive prior to that time as the [facility] entrance doors do not open until 6:00 am.  REMEMBER: Instructions that are not followed completely may result in serious medical risk, up to and including death; or upon the discretion of your surgeon and anesthesiologist your surgery may need to be rescheduled.  Do not eat food after midnight the night before surgery.  No gum chewing, lozengers or hard candies.  You may however, drink CLEAR liquids up to 2 hours before you are scheduled to arrive for your surgery. Do not drink anything within 2 hours of your scheduled arrival time.  Clear liquids include: - water  - apple juice without pulp - gatorade (not RED colors) - black coffee or tea (Do NOT add milk or creamers to the coffee or tea) Do NOT drink anything that is not on this list.  Type 1 and Type 2 diabetics should only drink water.  In addition, your doctor has ordered for you to drink the provided  Ensure Pre-Surgery Clear Carbohydrate Drink  Gatorade G2 Drinking this carbohydrate drink up to two hours before surgery helps to reduce insulin resistance and improve patient outcomes. Please complete drinking 2 hours prior to scheduled arrival time.  TAKE THESE MEDICATIONS THE MORNING OF SURGERY WITH A SIP OF WATER:  (take one the night before and one on the morning of surgery - helps to prevent nausea after surgery.)  Use inhalers on the day of surgery and bring to  the hospital.  **Follow new guidelines for insulin and diabetes medications.**  Follow recommendations from Cardiologist, Pulmonologist or PCP regarding stopping Aspirin, Coumadin, Plavix, Eliquis, Pradaxa, or Pletal.  One week prior to surgery: Stop Anti-inflammatories (NSAIDS) such as Advil, Aleve, Ibuprofen, Motrin, Naproxen, Naprosyn and Aspirin based products such as Excedrin, Goodys Powder, BC Powder. Stop ANY OVER THE COUNTER supplements until after surgery. You may however, continue to take Tylenol if needed for pain up until the day of surgery.  No Alcohol for 24 hours before or after surgery.  No Smoking including e-cigarettes for 24 hours prior to surgery.  No chewable tobacco products for at least 6 hours prior to surgery.  No nicotine patches on the day of surgery.  Do not use any "recreational" drugs for at least a week prior to your surgery.  Please be advised that the combination of cocaine and anesthesia may have negative outcomes, up to and including death. If you test positive for cocaine,  your surgery will be cancelled.  On the morning of surgery brush your teeth with toothpaste and water, you may rinse your mouth with mouthwash if you wish. Do not swallow any toothpaste or mouthwash.  Use CHG Soap or wipes as directed on instruction sheet.  Do not wear jewelry, make-up, hairpins, clips or nail polish.  Do not wear lotions, powders, or perfumes.   Do not shave body from the neck down 48 hours prior to surgery just in case you cut yourself which could leave a site for infection.  Also, freshly shaved skin may become irritated if using the CHG soap.  Contact lenses, hearing aids and dentures may not be worn into surgery.  Do not bring valuables to the hospital. [facility] is not responsible for any missing/lost belongings or valuables.   Total Shoulder Arthroplasty:  use Benzolyl Peroxide 5% Gel as directed on instruction sheet.  Fleets enema or bowel prep as  directed.  Bring your C-PAP to the hospital with you in case you may have to spend the night.   Notify your doctor if there is any change in your medical condition (cold, fever, infection).  Wear comfortable clothing (specific to your surgery type) to the hospital.  After surgery, you can help prevent lung complications by doing breathing exercises.  Take deep breaths and cough every 1-2 hours. Your doctor may order a device called an Incentive Spirometer to help you take deep breaths. When coughing or sneezing, hold a pillow firmly against your incision with both hands. This is called "splinting." Doing this helps protect your incision. It also decreases belly discomfort.  If you are being admitted to the hospital overnight, leave your suitcase in the car. After surgery it may be brought to your room.  If you are being discharged the day of surgery, you will not be allowed to drive home. You will need a responsible adult (18 years or older) to drive you home and stay with you that night.   If you are taking public transportation, you will need to have a responsible adult (18 years or older) with you. Please confirm with your physician that it is acceptable to use public transportation.   Please call the [place] Dept. at [phone] if you have any questions about these instructions.  Surgery Visitation Policy:  Patients undergoing a surgery or procedure may have two family members or support persons with them as long as the person is not COVID-19 positive or experiencing its symptoms.   Inpatient Visitation:    Visiting hours are 7 a.m. to 8 p.m. Up to four visitors are allowed at one time in a patient room, including children. The visitors may rotate out with other people during the day. One designated support person (adult) may remain overnight.

## 2022-04-28 ENCOUNTER — Encounter
Admission: RE | Admit: 2022-04-28 | Discharge: 2022-04-28 | Disposition: A | Discharge status: Home / Self Care | Payer: Medicare Other | Source: Ambulatory Visit | Attending: Podiatry | Admitting: Podiatry

## 2022-04-28 DIAGNOSIS — Z01818 Encounter for other preprocedural examination: Secondary | ICD-10-CM

## 2022-04-28 DIAGNOSIS — Z01812 Encounter for preprocedural laboratory examination: Secondary | ICD-10-CM | POA: Insufficient documentation

## 2022-04-28 DIAGNOSIS — Z79899 Other long term (current) drug therapy: Secondary | ICD-10-CM | POA: Insufficient documentation

## 2022-04-28 DIAGNOSIS — D500 Iron deficiency anemia secondary to blood loss (chronic): Secondary | ICD-10-CM | POA: Diagnosis not present

## 2022-04-28 LAB — BASIC METABOLIC PANEL
Anion gap: 7 (ref 5–15)
BUN: 11 mg/dL (ref 6–20)
CO2: 25 mmol/L (ref 22–32)
Calcium: 8.7 mg/dL — ABNORMAL LOW (ref 8.9–10.3)
Chloride: 104 mmol/L (ref 98–111)
Creatinine, Ser: 0.77 mg/dL (ref 0.44–1.00)
GFR, Estimated: >60 mL/min (ref >60)
Glucose, Bld: 112 mg/dL — ABNORMAL HIGH (ref 70–99)
Potassium: 3.2 mmol/L — ABNORMAL LOW (ref 3.5–5.1)
Sodium: 136 mmol/L (ref 135–145)

## 2022-04-28 LAB — CBC
HCT: 34.4 % — ABNORMAL LOW (ref 36.0–46.0)
Hemoglobin: 11 g/dL — ABNORMAL LOW (ref 12.0–15.0)
MCH: 25.9 pg — ABNORMAL LOW (ref 26.0–34.0)
MCHC: 32 g/dL (ref 30.0–36.0)
MCV: 81.1 fL (ref 80.0–100.0)
Platelets: 426 10*3/uL — ABNORMAL HIGH (ref 150–400)
RBC: 4.24 MIL/uL (ref 3.87–5.11)
RDW: 19.8 % — ABNORMAL HIGH (ref 11.5–15.5)
WBC: 6.2 10*3/uL (ref 4.0–10.5)
nRBC: 0 % (ref 0.0–0.2)

## 2022-04-29 ENCOUNTER — Encounter (HOSPITAL_COMMUNITY): Payer: Self-pay | Admitting: Urgent Care

## 2022-05-01 ENCOUNTER — Encounter: Admission: RE | Payer: Self-pay | Source: Home / Self Care

## 2022-05-01 ENCOUNTER — Ambulatory Visit: Admission: RE | Admit: 2022-05-01 | Payer: Medicare Other | Source: Home / Self Care | Admitting: Podiatry

## 2022-05-01 SURGERY — REMOVAL, HARDWARE
Anesthesia: Choice | Laterality: Right

## 2022-05-11 ENCOUNTER — Encounter: Payer: Medicare Other | Admitting: Podiatry

## 2022-05-18 ENCOUNTER — Telehealth: Payer: Self-pay | Admitting: Primary Care

## 2022-05-18 ENCOUNTER — Encounter: Payer: Medicare Other | Admitting: Podiatry

## 2022-05-18 NOTE — Assessment & Plan Note (Signed)
-   Improved; Shortness of breath and wheezing are better since starting on maintenance ICS/LABA. Continue BREO 100 mcg one puff daily and Albuterol hfa 2 puffs every 4-6 hours as needed for breakthrough shortness of breath or wheezing

## 2022-05-18 NOTE — Telephone Encounter (Signed)
Was Danielle [name] HST ever scheduled, originally ordered back in April

## 2022-05-18 NOTE — Assessment & Plan Note (Signed)
-   Patient has snoring symptoms. Ordered for HST back in April, we will check on pending order and see if this can get scheduled.

## 2022-05-18 NOTE — Assessment & Plan Note (Signed)
-   Patient is tearful at times. Advised she speak with PCP about medication for depression

## 2022-05-19 NOTE — Telephone Encounter (Signed)
Thank you so much Collette

## 2022-05-19 NOTE — Telephone Encounter (Signed)
Noted, thanks again. I will be seeing her in September- if unable to contact I can have someone speak with her during that visit

## 2022-05-25 ENCOUNTER — Encounter: Payer: Self-pay | Admitting: Primary Care

## 2022-06-01 ENCOUNTER — Encounter: Payer: Medicare Other | Admitting: Podiatry

## 2022-06-07 ENCOUNTER — Other Ambulatory Visit: Payer: Self-pay | Admitting: Cardiology

## 2022-06-07 DIAGNOSIS — R072 Precordial pain: Secondary | ICD-10-CM

## 2022-06-07 DIAGNOSIS — Z01818 Encounter for other preprocedural examination: Secondary | ICD-10-CM

## 2022-06-07 NOTE — Progress Notes (Signed)
Documentation:  Requested to provide preoperative risk stratification for her upcoming hardware removal, right foot excision [place] right posterior ankle.   Last seen in the office in March 2023.  Diagnostic testing that was requested at that time is still not complete.  At the last office visit she was complaining of chest pain likely secondary to vasospasm due to cocaine use.  Recommended better blood pressure management and complete cessation of cocaine and then reevaluate with regards to ischemic work-up.  Given the fact that her overall functional capacity is unknown.  We will proceed with pharmacological stress test prior to her upcoming surgery.  Please note due to her BMI (of 55) she is unable to exercise and therefore pharmacological stress test is requested.  We will proceed with a 2-day protocol.Mechele Claude, [facility]  Pager: [phone] Office: [phone]

## 2022-06-15 ENCOUNTER — Other Ambulatory Visit: Payer: Medicare Other

## 2022-06-17 ENCOUNTER — Other Ambulatory Visit: Payer: Medicare Other

## 2022-06-23 ENCOUNTER — Other Ambulatory Visit: Payer: Medicare Other

## 2022-06-23 ENCOUNTER — Ambulatory Visit: Payer: Medicare Other | Admitting: Cardiology

## 2022-07-03 ENCOUNTER — Other Ambulatory Visit: Payer: Self-pay | Admitting: Family Medicine

## 2022-07-03 DIAGNOSIS — Z1231 Encounter for screening mammogram for malignant neoplasm of breast: Secondary | ICD-10-CM

## 2022-07-07 ENCOUNTER — Ambulatory Visit (INDEPENDENT_AMBULATORY_CARE_PROVIDER_SITE_OTHER): Payer: Medicare Other | Admitting: Podiatry

## 2022-07-07 ENCOUNTER — Ambulatory Visit: Payer: Medicare Other | Admitting: Cardiology

## 2022-07-07 DIAGNOSIS — M7751 Other enthesopathy of right foot: Secondary | ICD-10-CM | POA: Diagnosis not present

## 2022-07-07 MED ORDER — BETAMETHASONE SOD PHOS & ACET 6 (3-3) MG/ML IJ SUSP
3.0000 mg | Freq: Once | INTRAMUSCULAR | Status: AC
  Administered 2022-07-07: 3 mg via INTRA_ARTICULAR

## 2022-07-07 NOTE — Progress Notes (Signed)
   Chief Complaint  Patient presents with   surgery consult    Patient is here for surgical consult, patient states that she  is ready to schedule her surgery for right foot.    HPI: 51 y.o. female presenting today for follow-up evaluation of chronic right ankle pain secondary to history of fracture.  Patient is not a surgical candidate due to complicated medical history.  Surgery has been canceled in the past due to high risk from comorbidities.  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    GERD (gastroesophageal reflux disease)    Hypertension    Migraines    Pre-diabetes    Sleep apnea    states had test-no cpap ordered-does snore    Past Surgical History:  Procedure Laterality Date   BIOPSY  03/08/2020   Procedure: BIOPSY;  Surgeon: [name], MD;  Location: [facility];  Service: Gastroenterology;;   COLONOSCOPY WITH PROPOFOL N/A 03/08/2020   Procedure: COLONOSCOPY WITH PROPOFOL;  Surgeon: [name], MD;  Location: [facility];  Service: Gastroenterology;  Laterality: N/A;   ESOPHAGOGASTRODUODENOSCOPY (EGD) WITH PROPOFOL N/A 03/08/2020   Procedure: ESOPHAGOGASTRODUODENOSCOPY (EGD) WITH PROPOFOL;  Surgeon: [name], MD;  [facility];  Service: Gastroenterology;  Laterality: N/A;   FRACTURE SURGERY     rt femer/knee-plate-screws-   ORIF ANKLE FRACTURE  03/16/2012   Procedure: OPEN REDUCTION INTERNAL FIXATION (ORIF) ANKLE FRACTURE;  Surgeon: Colin Rhein, MD;  Location: [facility];  Service: Orthopedics;  Laterality: Right;  right lateral malleolus fracture    Allergies  Allergen Reactions   Percocet [Oxycodone-Acetaminophen] Nausea And Vomiting     Physical Exam: General: The patient is alert and oriented x3 in no  acute distress.  Dermatology: Skin is warm, dry and supple bilateral lower extremities. Negative for open lesions or macerations.  Vascular: Palpable pedal pulses bilaterally. Capillary refill within normal limits.  Negative for any significant edema or erythema  Neurological: Light touch and protective threshold grossly intact  Musculoskeletal Exam: There continues to be pain on palpation along the hardware of the fibula right ankle.  Also pain with plantarflexion and the posterior aspect of the ankle consistent with a symptomatic os trigonum noted from previous x-rays  Assessment: 1.  Capsulitis/DJD RT ankle   Plan of Care:  1. Patient evaluated. 2.  Injection of 0.5 cc Celestone Soluspan injected to the posterior aspect of the RT ankle 3.  Tri-Lock ankle brace dispensed.  Wear daily with good supportive shoes and sneakers 4.  Return to clinic as needed     Edrick Kins, DPM [facility]  Dr. Edrick Kins, DPM    [address], [place] [address]                Office [phone]  Fax [phone]

## 2022-07-09 ENCOUNTER — Ambulatory Visit: Payer: Medicare Other | Admitting: Primary Care

## 2022-07-09 NOTE — Progress Notes (Deleted)
$'@Patient'o$  ID: Danielle [name], female    DOB: 14-Oct-1971, 51 y.o.   MRN: [id number]  No chief complaint on file.   Referring provider: Trey Sailors, PA  HPI:  51 year old female, never smoked.  Past medical history significant for hypertension, asthma, GERD, cocaine abuse, depression, obesity.  Previous [facility] pulmonary encounter: 01/29/2022 Patient presents today for sleep consult. She had sleep study several years ago, results are not available in chart. Sounds like she had previously been on CPAP. She continues to have symptoms of snoring and daytime sleepiness. She has trouble falling asleep at night. She goes to bed at 4p and falls asleep around 8pm. She wakes up 2-3 times a night. She starts her day at 6am. She takes benadryl and tylenol pm to sleep.   Additionally she experiences dyspnea with exertion and occasional wheezing. She has used nebulizes in the past. Her weight tends to fluctuate. She had spirometry in October 2021 with asthma and allergy that showed moderate restriction without BD response.   Sleep questionnaire Symptoms- Hx sleep apnea. Daytime sleepiness, snoring Prior sleep study- 15 years ago  Bedtime- 4-8pm  Time to fall asleep- several hours  Nocturnal awakenings- 2-3 times  Out of bed/start of day- 6am Weight changes- fluctuates  Do you operate heavy machinery- No Do you currently wear CPAP- No Do you current wear oxygen- No [place]- 18   03/20/2022 Patient presents today for a 4 to 6-week follow-up sleep consult and dyspnea symptoms.  She has not had sleep study yet.  Dyspnea symptoms have been more prominent over the last 3 months, she cannot walk more than 150 to 200 feet without getting winded.  She experiences occasional wheezing.  She has tried [employer] in the past but did not like powdered medication and having to rinse her mouth out after use.  She uses 3-4 pillows to bite her head at night while sleeping.  Pulmonary function testing today showed  moderate restriction in lung function along with mild diffusion defect.  She has had no recent chest imaging. She had a chest x-ray in 2021 due to shortness of breath that did show mildly progressive cardiomegaly with mild interstitial pulmonary edema or interstitial pneumonitis..  We will order CT chest to assess for interstitial lung disease.   04/08/2022 Patient presents today for 3 week follow-up. During her last visit she was started on Breo. Shortness of breath and wheezing are better. HRCT scan is scheduled for June 30th. She reports weight gain, she has not been taking her blood pressure medication or diuretics as she should. She will be seeing her primary care physcian tomorrow and will speak with them about her depression.   07/09/2022 Patient presents today for 3 month follow-up.         Pulmonary function testing 03/20/2022 FVC 2.36 (79%), FEV1 1.48 (62%), ratio 63, DLCOUNC 14.9 (69%) Moderate restriction/obstruction without bronchodilator response.  Mild diffusion defect.  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     albuterol (VENTOLIN HFA) 108 (90 Base) MCG/ACT inhaler Inhale 2 puffs into the lungs every 6 (six) hours as needed for wheezing or shortness of breath.     fluticasone furoate-vilanterol (BREO ELLIPTA) 100-25 MCG/ACT AEPB Inhale 1 puff into the lungs daily. (Patient taking differently: Inhale 1 puff into the lungs every morning.) 30 each 5   furosemide (LASIX) 20 MG tablet Take 20 mg by mouth every morning.     gabapentin (NEURONTIN) 300 MG capsule Take 1 capsule by mouth 3 (three) times daily.     hydrOXYzine (ATARAX/VISTARIL) 25 MG tablet Take 12.5-25 mg by mouth 3 (three) times daily as needed for anxiety.     Naproxen Sod-diphenhydrAMINE (ALEVE PM) 220-25 MG TABS Take 1 tablet by mouth at bedtime.     omeprazole (PRILOSEC) 40 MG capsule Take 40 mg by mouth every morning.     sertraline (ZOLOFT) 50 MG tablet Take 50 mg by mouth at bedtime.     No facility-administered medications prior to visit.      Review of Systems  Review of Systems   Physical Exam  There were no vitals taken for this visit. Physical Exam   Lab Results:  CBC    Component Value Date/Time   WBC 6.2 04/28/2022 1308   RBC 4.24 04/28/2022 1308   HGB 11.0 (L) 04/28/2022 1308   HGB 7.9 (L) 01/09/2022 0909   HCT 34.4 (L) 04/28/2022 1308   HCT 26.9 (L) 01/15/2022 1255   PLT 426 (H) 04/28/2022 1308   PLT 426 08/29/2020 1120   MCV 81.1 04/28/2022 1308   MCV 82 08/29/2020 1120   MCH 25.9 (L) 04/28/2022 1308   MCHC 32.0 04/28/2022 1308   RDW 19.8 (H) 04/28/2022 1308   RDW 16.1 (H) 08/29/2020 1120   LYMPHSABS 2.9 01/15/2022 1234   LYMPHSABS 2.9 08/29/2020 1120   MONOABS 0.5 01/15/2022 1234   EOSABS 0.1 01/15/2022 1234   EOSABS 0.2 08/29/2020 1120   BASOSABS 0.1 01/15/2022 1234   BASOSABS 0.0 08/29/2020 1120    BMET    Component Value Date/Time   NA 136 04/28/2022 1308    NA 138 01/09/2022 0909   K 3.2 (L) 04/28/2022 1308   CL 104 04/28/2022 1308   CO2 25 04/28/2022 1308   GLUCOSE 112 (H) 04/28/2022 1308   BUN 11 04/28/2022 1308   BUN 13 01/09/2022 0909   CREATININE 0.77 04/28/2022 1308   CREATININE 0.80 01/15/2022 1234   CALCIUM 8.7 (L) 04/28/2022 1308   GFRNONAA >60 04/28/2022 1308   GFRNONAA >60 01/15/2022 1234   GFRAA 67 08/29/2020 1120    BNP No results found for: "BNP"  ProBNP    Component Value Date/Time   PROBNP 46 01/09/2022 0909    Imaging: No results found.   Assessment & Plan:   No problem-specific Assessment & Plan notes found for this encounter.     Martyn Ehrich, NP 07/09/2022

## 2022-10-01 ENCOUNTER — Ambulatory Visit (INDEPENDENT_AMBULATORY_CARE_PROVIDER_SITE_OTHER): Payer: Medicare Other | Admitting: Orthopaedic Surgery

## 2022-10-01 ENCOUNTER — Ambulatory Visit (INDEPENDENT_AMBULATORY_CARE_PROVIDER_SITE_OTHER): Payer: Medicare Other

## 2022-10-01 VITALS — Ht 65.0 in | Wt 303.4 lb

## 2022-10-01 DIAGNOSIS — M1711 Unilateral primary osteoarthritis, right knee: Secondary | ICD-10-CM | POA: Diagnosis not present

## 2022-10-01 DIAGNOSIS — Z6841 Body Mass Index (BMI) 40.0 and over, adult: Secondary | ICD-10-CM

## 2022-10-01 MED ORDER — DICLOFENAC SODIUM 75 MG PO TBEC: 75.0000 mg | DELAYED_RELEASE_TABLET | Freq: Two times a day (BID) | ORAL | 2 refills | Status: DC

## 2022-10-01 NOTE — Addendum Note (Signed)
Addended by: Webb Silversmith on: 10/01/2022 09:01 AM   Modules accepted: Orders

## 2022-10-01 NOTE — Progress Notes (Signed)
Office Visit Note   [name]           Date of Birth: [birth year]           MRN: [id number] Visit Date: 10/01/2022              Requested by: Trey Sailors, PA [address] [place],  [place] [address] PCP: Trey Sailors, PA   Assessment & Plan: Visit Diagnoses:  1. Primary osteoarthritis of right knee   2. 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  Patient's BMI is also greater than 50.  Disease process explained and treatment options were reviewed to include weight loss, cortisone injection, medications, knee brace as well as other treatments.  Based on options she would like to try an oral anti-inflammatory and she will pick up an brace.  We will make a referral to weight loss clinic.  Follow-up as needed.  Follow-Up Instructions: No follow-ups on file.   Orders:  Orders Placed This Encounter  Procedures   XR KNEE 3 VIEW RIGHT   Meds ordered this encounter  Medications   diclofenac (VOLTAREN) 75 MG EC tablet    Sig: Take 1 tablet (75 mg total) by mouth 2 (two) times daily.    Dispense:  30 tablet    Refill:  2      Procedures: No procedures performed   Clinical Data: No additional findings.   Subjective: Chief Complaint  Patient presents with   Right Knee - Pain    HPI Ms. Glade is a 51 year old female here for evaluation of chronic right knee pain for a long time.  She is 10 years status post ORIF right tibial plateau fracture by Dr. Beola Cord.  Tylenol does not help.  She has pain and swelling and difficulty getting up.  Denies any recent injuries. Review of Systems  Constitutional: Negative.   HENT: Negative.    Eyes: Negative.   Respiratory: Negative.    Cardiovascular: Negative.   Endocrine: Negative.   Musculoskeletal: Negative.   Neurological: Negative.   Hematological: Negative.   Psychiatric/Behavioral: Negative.    All  other systems reviewed and are negative.    Objective: Vital Signs: There were no vitals taken for this visit.  Physical Exam Vitals and nursing note reviewed.  Constitutional:      Appearance: She is well-developed.  HENT:     Head: Atraumatic.     Nose: Nose normal.  Eyes:     Extraocular Movements: Extraocular movements intact.  Cardiovascular:     Pulses: Normal pulses.  Pulmonary:     Effort: Pulmonary effort is normal.  Abdominal:     Palpations: Abdomen is soft.  Musculoskeletal:     Cervical back: Neck supple.  Skin:    General: Skin is warm.     Capillary Refill: Capillary refill takes less than 2 seconds.  Neurological:     Mental Status: She is alert. Mental status is at baseline.  Psychiatric:        Behavior: Behavior normal.        Thought Content: Thought content normal.        Judgment: Judgment normal.     Ortho Exam Exam nation of the right knee shows a fully healed surgical scar.  She has medial and lateral joint line tenderness.  Trace effusion.  2+ crepitus with range of motion.  Collaterals and cruciates are stable. Specialty Comments:  No specialty comments available.  Imaging: XR KNEE 3 VIEW RIGHT  Result Date: 10/01/2022 Three-view x-rays of the right knee show degenerative changes in all 3 compartments.  She is nearly bone-on-bone.  She has prior screw and plate fixation without any complications.    PMFS History: Patient Active Problem List   Diagnosis Date Noted   Snoring 03/20/2022   Hx of sleep apnea 03/02/2022   Iron deficiency anemia due to chronic blood loss 01/19/2022   Pruritus 08/15/2020   Asthma 08/15/2020   Heartburn 08/15/2020   Closed nondisplaced fracture of neck of fifth metacarpal bone of right hand with routine healing 12/13/2015   HYPERTENSION 11/12/2010   OBESITY 11/12/2010   COCAINE DEPENDENCE, IN REMISSION 11/12/2010   DEPRESSION 11/12/2010   GERD 11/12/2010   BACK PAIN 11/12/2010   Past Medical History:   Diagnosis Date   Arthritis    Asthma    Bipolar 1 disorder ([place])    COPD (chronic obstructive pulmonary disease) ([place])    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[place])    crack-cocaine-11/12 last    GERD (gastroesophageal reflux disease)    Hypertension    Migraines    Pre-diabetes    Sleep apnea    states had test-no cpap ordered-does snore    Family History  Problem Relation Age of Onset   Cancer Father    High blood pressure Father    Asthma Sister    Eczema Sister    SIDS Son    Allergic rhinitis Neg Hx    Urticaria Neg Hx     Past Surgical History:  Procedure Laterality Date   BIOPSY  03/08/2020   Procedure: BIOPSY;  Surgeon: [name], MD;  [facility];  Service: Gastroenterology;;   COLONOSCOPY WITH PROPOFOL N/A 03/08/2020   Procedure: COLONOSCOPY WITH PROPOFOL;  Surgeon: [name], MD;  [facility];  Service: Gastroenterology;  Laterality: N/A;   ESOPHAGOGASTRODUODENOSCOPY (EGD) WITH PROPOFOL N/A 03/08/2020   Procedure: ESOPHAGOGASTRODUODENOSCOPY (EGD) WITH PROPOFOL;  Surgeon: [name], MD;  [facility];  Service: Gastroenterology;  Laterality: N/A;   FRACTURE SURGERY     rt femer/knee-plate-screws-   ORIF ANKLE FRACTURE  03/16/2012   Procedure: OPEN REDUCTION INTERNAL FIXATION (ORIF) ANKLE FRACTURE;  Surgeon: Colin Rhein, MD;  Location: [facility];  Service: Orthopedics;  Laterality: Right;  right lateral malleolus fracture   Social History   Occupational History   Not on file  Tobacco Use   Smoking status: Never   Smokeless tobacco: Never  Vaping Use   Vaping Use: Never used  Substance and Sexual Activity   Alcohol use: No    Comment: occasionally   Drug use: Not Currently    Types: "Crack" cocaine, Cocaine    Comment: remission 11/12   Sexual activity: Not on file

## 2022-10-02 ENCOUNTER — Encounter: Payer: Self-pay | Admitting: Podiatry

## 2022-10-02 ENCOUNTER — Ambulatory Visit (INDEPENDENT_AMBULATORY_CARE_PROVIDER_SITE_OTHER): Payer: Medicare Other | Admitting: Podiatry

## 2022-10-02 DIAGNOSIS — M7751 Other enthesopathy of right foot: Secondary | ICD-10-CM

## 2022-10-02 MED ORDER — TRIAMCINOLONE ACETONIDE 10 MG/ML IJ SUSP
10.0000 mg | Freq: Once | INTRAMUSCULAR | Status: AC
  Administered 2022-10-02: 10 mg

## 2022-10-04 NOTE — Progress Notes (Signed)
Subjective:   Patient ID: Danielle [name], female   DOB: 51 y.o.   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

## 2022-10-06 ENCOUNTER — Telehealth: Payer: Self-pay | Admitting: Orthopaedic Surgery

## 2022-10-06 ENCOUNTER — Other Ambulatory Visit: Payer: Self-pay | Admitting: Physician Assistant

## 2022-10-06 MED ORDER — MELOXICAM 7.5 MG PO TABS: 7.5000 mg | ORAL_TABLET | Freq: Every day | ORAL | 0 refills | Status: AC

## 2022-10-06 NOTE — Telephone Encounter (Signed)
Couldn't call patient. No callback number and message states that patient will call back with return number to hotel.

## 2022-10-06 NOTE — Telephone Encounter (Signed)
Pt called stating that she is having problems with one of the medications prescribed diclofenac. Pt states she would like Dr Erlinda Hong to prescribe a dose not as strong. Pt has no telephone number for call back. She is staying in a hotel and will call back with phone number. Her room number at hotel is [id number].

## 2022-10-06 NOTE — Telephone Encounter (Signed)
Sent in a small dose of mobic to try instead

## 2022-10-21 ENCOUNTER — Encounter (INDEPENDENT_AMBULATORY_CARE_PROVIDER_SITE_OTHER): Payer: Medicare Other | Admitting: Family Medicine

## 2022-11-12 ENCOUNTER — Other Ambulatory Visit: Payer: Self-pay | Admitting: Family Medicine

## 2022-11-12 DIAGNOSIS — Z1231 Encounter for screening mammogram for malignant neoplasm of breast: Secondary | ICD-10-CM

## 2022-12-08 ENCOUNTER — Encounter: Payer: Self-pay | Admitting: Hematology

## 2022-12-10 ENCOUNTER — Ambulatory Visit (INDEPENDENT_AMBULATORY_CARE_PROVIDER_SITE_OTHER): Payer: 59 | Admitting: Podiatry

## 2022-12-10 ENCOUNTER — Encounter: Payer: Self-pay | Admitting: Hematology

## 2022-12-10 ENCOUNTER — Encounter: Payer: Self-pay | Admitting: Podiatry

## 2022-12-10 DIAGNOSIS — M216X1 Other acquired deformities of right foot: Secondary | ICD-10-CM | POA: Diagnosis not present

## 2022-12-10 DIAGNOSIS — Z01818 Encounter for other preprocedural examination: Secondary | ICD-10-CM

## 2022-12-10 DIAGNOSIS — T8484XA Pain due to internal orthopedic prosthetic devices, implants and grafts, initial encounter: Secondary | ICD-10-CM

## 2022-12-15 ENCOUNTER — Ambulatory Visit: Payer: 59 | Admitting: Cardiology

## 2022-12-15 ENCOUNTER — Encounter: Payer: Self-pay | Admitting: Cardiology

## 2022-12-15 VITALS — BP 150/92 | HR 71 | Resp 19 | Ht 65.0 in | Wt 315.8 lb

## 2022-12-15 DIAGNOSIS — D509 Iron deficiency anemia, unspecified: Secondary | ICD-10-CM

## 2022-12-15 DIAGNOSIS — Z01818 Encounter for other preprocedural examination: Secondary | ICD-10-CM

## 2022-12-15 DIAGNOSIS — F1411 Cocaine abuse, in remission: Secondary | ICD-10-CM

## 2022-12-15 DIAGNOSIS — F319 Bipolar disorder, unspecified: Secondary | ICD-10-CM

## 2022-12-15 DIAGNOSIS — R0602 Shortness of breath: Secondary | ICD-10-CM

## 2022-12-15 DIAGNOSIS — I10 Essential (primary) hypertension: Secondary | ICD-10-CM

## 2022-12-15 DIAGNOSIS — R072 Precordial pain: Secondary | ICD-10-CM

## 2022-12-15 MED ORDER — AMLODIPINE BESYLATE 10 MG PO TABS: 10.0000 mg | ORAL_TABLET | Freq: Every morning | ORAL | 0 refills | Status: DC

## 2022-12-15 NOTE — Progress Notes (Addendum)
ID:  DIAMONIQUE MOLLARD, DOB 1971/03/08, MRN [id number]  PCP:  Trey Sailors, PA  Cardiologist:  Rex Kras, DO, [facility]  (established care 01/06/2022)  Date: 12/15/22 Last Office Visit: 01/06/2022  Chief Complaint  Patient presents with   Shortness of Breath   Follow-up    6 weeks & clearance    HPI  Danielle [name] is a 52 y.o. African-American female whose past medical history and cardiovascular risk factors include: Hypertension, asthma, prediabetes, GERD, bipolar, cocaine use, obesity due to excess calories.   Patient presents today for preoperative risk stratification prior to upcoming foot surgery on February 08, 2023 with Dr. Posey Pronto at [facility].  She was last seen in the office in March 2023 and then lost to follow-up at that time she was recommended to have an echo and stress test.  As part of preoperative questioning patient complains of chest pain, left anterior chest wall, intensity 6 out of 10, at least once a week, lasting for seconds to minutes, at times brought on by effort related activities, relieved with massaging the area and/or resting.  She describes the sensation as a pulled muscle and is more pronounced compared to her visit back in March 2023.  She has stopped using cocaine 1.5 months ago.  Blood pressures at today's office visit are not well-controlled.  Home blood pressures are also high.  She now follows with [facility].  For reasons unknown she is currently not on blood pressure medications.   FUNCTIONAL STATUS: No structured exercise program or daily routine.   ALLERGIES: Allergies  Allergen Reactions   Percocet [Oxycodone-Acetaminophen] Nausea And Vomiting    MEDICATION LIST PRIOR TO VISIT: Current Meds  Medication Sig   albuterol (PROVENTIL) (2.5 MG/3ML) 0.083% nebulizer solution Take 2.5 mg by nebulization every 6 (six) hours as needed for wheezing or shortness of breath.   albuterol (VENTOLIN HFA) 108  (90 Base) MCG/ACT inhaler Inhale 2 puffs into the lungs every 6 (six) hours as needed for wheezing or shortness of breath.   amLODipine (NORVASC) 10 MG tablet Take 1 tablet (10 mg total) by mouth every morning.   carbamazepine (TEGRETOL XR) 100 MG 12 hr tablet Take 100 mg by mouth 2 (two) times daily.   diclofenac (VOLTAREN) 75 MG EC tablet Take 1 tablet (75 mg total) by mouth 2 (two) times daily.   fluticasone furoate-vilanterol (BREO ELLIPTA) 100-25 MCG/ACT AEPB Inhale 1 puff into the lungs daily. (Patient taking differently: Inhale 1 puff into the lungs every morning.)   Naproxen Sod-diphenhydrAMINE (ALEVE PM) 220-25 MG TABS Take 1 tablet by mouth at bedtime.   omeprazole (PRILOSEC) 40 MG capsule Take 40 mg by mouth every morning.   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-11/12 last    GERD (gastroesophageal reflux disease)    Hypertension    Migraines    Pre-diabetes    Sleep apnea    states had test-no cpap ordered-does snore    PAST SURGICAL HISTORY: Past Surgical History:  Procedure Laterality Date   BIOPSY  03/08/2020   Procedure: BIOPSY;  Surgeon: [name], MD;  Location: [facility];  Service: Gastroenterology;;   COLONOSCOPY WITH PROPOFOL N/A  03/08/2020   Procedure: COLONOSCOPY WITH PROPOFOL;  Surgeon: [name], MD;  Location: [facility];  Service: Gastroenterology;  Laterality: N/A;   ESOPHAGOGASTRODUODENOSCOPY (EGD) WITH PROPOFOL N/A 03/08/2020   Procedure: ESOPHAGOGASTRODUODENOSCOPY (EGD) WITH PROPOFOL;  Surgeon: [name], MD;  Location: [facility];  Service: Gastroenterology;  Laterality: N/A;   FRACTURE SURGERY     rt femer/knee-plate-screws-   ORIF ANKLE FRACTURE  03/16/2012   Procedure: OPEN REDUCTION  INTERNAL FIXATION (ORIF) ANKLE FRACTURE;  Surgeon: Colin Rhein, MD;  Location: [place];  Service: Orthopedics;  Laterality: Right;  right lateral malleolus fracture    FAMILY HISTORY: The patient family history includes Asthma in her sister; Cancer in her father; Eczema in her sister; High blood pressure in her father; SIDS in her son.  SOCIAL HISTORY:  The patient  reports that she has never smoked. She has never used smokeless tobacco. She reports that she does not currently use drugs after having used the following drugs: "Crack" cocaine and Cocaine. She reports that she does not drink alcohol.  REVIEW OF SYSTEMS: Review of Systems  Cardiovascular:  Positive for chest pain and dyspnea on exertion. Negative for leg swelling, orthopnea, palpitations, paroxysmal nocturnal dyspnea and syncope.  Respiratory:  Positive for shortness of breath.   Gastrointestinal:  Positive for hemorrhoids.  Genitourinary:        Heavy menstrual cycles at times.   Neurological:  Positive for vertigo.    PHYSICAL EXAM:    12/15/2022    1:30 PM 10/01/2022    8:59 AM 04/23/2022    3:00 PM  Vitals with BMI  Height '5\' 5"'$  '5\' 5"'$  '5\' 4"'$   Weight 315 lbs 13 oz 303 lbs 6 oz 293 lbs  BMI 52.55 [id number]  Systolic Q000111Q    Diastolic 92    Pulse 71     Physical Exam  Constitutional: No distress.  Age appropriate, hemodynamically stable.   Neck: No JVD present.  Cardiovascular: Normal rate, regular rhythm, S1 normal, S2 normal, intact distal pulses and normal pulses. Exam reveals no gallop, no S3 and no S4.  No murmur heard. Pulmonary/Chest: Effort normal and breath sounds normal. No stridor. She has no wheezes. She has no rales.  Abdominal: Soft. Bowel sounds are normal. She exhibits no distension. There is no abdominal tenderness.  Musculoskeletal:        General: No edema.     Cervical back: Neck supple.  Neurological: She is alert and oriented to person, place, and time. She has intact  cranial nerves (2-12).  Skin: Skin is warm and moist.    CARDIAC DATABASE: EKG: 12/15/2022: Sinus rhythm, 64 bpm, without underlying ischemia or injury pattern.  No significant change compared to 01/06/2022.  Echocardiogram: 01/19/2020: 1. Left ventricular ejection fraction, by estimation, is 60 to 65%. The left ventricle has normal function. The left ventricle has no regional wall motion abnormalities. Left ventricular diastolic parameters were normal. 2. Right ventricular systolic function is normal. The right ventricular size is normal. Tricuspid regurgitation signal is inadequate for assessing PA pressure. 3. The mitral valve is normal in structure. No evidence of mitral valve regurgitation. No evidence of mitral stenosis. 4. The aortic valve is normal in structure. Aortic valve regurgitation is not visualized. No aortic stenosis is present. 5. The inferior vena cava is normal in size with greater than 50% respiratory variability, suggesting right atrial pressure of 3 mmHg.  01/13/2022:  Normal LV systolic function with visual EF 60-65%. Left ventricle cavity is normal  in size. Normal left ventricular wall thickness. Normal global wall motion. Indeterminate diastolic filling pattern, elevated LAP.  Mild (Grade I) mitral regurgitation.  Mild tricuspid regurgitation. Mild pulmonary hypertension. RVSP measures 37 mmHg.  IVC is dilated with a respiratory response of >50%. Estimated right atrial pressure 11mHg.  Compared to study 01/19/2020 Indeterminate diastolic dysfunction, elevated LAP, mild MR, mild TR, mild PHTN, estimated RAP 862mG are all new findings. Otherwise no significant change.    Stress Testing: No results found for this or any previous visit from the past 1095 days.   Heart Catheterization: None  LABORATORY DATA:    Latest Ref Rng & Units 04/28/2022    1:08 PM 01/15/2022   12:55 PM 01/15/2022   12:34 PM  CBC  WBC 4.0 - 10.5 K/uL 6.2   5.7   Hemoglobin 12.0 -  15.0 g/dL 11.0   7.6   Hematocrit 36.0 - 46.0 % 34.4  26.9  25.4   Platelets 150 - 400 K/uL 426   406        Latest Ref Rng & Units 04/28/2022    1:08 PM 01/15/2022   12:34 PM 01/09/2022    9:09 AM  CMP  Glucose 70 - 99 mg/dL 112  93  71   BUN 6 - 20 mg/dL '11  13  13   '$ Creatinine 0.44 - 1.00 mg/dL 0.77  0.80  0.86   Sodium 135 - 145 mmol/L 136  140  138   Potassium 3.5 - 5.1 mmol/L 3.2  4.1  4.7   Chloride 98 - 111 mmol/L 104  106  103   CO2 22 - 32 mmol/L '25  28  21   '$ Calcium 8.9 - 10.3 mg/dL 8.7  8.7  8.9   Total Protein 6.5 - 8.1 g/dL  7.9  8.0   Total Bilirubin 0.3 - 1.2 mg/dL  0.3  0.2   Alkaline Phos 38 - 126 U/L  77  97   AST 15 - 41 U/L  13  13   ALT 0 - 44 U/L  7  9     Lipid Panel     Component Value Date/Time   CHOL 175 01/09/2022 0909   TRIG 48 01/09/2022 0909   HDL 61 01/09/2022 0909   LDLCALC 104 (H) 01/09/2022 0909   LDLDIRECT 114 (H) 01/09/2022 0909   LABVLDL 10 01/09/2022 0909    No components found for: "NTPROBNP" Recent Labs    01/09/22 0909  PROBNP 46   No results for input(s): "TSH" in the last 8760 hours.  BMP Recent Labs    01/01/22 0901 01/09/22 0909 01/15/22 1234 04/28/22 1308  NA 141 138 140 136  K 3.5 4.7 4.1 3.2*  CL 103 103 106 104  CO2 '30 21 28 25  '$ GLUCOSE 82 71 93 112*  BUN '11 13 13 11  '$ CREATININE 0.78 0.86 0.80 0.77  CALCIUM 8.4* 8.9 8.7* 8.7*  GFRNONAA >60  --  >60 >60    HEMOGLOBIN A1C No results found for: "HGBA1C", "MPG"  External Labs:  Potassium: 4.1 Creatinine 0.94 mg/dL. eGFR: 73 mL/min per 1.73 m Hemoglobin: 8.6 g/dL and hematocrit: 28.6 % Total iron 28, iron capacity 486, iron saturation 6% Lipid profile: Total cholesterol 146 , triglycerides 87 , HDL 41 , LDL 87, non-HDL 105  IMPRESSION:    ICD-10-CM   1. Precordial pain  R07.2 NM PET CT CARDIAC PERFUSION MULTI W/ABSOLUTE BLOODFLOW    ECHOCARDIOGRAM COMPLETE    2. Pre-op evaluation  Z01.818 EKG 12-Lead    3. Benign hypertension  I10 amLODipine  (NORVASC) 10 MG tablet    4. Shortness of breath  R06.02     5. Iron deficiency anemia, unspecified iron deficiency anemia type  D50.9     6. Bipolar affective disorder  F31.9     7. Hx of cocaine abuse ([place])  F14.11        RECOMMENDATIONS: DEMARI BIRKS is a 52 y.o. African-American female whose past medical history and cardiac risk factors include: Hypertension, asthma, prediabetes, GERD, bipolar, cocaine use, obesity due to excess calories.   Precordial pain Pre-op evaluation Precordial pain has both cardiac and noncardiac features. EKG is nonischemic. Functional capacity is limited due to osteoarthritis of the knee as well as right ankle. Has multiple cardiovascular risk factors and therefore ischemic workup prior to upcoming noncardiac surgery is warranted. Echo will be ordered to evaluate for structural heart disease and left ventricular systolic function. Cardiac PET/CT to evaluate for reversible ischemia. Educated her on the importance of blood pressure management.  She will follow-up with her new PCP.  For now we will start amlodipine 10 mg p.o. daily.  Shared Decision Making/Informed Consent The risks [chest pain, shortness of breath, cardiac arrhythmias, dizziness, blood pressure fluctuations, myocardial infarction, stroke/transient ischemic attack, nausea, vomiting, allergic reaction, radiation exposure, metallic taste sensation and life-threatening complications (estimated to be 1 in 10,000)], benefits (risk stratification, diagnosing coronary artery disease, treatment guidance) and alternatives of a cardiac PET stress test were discussed in detail with Danielle [name] and she agrees to proceed.  Benign hypertension Not well-controlled. Currently not on pharmacological therapy Continues despite cessation of cocaine 1.5 months ago Due to upcoming noncardiac surgery adequate blood pressure management is quite important.  Will start amlodipine for now and she will  [facility]  Shortness of breath Multifactorial which includes but not limited to: Anemia, obesity, deconditioning, uncontrolled hypertension Blood pressure medications as discussed above. Ischemic workup as discussed above. Will defer noncardiac workup of her dyspnea to PCP  Patient states that she will discuss with PCP medication titration for her blood pressure, will have screening test for hyperlipidemia and diabetes.  FINAL MEDICATION LIST END OF ENCOUNTER: Meds ordered this encounter  Medications   amLODipine (NORVASC) 10 MG tablet    Sig: Take 1 tablet (10 mg total) by mouth every morning.    Dispense:  90 tablet    Refill:  0    Medications Discontinued During This Encounter  Medication Reason   furosemide (LASIX) 20 MG tablet Patient Preference   sertraline (ZOLOFT) 50 MG tablet Patient Preference   gabapentin (NEURONTIN) 300 MG capsule Patient Preference   hydrOXYzine (ATARAX/VISTARIL) 25 MG tablet Patient Preference   meloxicam (MOBIC) 15 MG tablet Patient Preference   Vitamin D, Ergocalciferol, (DRISDOL) 1.25 MG (50000 UNIT) CAPS capsule Patient Preference     Current Outpatient Medications:    albuterol (PROVENTIL) (2.5 MG/3ML) 0.083% nebulizer solution, Take 2.5 mg by nebulization every 6 (six) hours as needed for wheezing or shortness of breath., Disp: , Rfl:    albuterol (VENTOLIN HFA) 108 (90 Base) MCG/ACT inhaler, Inhale 2 puffs into the lungs every 6 (six) hours as needed for wheezing or shortness of breath., Disp: , Rfl:    amLODipine (NORVASC) 10 MG tablet, Take 1 tablet (10 mg total) by mouth every morning., Disp: 90 tablet, Rfl: 0   carbamazepine (TEGRETOL XR) 100 MG 12 hr tablet, Take 100 mg by mouth  2 (two) times daily., Disp: , Rfl:    diclofenac (VOLTAREN) 75 MG EC tablet, Take 1 tablet (75 mg total) by mouth 2 (two) times daily., Disp: 30 tablet, Rfl: 2   fluticasone furoate-vilanterol (BREO ELLIPTA) 100-25 MCG/ACT  AEPB, Inhale 1 puff into the lungs daily. (Patient taking differently: Inhale 1 puff into the lungs every morning.), Disp: 30 each, Rfl: 5   Naproxen Sod-diphenhydrAMINE (ALEVE PM) 220-25 MG TABS, Take 1 tablet by mouth at bedtime., Disp: , Rfl:    omeprazole (PRILOSEC) 40 MG capsule, Take 40 mg by mouth every morning., Disp: , Rfl:    SYMBICORT 160-4.5 MCG/ACT inhaler, SMARTSIG:2 Puff(s) By Mouth Twice Daily, Disp: , Rfl:    traMADol (ULTRAM) 50 MG tablet, Take 1-2 tablets (50-100 mg total) by mouth 3 (three) times daily as needed., Disp: 60 tablet, Rfl: 0  Orders Placed This Encounter  Procedures   NM PET CT CARDIAC PERFUSION MULTI W/ABSOLUTE BLOODFLOW   EKG 12-Lead   ECHOCARDIOGRAM COMPLETE    There are no Patient Instructions on file for this visit.   --Continue cardiac medications as reconciled in final medication list. --Return in about 2 months (around 02/17/2023) for Reevaluation of, Chest pain, Dyspnea. Or sooner if needed. --Continue follow-up with your primary care physician regarding the management of your other chronic comorbid conditions.  Patient's questions and concerns were addressed to her satisfaction. She voices understanding of the instructions provided during this encounter.   This note was created using a voice recognition software as a result there may be grammatical errors inadvertently enclosed that do not reflect the nature of this encounter. Every attempt is made to correct such errors.  Rex Kras, [place], [facility]  Pager: [phone] Office: [phone]

## 2022-12-16 ENCOUNTER — Other Ambulatory Visit: Payer: Self-pay | Admitting: Physician Assistant

## 2022-12-16 ENCOUNTER — Ambulatory Visit: Payer: Medicare Other | Admitting: Orthopaedic Surgery

## 2022-12-16 ENCOUNTER — Encounter: Payer: Self-pay | Admitting: Cardiology

## 2022-12-16 ENCOUNTER — Telehealth: Payer: Self-pay | Admitting: Orthopaedic Surgery

## 2022-12-16 MED ORDER — TRAMADOL HCL 50 MG PO TABS: 50.0000 mg | ORAL_TABLET | Freq: Three times a day (TID) | ORAL | 0 refills | Status: DC | PRN

## 2022-12-16 NOTE — Telephone Encounter (Signed)
Sent in tramadol.  Cannot do anything stronger

## 2022-12-16 NOTE — Telephone Encounter (Signed)
Pt called requesting a stronger medication for knee pain. She states she take gabapentin and it's not touching her pain. Please send medication to [facility].Pt phone number is [phone].

## 2022-12-16 NOTE — Telephone Encounter (Signed)
Tried to call patient. Sister answered the phone. She was unable to give her the phone. She will let her know that we called.

## 2022-12-17 NOTE — Progress Notes (Signed)
Subjective:  Patient ID: Danielle [name], female    DOB: 1970-11-17,  MRN: [id number]  Chief Complaint  Patient presents with   Ankle Pain    Follow up capsulitis ankle right   "Its still swelling and hurting. The swelling has started on top now. I think they want to do surgery, but the other doctor said my heart was bad"    52 y.o. female presents with the above complaint.  Patient presents with right ankle pain.  Patient states that the hardware has been painful.  She was scheduled for surgery to have the hardware removed however it was rescheduled because she had to get a cardiac clearance.  She wanted to get it evaluated she would like to reschedule the surgery she has not seen anyone else prior to seeing me for this.  Denies any other acute complaints.   Review of Systems: Negative except as noted in the HPI. Denies N/V/F/Ch.  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[place])    crack-cocaine-11/12 last    GERD (gastroesophageal reflux disease)    Hypertension    Migraines    Pre-diabetes    Sleep apnea    states had test-no cpap ordered-does snore    Current Outpatient Medications:    carbamazepine (TEGRETOL XR) 100 MG 12 hr tablet, Take 100 mg by mouth 2 (two) times daily., Disp: , Rfl:    SYMBICORT 160-4.5 MCG/ACT inhaler, SMARTSIG:2 Puff(s) By Mouth Twice Daily, Disp: , Rfl:    traMADol (ULTRAM) 50 MG tablet, Take 1-2 tablets (50-100 mg total) by mouth 3 (three) times daily as needed., Disp: 60 tablet, Rfl: 0   albuterol (PROVENTIL) (2.5 MG/3ML) 0.083% nebulizer solution, Take 2.5 mg by nebulization every 6 (six) hours as needed for wheezing or shortness of breath., Disp: , Rfl:    albuterol (VENTOLIN HFA) 108 (90 Base) MCG/ACT inhaler, Inhale 2 puffs into the lungs every 6 (six) hours as needed for wheezing or shortness of breath., Disp: ,  Rfl:    amLODipine (NORVASC) 10 MG tablet, Take 1 tablet (10 mg total) by mouth every morning., Disp: 90 tablet, Rfl: 0   diclofenac (VOLTAREN) 75 MG EC tablet, Take 1 tablet (75 mg total) by mouth 2 (two) times daily., Disp: 30 tablet, Rfl: 2   fluticasone furoate-vilanterol (BREO ELLIPTA) 100-25 MCG/ACT AEPB, Inhale 1 puff into the lungs daily. (Patient taking differently: Inhale 1 puff into the lungs every morning.), Disp: 30 each, Rfl: 5   Naproxen Sod-diphenhydrAMINE (ALEVE PM) 220-25 MG TABS, Take 1 tablet by mouth at bedtime., Disp: , Rfl:    omeprazole (PRILOSEC) 40 MG capsule, Take 40 mg by mouth every morning., Disp: , Rfl:   Social History   Tobacco Use  Smoking Status Never  Smokeless Tobacco Never    Allergies  Allergen Reactions   Percocet [Oxycodone-Acetaminophen] Nausea And Vomiting   Objective:  There were no vitals filed for this visit. There is no height or weight on file to calculate BMI. Constitutional Well developed. Well nourished.  Vascular Dorsalis pedis pulses palpable bilaterally. Posterior tibial pulses palpable bilaterally. Capillary refill normal to all digits.  No cyanosis or clubbing noted. Pedal hair growth normal.  Neurologic Normal speech. Oriented to person, place, and time. Epicritic sensation to light touch grossly present bilaterally.  Dermatologic Nails well groomed and normal in  appearance. No open wounds. No skin lesions.  Orthopedic: Pain on palpation to the right ankle pain on the lateral side of the foot.  Pain with ambulation in the ankle.  Pain and palpation to the previous hardware.  No open wounds or lesion noted.   Radiographs: 3 views of skeletally mature adult previous x-rays were reviewed.  Fibular hardware noted.  No abnormalities noted slight backing out noted but overall still within normal limits Assessment:   1. Painful orthopaedic hardware ([place])   2. Encounter for preoperative examination for general surgical procedure     Plan:  Patient was evaluated and treated and all questions answered.  Right painful orthopedic hardware -All questions and concerns were discussed with the patient in extensive detail.  She has tried conservative care and she was previously scheduled for surgery however given that she was not able to undergo due to cardiac clearance she may likely be need to done at the hospital as opposed to surgery center.  Will reattempt to get clearance and schedule her for surgical removal of hardware at the hospital -She will be weightbearing as tolerated in a surgical shoe -Informed surgical risk consent was reviewed and read aloud to the patient.  I reviewed the films.  I have discussed my findings with the patient in great detail.  I have discussed all risks including but not limited to infection, stiffness, scarring, limp, disability, deformity, damage to blood vessels and nerves, numbness, poor healing, need for braces, arthritis, chronic pain, amputation, death.  All benefits and realistic expectations discussed in great detail.  I have made no promises as to the outcome.  I have provided realistic expectations.  I have offered the patient a 2nd opinion, which they have declined and assured me they preferred to proceed despite the risks   No follow-ups on file.

## 2022-12-23 ENCOUNTER — Encounter: Payer: Medicare Other | Admitting: Obstetrics and Gynecology

## 2022-12-24 ENCOUNTER — Inpatient Hospital Stay (HOSPITAL_COMMUNITY): Admission: RE | Admit: 2022-12-24 | Payer: 59 | Source: Ambulatory Visit

## 2022-12-31 ENCOUNTER — Ambulatory Visit: Payer: Medicare Other

## 2023-01-08 ENCOUNTER — Telehealth: Payer: Self-pay | Admitting: Urology

## 2023-01-08 NOTE — Telephone Encounter (Signed)
DOS - 02/08/23  REMOVAL HARDWARE RIGHT --- [id number]  [facility] EFFECTIVE DATE 10/19/22  PER [employer] WEBSITE FOR CPT CODE 09811 Notification or Prior Authorization is not required for the requested services   Decision ID #: [id number]

## 2023-01-13 ENCOUNTER — Ambulatory Visit (HOSPITAL_COMMUNITY)
Admission: RE | Admit: 2023-01-13 | Discharge: 2023-01-13 | Disposition: A | Discharge status: Home / Self Care | Payer: 59 | Source: Ambulatory Visit | Attending: Cardiology | Admitting: Cardiology

## 2023-01-13 DIAGNOSIS — J449 Chronic obstructive pulmonary disease, unspecified: Secondary | ICD-10-CM | POA: Diagnosis not present

## 2023-01-13 DIAGNOSIS — I10 Essential (primary) hypertension: Secondary | ICD-10-CM | POA: Diagnosis not present

## 2023-01-13 DIAGNOSIS — E669 Obesity, unspecified: Secondary | ICD-10-CM | POA: Insufficient documentation

## 2023-01-13 DIAGNOSIS — R079 Chest pain, unspecified: Secondary | ICD-10-CM | POA: Insufficient documentation

## 2023-01-13 DIAGNOSIS — R072 Precordial pain: Secondary | ICD-10-CM

## 2023-01-13 LAB — ECHOCARDIOGRAM COMPLETE
Area-P 1/2: 2.33 cm2
Calc EF: 57.8 %
S' Lateral: 3.9 cm
Single Plane A2C EF: 58.5 %
Single Plane A4C EF: 58.4 %

## 2023-01-13 NOTE — Progress Notes (Signed)
Echocardiogram 2D Echocardiogram has been performed.  Frances Furbish 01/13/2023, 10:49 AM

## 2023-01-20 DIAGNOSIS — R072 Precordial pain: Secondary | ICD-10-CM | POA: Insufficient documentation

## 2023-01-21 NOTE — Progress Notes (Signed)
Appt moved to 4/18 at 1:45. Let her know all results would be reviewed at this appt.

## 2023-01-28 ENCOUNTER — Ambulatory Visit: Payer: 59 | Admitting: Cardiology

## 2023-01-28 ENCOUNTER — Telehealth (HOSPITAL_COMMUNITY): Payer: Self-pay | Admitting: Emergency Medicine

## 2023-01-28 ENCOUNTER — Encounter (HOSPITAL_COMMUNITY): Payer: Self-pay | Admitting: Urgent Care

## 2023-01-28 DIAGNOSIS — R072 Precordial pain: Secondary | ICD-10-CM

## 2023-01-28 DIAGNOSIS — Z01818 Encounter for other preprocedural examination: Secondary | ICD-10-CM

## 2023-01-29 ENCOUNTER — Telehealth (HOSPITAL_COMMUNITY): Payer: Self-pay | Admitting: *Deleted

## 2023-01-29 ENCOUNTER — Telehealth (HOSPITAL_COMMUNITY): Payer: Self-pay | Admitting: Emergency Medicine

## 2023-01-29 ENCOUNTER — Other Ambulatory Visit: Payer: Self-pay

## 2023-01-29 ENCOUNTER — Encounter
Admission: RE | Admit: 2023-01-29 | Discharge: 2023-01-29 | Disposition: A | Discharge status: Home / Self Care | Payer: 59 | Source: Ambulatory Visit | Attending: Podiatry | Admitting: Podiatry

## 2023-01-29 ENCOUNTER — Encounter: Payer: Self-pay | Admitting: Podiatry

## 2023-01-29 VITALS — Ht 64.0 in | Wt 313.0 lb

## 2023-01-29 DIAGNOSIS — N951 Menopausal and female climacteric states: Secondary | ICD-10-CM

## 2023-01-29 DIAGNOSIS — T8484XA Pain due to internal orthopedic prosthetic devices, implants and grafts, initial encounter: Secondary | ICD-10-CM

## 2023-01-29 DIAGNOSIS — D500 Iron deficiency anemia secondary to blood loss (chronic): Secondary | ICD-10-CM

## 2023-01-29 DIAGNOSIS — Z8669 Personal history of other diseases of the nervous system and sense organs: Secondary | ICD-10-CM

## 2023-01-29 HISTORY — DX: Anxiety disorder, unspecified: F41.9

## 2023-01-29 HISTORY — DX: Anemia, unspecified: D64.9

## 2023-01-29 MED ORDER — CEFAZOLIN (ANCEF) 1 G IV SOLR: 1.0000 g | INTRAVENOUS | Status: AC

## 2023-01-29 NOTE — Patient Instructions (Addendum)
Your procedure is scheduled on: Monday 02/08/23 To find out your arrival time, please call [phone] between 1PM - 3PM on:   Friday 02/05/23 Report to the Registration Desk on the 1st floor of the [facility]. Valet parking is available.  If your arrival time is 6:00 am, do not arrive before that time as the [facility] entrance doors do not open until 6:00 am.  REMEMBER: Instructions that are not followed completely may result in serious medical risk, up to and including death; or upon the discretion of your surgeon and anesthesiologist your surgery may need to be rescheduled.  Do not eat food or drink and liquids after midnight the night before surgery.  No gum chewing or hard candies.  One week prior to surgery: Stop Anti-inflammatories (NSAIDS) such as Advil, Aleve, Ibuprofen, Motrin, Naproxen, Naprosyn and Aspirin based products such as Excedrin, Goody's Powder, BC Powder. You may however, continue to take Tylenol if needed for pain up until the day of surgery.  Stop ANY OVER THE COUNTER supplements until after surgery.  Continue taking all prescribed medications.  TAKE ONLY THESE MEDICATIONS THE MORNING OF SURGERY WITH A SIP OF WATER:  amLODipine (NORVASC) 10 MG tablet  gabapentin (NEURONTIN) 300 MG capsule  omeprazole (PRILOSEC) 40 MG capsule Antacid (take one the night before and one on the morning of surgery - helps to prevent nausea after surgery.)  Use inhalers on the day of surgery and bring to the hospital.  No Alcohol for 24 hours before or after surgery.  No Smoking including e-cigarettes for 24 hours before surgery.  No chewable tobacco products for at least 6 hours before surgery.  No nicotine patches on the day of surgery.  Do not use any "recreational" drugs for at least a week (preferably 2 weeks) before your surgery.  Please be advised that the combination of cocaine and anesthesia may have negative outcomes, up to and including death. If you test  positive for cocaine, your surgery will be cancelled.  On the morning of surgery brush your teeth with toothpaste and water, you may rinse your mouth with mouthwash if you wish. Do not swallow any toothpaste or mouthwash.  Use CHG Soap or wipes as directed on instruction sheet.  Do not wear lotions, powders, or perfumes.   Do not shave body hair from the neck down 48 hours before surgery.  Wear comfortable clothing (specific to your surgery type) to the hospital.  Do not wear jewelry, make-up, hairpins, clips or nail polish.  Contact lenses, hearing aids and dentures may not be worn into surgery.  Do not bring valuables to the hospital. [facility] is not responsible for any missing/lost belongings or valuables.   Notify your doctor if there is any change in your medical condition (cold, fever, infection).  If you are being discharged the day of surgery, you will not be allowed to drive home. You will need a responsible individual to drive you home and stay with you for 24 hours after surgery.   If you are taking public transportation, you will need to have a responsible individual with you.  If you are being admitted to the hospital overnight, leave your suitcase in the car. After surgery it may be brought to your room.  In case of increased patient census, it may be necessary for you, the patient, to continue your postoperative care in the Same Day Surgery department.  After surgery, you can help prevent lung complications by doing breathing exercises.  Take  deep breaths and cough every 1-2 hours. Your doctor may order a device called an Incentive Spirometer to help you take deep breaths. When coughing or sneezing, hold a pillow firmly against your incision with both hands. This is called "splinting." Doing this helps protect your incision. It also decreases belly discomfort.  Surgery Visitation Policy:  Patients undergoing a surgery or procedure may have two family members or  support persons with them as long as the person is not COVID-19 positive or experiencing its symptoms.   Inpatient Visitation:    Visiting hours are 7 a.m. to 8 p.m. Up to four visitors are allowed at one time in a patient room. The visitors may rotate out with other people during the day. One designated support person (adult) may remain overnight.  Please call the Pre-admissions Testing Dept. at [phone] if you have any questions about these instructions.     Preparing for Surgery with CHLORHEXIDINE GLUCONATE (CHG) Soap  Chlorhexidine Gluconate (CHG) Soap  o An antiseptic cleaner that kills germs and bonds with the skin to continue killing germs even after washing  o Used for showering the night before surgery and morning of surgery  Before surgery, you can play an important role by reducing the number of germs on your skin.  CHG (Chlorhexidine gluconate) soap is an antiseptic cleanser which kills germs and bonds with the skin to continue killing germs even after washing.  Please do not use if you have an allergy to CHG or antibacterial soaps. If your skin becomes reddened/irritated stop using the CHG.  1. Shower the NIGHT BEFORE SURGERY and the MORNING OF SURGERY with CHG soap.  2. If you choose to wash your hair, wash your hair first as usual with your normal shampoo.  3. After shampooing, rinse your hair and body thoroughly to remove the shampoo.  4. Use CHG as you would any other liquid soap. You can apply CHG directly to the skin and wash gently with a scrungie or a clean washcloth.  5. Apply the CHG soap to your body only from the neck down. Do not use on open wounds or open sores. Avoid contact with your eyes, ears, mouth, and genitals (private parts). Wash face and genitals (private parts) with your normal soap.  6. Wash thoroughly, paying special attention to the area where your surgery will be performed.  7. Thoroughly rinse your body with warm water.  8. Do  not shower/wash with your normal soap after using and rinsing off the CHG soap.  9. Pat yourself dry with a clean towel.  10. Wear clean pajamas to bed the night before surgery.  12. Place clean sheets on your bed the night of your first shower and do not sleep with pets.  13. Shower again with the CHG soap on the day of surgery prior to arriving at the hospital.  14. Do not apply any deodorants/lotions/powders.  15. Please wear clean clothes to the hospital.

## 2023-01-29 NOTE — Telephone Encounter (Signed)
Patient returning call about her upcoming cardiac imaging study; pt verbalizes understanding of appt date/time, parking situation and where to check in, pre-test NPO status, and verified current allergies; name and call back number provided for further questions should they arise  Larey Brick RN Navigator Cardiac Imaging [facility] [phone] office [phone] cell  Patient aware to avoid caffeine 12 hours prior there cardiac PET scan.

## 2023-01-29 NOTE — Telephone Encounter (Signed)
Consent for Cardiac PET/CT.   Danielle [name], [place], [facility]  Pager:  [phone] Office: [phone]

## 2023-02-01 NOTE — Telephone Encounter (Signed)
Attempted to call patient regarding upcoming cardiac PET appointment. Left message on voicemail with name and callback number Clance Baquero RN Navigator Cardiac Imaging [facility] [phone] Office [phone] Cell  

## 2023-02-02 ENCOUNTER — Inpatient Hospital Stay: Admission: RE | Admit: 2023-02-02 | Payer: 59 | Source: Ambulatory Visit

## 2023-02-02 ENCOUNTER — Telehealth: Payer: Self-pay | Admitting: Urgent Care

## 2023-02-02 ENCOUNTER — Telehealth: Payer: Self-pay

## 2023-02-02 ENCOUNTER — Ambulatory Visit (HOSPITAL_COMMUNITY): Admission: RE | Admit: 2023-02-02 | Payer: 59 | Source: Ambulatory Visit

## 2023-02-02 NOTE — Progress Notes (Signed)
Perioperative Services Pre-Admission/Anesthesia Testing    Date: 02/02/23  Name: Danielle [name] MRN:   [id number]  Re: Clearance for surgery  Clinical Notes:  Patient is scheduled for the above orthopedic hardware removal on 02/08/2023 with Dr. Nicholes Rough, DPM. Given recent complaints of chest pain and dyspnea, patient was referred to her cardiologist for evaluation and clearance. Patient has been seen in consult by cardiology; notes reviewed. MD ordered non-invasive cardiovascular workup prior to surgery.   TTE was performed on 01/13/2023 revealing a normal left ventricular systolic function with an EF of 55-60%/ There were no regional wall motion abnormalities. Mild LVH observed. LV diastolic doppler parameters were normal. Right ventricle was mildly enlarged with normal function noted. RAP = 15 mmHg and RVSP = 32.5 mmHg. Left atrium mildly dilated. Trivial mitral valve regurgitation observed. All transvalvular gradients were noted to be normal providing no evidence suggestive of valvular stenosis.  Additionally, MD ordered cardiac PET scan, which was scheduled and confirmed with the patient for 02/02/2023. In review of the available documentation, the patient did not show up for her appointment. Reached out to cardiac imaging nurse navigator Earlene Plater, RN) to discuss. Confirmed that these PET scans were only performed on Tuesdays and Wednesdays at [facility]. Patient will need to call nuclear medicine department at [facility] to reschedule cardiac PET [phone]).   With all of that being said, patient was scheduled to follow up with cardiology after non-invasive workup for result review and clearance for surgery. In light of the fact that workup remains incomplete, patient is going to need to be rescheduled with Dr. Odis Hollingshead. Will communicate with office to make them aware that PET was not performed. Dr. Odis Hollingshead may still wish to see her since the TTE was performed,  however MD will need to be made aware that PET scan HAS NOT been performed at this point.   Impression and Plan:  Danielle [name] was scheduled for preoperative labs today here at [facility] in preparation for podiatric surgery early next week (02/08/2023). Her appointment was at 1430, however she did not show up for this appointment, nor did she call to reschedule. At this point, there are several unmet aspects of care that are going to necessitate the rescheduling of her procedure. Outstanding items include:  PET CT scan at [facility]. This presents a problem as there is limited scheduling availability due to these particular scans on being performed on certain days of the week. Patient needs to call to reschedule.   Cardiology follow up and clearance. Scheduled for appointment on 02/04/2023, however based on the aforementioned, I anticipate that this will need to be rescheduled so that informed clinical decision can be made based on clinical exam, presence of symptoms, and reviewed of ordered diagnostics.   Preoperative labs. Per anesthesia guidelines, patient will need baseline labs prior to surgery. This is not a hard stop as this testing, while not ideal, can be performed on the day of her procedure. In the event that there are any significant abnormalities, anesthesia may need to postponed her procedure in order to allow for patient to be medically optimized by her various care teams prior to surgery.   Copy of note being sent to Dr. Odis Hollingshead (cardiology) and Dr. Allena Katz (podiatry) to make them aware. As mentioned, I have reached out to the respective offices to make them aware of rescheduling needs, as well as need for surgery to be postponed being appropriate clearance/disposition  from cardiology service.   Quentin Mulling, MSN, APRN, FNP-C, CEN [facility]  Peri-operative Services Nurse Practitioner Phone: [phone] 02/02/23  2:27 PM  NOTE: This note has been prepared using Dragon dictation software. Despite my best ability to proofread, there is always the potential that unintentional transcriptional errors may still occur from this process.

## 2023-02-02 NOTE — Telephone Encounter (Signed)
Received secure chat from Quentin Mulling, NP at [facility].     Per Dr. Allena Katz, cancel surgery and do not reschedule.

## 2023-02-04 ENCOUNTER — Ambulatory Visit: Payer: 59 | Admitting: Cardiology

## 2023-02-04 ENCOUNTER — Encounter (HOSPITAL_COMMUNITY): Payer: Self-pay | Admitting: Cardiology

## 2023-02-08 ENCOUNTER — Ambulatory Visit: Admit: 2023-02-08 | Payer: 59 | Admitting: Podiatry

## 2023-02-08 SURGERY — REMOVAL, HARDWARE
Anesthesia: Choice | Laterality: Right

## 2023-02-12 ENCOUNTER — Encounter: Payer: 59 | Admitting: Podiatry

## 2023-02-12 NOTE — Progress Notes (Deleted)
52 y.o. No obstetric history on file. Married [place] American female here for  NEW GYN/annual exam.    PCP:     No LMP recorded (lmp unknown).           Sexually active: {yes no:314532}  The current method of family planning is {contraception:315051}.    Exercising: {yes no:314532}  {types:19826} Smoker:  no  Health Maintenance: Pap:  *** History of abnormal Pap:  {YES NO:22349} MMG:  09/06/12 BI-RADS CAT 1 neg Colonoscopy:  03/08/20 BMD:   n/a  Result  n/a TDaP:  09/08/18 Gardasil:   no HIV: Hep C: Screening Labs:  Hb today: ***, Urine today: ***   reports that she has never smoked. She has never used smokeless tobacco. She reports that she does not currently use drugs after having used the following drugs: "Crack" cocaine and Cocaine. She reports that she does not drink alcohol.  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    GERD (gastroesophageal reflux disease)    Hypertension    Migraines    Pre-diabetes    Sleep apnea    states had test-no cpap ordered-does snore    Past Surgical History:  Procedure Laterality Date   BIOPSY  03/08/2020   Procedure: BIOPSY;  Surgeon: Kerin Salen, MD;  Location: [facility];  Service: Gastroenterology;;   COLONOSCOPY WITH PROPOFOL N/A 03/08/2020   Procedure: COLONOSCOPY WITH PROPOFOL;  Surgeon: Kerin Salen, MD;  Location: [facility];  Service: Gastroenterology;  Laterality: N/A;   ESOPHAGOGASTRODUODENOSCOPY (EGD) WITH PROPOFOL N/A 03/08/2020   Procedure: ESOPHAGOGASTRODUODENOSCOPY (EGD) WITH PROPOFOL;  Surgeon: Kerin Salen, MD;  Location: [facility];  Service: Gastroenterology;  Laterality: N/A;   FRACTURE SURGERY     rt femer/knee-plate-screws-   ORIF ANKLE FRACTURE  03/16/2012   Procedure: OPEN REDUCTION INTERNAL FIXATION (ORIF) ANKLE FRACTURE;   Surgeon: Sherri Rad, MD;  Location: [facility];  Service: Orthopedics;  Laterality: Right;  right lateral malleolus fracture    Current Outpatient Medications  Medication Sig Dispense Refill   traMADol (ULTRAM) 50 MG tablet Take 1-2 tablets (50-100 mg total) by mouth 3 (three) times daily as needed. (Patient not taking: Reported on 01/29/2023) 60 tablet 0   albuterol (PROVENTIL) (2.5 MG/3ML) 0.083% nebulizer solution Take 2.5 mg by nebulization every 6 (six) hours as needed for wheezing or shortness of breath.     albuterol (VENTOLIN HFA) 108 (90 Base) MCG/ACT inhaler Inhale 2 puffs into the lungs every 6 (six) hours as needed for wheezing or shortness of breath.     amLODipine (NORVASC) 10 MG tablet Take 1 tablet (10 mg total) by mouth every morning. 90 tablet 0   amoxicillin (AMOXIL) 500 MG capsule Take 500 mg by mouth every 8 (eight) hours.     carbamazepine (TEGRETOL XR) 100 MG 12 hr tablet Take 100 mg by mouth 2 (two) times daily.     diclofenac (VOLTAREN) 75 MG EC tablet Take 1 tablet (75 mg total) by mouth 2 (two) times daily. 30 tablet 2   diphenhydramine-acetaminophen (TYLENOL PM) 25-500 MG TABS tablet Take 1 tablet by mouth at bedtime as needed.     fluticasone furoate-vilanterol (BREO ELLIPTA) 100-25 MCG/ACT AEPB Inhale 1 puff into the lungs daily. (Patient taking differently: Inhale  1 puff into the lungs every morning.) 30 each 5   gabapentin (NEURONTIN) 300 MG capsule Take 300 mg by mouth 2 (two) times daily as needed.     hydrOXYzine (ATARAX) 50 MG tablet Take 50-100 mg by mouth at bedtime.     ibuprofen (ADVIL) 800 MG tablet Take 800 mg by mouth every 8 (eight) hours as needed.     Naproxen Sod-diphenhydrAMINE (ALEVE PM) 220-25 MG TABS Take 1 tablet by mouth at bedtime. (Patient not taking: Reported on 01/29/2023)     omeprazole (PRILOSEC) 40 MG capsule Take 40 mg by mouth every morning.     sertraline (ZOLOFT) 100 MG tablet Take 100 mg by mouth daily.      SYMBICORT 160-4.5 MCG/ACT inhaler SMARTSIG:2 Puff(s) By Mouth Twice Daily (Patient not taking: Reported on 01/29/2023)     No current facility-administered medications for this visit.    Family History  Problem Relation Age of Onset   Cancer Father    High blood pressure Father    Asthma Sister    Eczema Sister    SIDS Son    Allergic rhinitis Neg Hx    Urticaria Neg Hx     Review of Systems  Exam:   LMP  (LMP Unknown) Comment: probably last cycle February 2024    General appearance: alert, cooperative and appears stated age Head: normocephalic, without obvious abnormality, atraumatic Neck: no adenopathy, supple, symmetrical, trachea midline and thyroid normal to inspection and palpation Lungs: clear to auscultation bilaterally Breasts: normal appearance, no masses or tenderness, No nipple retraction or dimpling, No nipple discharge or bleeding, No axillary adenopathy Heart: regular rate and rhythm Abdomen: soft, non-tender; no masses, no organomegaly Extremities: extremities normal, atraumatic, no cyanosis or edema Skin: skin color, texture, turgor normal. No rashes or lesions Lymph nodes: cervical, supraclavicular, and axillary nodes normal. Neurologic: grossly normal  Pelvic: External genitalia:  no lesions              No abnormal inguinal nodes palpated.              Urethra:  normal appearing urethra with no masses, tenderness or lesions              Bartholins and Skenes: normal                 Vagina: normal appearing vagina with normal color and discharge, no lesions              Cervix: no lesions              Pap taken: {yes no:314532} Bimanual Exam:  Uterus:  normal size, contour, position, consistency, mobility, non-tender              Adnexa: no mass, fullness, tenderness              Rectal exam: {yes no:314532}.  Confirms.              Anus:  normal sphincter tone, no lesions  Chaperone was present for exam:  ***  Assessment:   Well woman visit with  gynecologic exam.   Plan: Mammogram screening discussed. Self breast awareness reviewed. Pap and HR HPV as above. Guidelines for Calcium, Vitamin D, regular exercise program including cardiovascular and weight bearing exercise.   Follow up annually and prn.   Additional counseling given.  {yes [id number]. _______ minutes face to face time of which over 50% was spent in counseling.    After visit summary provided.

## 2023-02-16 ENCOUNTER — Encounter: Payer: 59 | Admitting: Podiatry

## 2023-02-16 NOTE — Progress Notes (Signed)
LMTCB

## 2023-02-18 NOTE — Progress Notes (Signed)
Called patient no answer, left a vm

## 2023-02-19 NOTE — Progress Notes (Signed)
Called and spoke with patient regarding her echocardiogram results.  Call was transferred to [id number]([id number]) to schedule PET/CT appt.

## 2023-02-24 NOTE — Progress Notes (Signed)
It's rescheduled for 03/03/23 @ 11:20 am.

## 2023-02-25 ENCOUNTER — Ambulatory Visit: Payer: 59 | Admitting: Obstetrics and Gynecology

## 2023-03-01 ENCOUNTER — Telehealth (HOSPITAL_COMMUNITY): Payer: Self-pay | Admitting: *Deleted

## 2023-03-01 NOTE — Telephone Encounter (Signed)
Attempted to call patient regarding upcoming cardiac PET appointment. Left message on voicemail with name and callback number  Larey Brick RN Navigator Cardiac Imaging [facility] [phone] Office [phone] Cell  Reminder to avoid caffeine 12 hours prior to her cardiac PET appointment.

## 2023-03-02 ENCOUNTER — Telehealth (HOSPITAL_COMMUNITY): Payer: Self-pay | Admitting: *Deleted

## 2023-03-02 ENCOUNTER — Encounter: Payer: 59 | Admitting: Podiatry

## 2023-03-02 NOTE — Telephone Encounter (Signed)
Reaching out to patient to offer assistance regarding upcoming cardiac imaging study; pt verbalizes understanding of appt date/time, parking situation and where to check in, pre-test NPO status; name and call back number provided for further questions should they arise  Larey Brick RN Navigator Cardiac Imaging [facility] [phone] office [phone] cell  Patient verbalized understanding to avoid caffeine 12 hours prior to cardiac PET scan.

## 2023-03-03 ENCOUNTER — Encounter (HOSPITAL_COMMUNITY): Admission: RE | Admit: 2023-03-03 | Payer: 59 | Source: Ambulatory Visit

## 2023-03-04 ENCOUNTER — Telehealth: Payer: Self-pay | Admitting: Cardiology

## 2023-03-04 NOTE — Telephone Encounter (Signed)
Patient called this morning about her missed PET SCAN patient was aware that was unable to rescheduled this appointment when it was made.

## 2023-03-21 ENCOUNTER — Other Ambulatory Visit: Payer: Self-pay | Admitting: Cardiology

## 2023-03-21 DIAGNOSIS — I10 Essential (primary) hypertension: Secondary | ICD-10-CM

## 2023-03-25 ENCOUNTER — Other Ambulatory Visit: Payer: Self-pay | Admitting: Infectious Diseases

## 2023-03-25 DIAGNOSIS — Z1231 Encounter for screening mammogram for malignant neoplasm of breast: Secondary | ICD-10-CM

## 2023-03-29 ENCOUNTER — Other Ambulatory Visit: Payer: Self-pay | Admitting: Family Medicine

## 2023-03-29 DIAGNOSIS — Z1231 Encounter for screening mammogram for malignant neoplasm of breast: Secondary | ICD-10-CM

## 2023-03-31 ENCOUNTER — Ambulatory Visit (INDEPENDENT_AMBULATORY_CARE_PROVIDER_SITE_OTHER): Payer: 59 | Admitting: Orthopaedic Surgery

## 2023-03-31 ENCOUNTER — Encounter: Payer: Self-pay | Admitting: Orthopaedic Surgery

## 2023-03-31 VITALS — Ht 64.5 in | Wt 318.0 lb

## 2023-03-31 DIAGNOSIS — M1711 Unilateral primary osteoarthritis, right knee: Secondary | ICD-10-CM | POA: Diagnosis not present

## 2023-03-31 DIAGNOSIS — Z6841 Body Mass Index (BMI) 40.0 and over, adult: Secondary | ICD-10-CM

## 2023-03-31 NOTE — Progress Notes (Signed)
Office Visit Note   [name]           Date of Birth: April 05, 1971           MRN: [id number] Visit Date: 03/31/2023              Requested by: Norm Salt, PA [address] [place],  [place] [address] PCP: Norm Salt, PA   Assessment & Plan: Visit Diagnoses:  1. Primary osteoarthritis of right knee   2. Body mass index 50.0-59.9, adult [facility])     Plan: Impression is 52 year old female with end-stage right knee posttraumatic arthritis with BMI of 53.  Disease process explained and ultimately she will need hardware removal and conversion to a total knee replacement however she will need to lose a significant amount of weight before we can safely do the surgery.  Her goal weight is 240 pounds.  I have sent in referral for aquatic therapy.  Follow-up as needed.  The patient meets the AMA guidelines for Morbid (severe) obesity with a BMI > 40.0 and I have recommended weight loss.  Follow-Up Instructions: No follow-ups on file.   Orders:  Orders Placed This Encounter  Procedures   Ambulatory referral to Physical Therapy   No orders of the defined types were placed in this encounter.     Procedures: No procedures performed   Clinical Data: No additional findings.   Subjective: Chief Complaint  Patient presents with   Right Knee - Pain    HPI Danielle [name] returns today for follow-up on chronic right knee pain.  She reports severe achiness.  She is wondering if the plates and the screws can be taken out.  She reports constant grinding sensation. Review of Systems  Constitutional: Negative.   HENT: Negative.    Eyes: Negative.   Respiratory: Negative.    Cardiovascular: Negative.   Endocrine: Negative.   Musculoskeletal: Negative.   Neurological: Negative.   Hematological: Negative.   Psychiatric/Behavioral: Negative.    All other systems reviewed and are negative.    Objective: Vital Signs: Ht 5' 4.5" (1.638 m)   Wt (!)  318 lb (144.2 kg)   BMI 53.74 kg/m   Physical Exam Vitals and nursing note reviewed.  Constitutional:      Appearance: She is well-developed.  HENT:     Head: Atraumatic.     Nose: Nose normal.  Eyes:     Extraocular Movements: Extraocular movements intact.  Cardiovascular:     Pulses: Normal pulses.  Pulmonary:     Effort: Pulmonary effort is normal.  Abdominal:     Palpations: Abdomen is soft.  Musculoskeletal:     Cervical back: Neck supple.  Skin:    General: Skin is warm.     Capillary Refill: Capillary refill takes less than 2 seconds.  Neurological:     Mental Status: She is alert. Mental status is at baseline.  Psychiatric:        Behavior: Behavior normal.        Thought Content: Thought content normal.        Judgment: Judgment normal.     Ortho Exam Examination of the right knee shows fully healed surgical scar.  She has pain and crepitus throughout range of motion.  Collaterals and cruciates are grossly stable.  Imaging: No results found.   PMFS History: Patient Active Problem List   Diagnosis Date Noted   Precordial pain 01/20/2023   Snoring 03/20/2022   Hx of sleep  apnea 03/02/2022   Iron deficiency anemia due to chronic blood loss 01/19/2022   Pruritus 08/15/2020   Asthma 08/15/2020   Heartburn 08/15/2020   Closed nondisplaced fracture of neck of fifth metacarpal bone of right hand with routine healing 12/13/2015   HYPERTENSION 11/12/2010   OBESITY 11/12/2010   COCAINE DEPENDENCE, IN REMISSION 11/12/2010   DEPRESSION 11/12/2010   GERD 11/12/2010   BACK PAIN 11/12/2010   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    GERD (gastroesophageal reflux disease)    Hypertension    Migraines    Pre-diabetes    Sleep apnea    states had test-no cpap ordered-does  snore    Family History  Problem Relation Age of Onset   Cancer Father    High blood pressure Father    Asthma Sister    Eczema Sister    SIDS Son    Allergic rhinitis Neg Hx    Urticaria Neg Hx     Past Surgical History:  Procedure Laterality Date   BIOPSY  03/08/2020   Procedure: BIOPSY;  Surgeon: Kerin Salen, MD;  Location: [facility];  Service: Gastroenterology;;   COLONOSCOPY WITH PROPOFOL N/A 03/08/2020   Procedure: COLONOSCOPY WITH PROPOFOL;  Surgeon: Kerin Salen, MD;  Location: [facility];  Service: Gastroenterology;  Laterality: N/A;   ESOPHAGOGASTRODUODENOSCOPY (EGD) WITH PROPOFOL N/A 03/08/2020   Procedure: ESOPHAGOGASTRODUODENOSCOPY (EGD) WITH PROPOFOL;  Surgeon: Kerin Salen, MD;  Location: [facility];  Service: Gastroenterology;  Laterality: N/A;   FRACTURE SURGERY     rt femer/knee-plate-screws-   ORIF ANKLE FRACTURE  03/16/2012   Procedure: OPEN REDUCTION INTERNAL FIXATION (ORIF) ANKLE FRACTURE;  Surgeon: Sherri Rad, MD;  Location: [facility];  Service: Orthopedics;  Laterality: Right;  right lateral malleolus fracture   Social History   Occupational History   Not on file  Tobacco Use   Smoking status: Never   Smokeless tobacco: Never  Vaping Use   Vaping Use: Never used  Substance and Sexual Activity   Alcohol use: No    Comment: occasionally   Drug use: Not Currently    Types: "Crack" cocaine, Cocaine    Comment: remission 11/12   Sexual activity: Yes    Birth control/protection: None

## 2023-04-14 NOTE — Therapy (Deleted)
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    GERD (gastroesophageal reflux disease)    Hypertension    Migraines    Pre-diabetes    Sleep apnea    states had test-no cpap ordered-does snore   Past Surgical History:  Procedure Laterality Date   BIOPSY  03/08/2020   Procedure: BIOPSY;  Surgeon: Kerin Salen, MD;  Location: [facility];  Service: Gastroenterology;;   COLONOSCOPY WITH PROPOFOL N/A 03/08/2020   Procedure: COLONOSCOPY WITH PROPOFOL;  Surgeon: Kerin Salen, MD;  Location: [facility];  Service: Gastroenterology;  Laterality: N/A;   ESOPHAGOGASTRODUODENOSCOPY (EGD) WITH PROPOFOL N/A 03/08/2020   Procedure: ESOPHAGOGASTRODUODENOSCOPY (EGD) WITH PROPOFOL;  Surgeon: Kerin Salen, MD;  Location: [facility];  Service: Gastroenterology;  Laterality: N/A;   FRACTURE SURGERY     rt femer/knee-plate-screws-   ORIF ANKLE FRACTURE  03/16/2012   Procedure: OPEN REDUCTION INTERNAL FIXATION (ORIF) ANKLE FRACTURE;  Surgeon: Sherri Rad, MD;  Location:  [facility];  Service: Orthopedics;  Laterality: Right;  right lateral malleolus fracture   Patient Active Problem List   Diagnosis Date Noted   Precordial pain 01/20/2023   Snoring 03/20/2022   Hx of sleep apnea 03/02/2022   Iron deficiency anemia due to chronic blood loss 01/19/2022   Pruritus 08/15/2020   Asthma 08/15/2020   Heartburn 08/15/2020   Closed nondisplaced fracture of neck of fifth metacarpal bone of right hand with routine healing 12/13/2015   HYPERTENSION 11/12/2010   OBESITY 11/12/2010   COCAINE DEPENDENCE, IN REMISSION 11/12/2010    DEPRESSION 11/12/2010   GERD 11/12/2010   BACK PAIN 11/12/2010    PCP: ***  REFERRING PROVIDER: ***  REFERRING DIAG: ***  THERAPY DIAG:  No diagnosis found.  Rationale for Evaluation and Treatment: {HABREHAB:27488}  ONSET DATE: ***  SUBJECTIVE:   SUBJECTIVE STATEMENT: ***  PERTINENT HISTORY: *** PAIN:  Are you having pain? {OPRCPAIN:27236}  PRECAUTIONS: {Therapy precautions:24002}  WEIGHT BEARING RESTRICTIONS: {Yes ***/No:24003}  FALLS:  Has patient fallen in last 6 months? {fallsyesno:27318}  LIVING ENVIRONMENT: Lives with: {OPRC lives with:25569::"lives with their family"} Lives in: {Lives in:25570} Stairs: {opstairs:27293} Has following equipment at home: {Assistive devices:23999}  OCCUPATION: ***  PLOF: {PLOF:24004}  PATIENT GOALS: ***  NEXT MD VISIT: ***  OBJECTIVE:   DIAGNOSTIC FINDINGS: ***  PATIENT SURVEYS:  {rehab surveys:24030}  COGNITION: Overall cognitive status: {cognition:24006}     SENSATION: {sensation:27233}  EDEMA:  {edema:24020}  MUSCLE LENGTH: Hamstrings: Right *** deg; Left *** deg Thomas test: Right *** deg; Left *** deg  POSTURE: {posture:25561}  PALPATION: ***  LOWER EXTREMITY ROM:  {AROM/PROM:27142} ROM Right eval Left eval  Hip flexion    Hip extension    Hip abduction    Hip adduction    Hip internal rotation    Hip external rotation    Knee flexion    Knee extension    Ankle dorsiflexion    Ankle plantarflexion    Ankle inversion    Ankle eversion     (Blank rows = not tested)  LOWER EXTREMITY MMT:  MMT Right eval Left eval  Hip  flexion    Hip extension    Hip abduction    Hip adduction    Hip internal rotation    Hip external rotation    Knee flexion    Knee extension    Ankle dorsiflexion    Ankle plantarflexion    Ankle inversion    Ankle eversion     (Blank rows = not tested)  LOWER EXTREMITY SPECIAL TESTS:  {LEspecialtests:26242}  FUNCTIONAL TESTS:  {Functional  tests:24029}  GAIT: Distance walked: *** Assistive device utilized: {Assistive devices:23999} Level of assistance: {Levels of assistance:24026} Comments: ***   TODAY'S TREATMENT:                                                                                                                              DATE: ***    PATIENT EDUCATION:  Education details: *** Person educated: {Person educated:25204} Education method: {Education Method:25205} Education comprehension: {Education Comprehension:25206}  HOME EXERCISE PROGRAM: ***  ASSESSMENT:  CLINICAL IMPRESSION: Patient is a *** y.o. *** who was seen today for physical therapy evaluation and treatment for ***.   OBJECTIVE IMPAIRMENTS: {opptimpairments:25111}.   ACTIVITY LIMITATIONS: {activitylimitations:27494}  PARTICIPATION LIMITATIONS: {participationrestrictions:25113}  PERSONAL FACTORS: {Personal factors:25162} are also affecting patient's functional outcome.   REHAB POTENTIAL: {rehabpotential:25112}  CLINICAL DECISION MAKING: {clinical decision making:25114}  EVALUATION COMPLEXITY: {Evaluation complexity:25115}   GOALS: Goals reviewed with patient? {yes/no:20286}  SHORT TERM GOALS: Target date: *** *** Baseline: Goal status: INITIAL  2.  *** Baseline:  Goal status: INITIAL  3.  *** Baseline:  Goal status: INITIAL  4.  *** Baseline:  Goal status: INITIAL  5.  *** Baseline:  Goal status: INITIAL  6.  *** Baseline:  Goal status: INITIAL  LONG TERM GOALS: Target date: ***  *** Baseline:  Goal status: INITIAL  2.  *** Baseline:  Goal status: INITIAL  3.  *** Baseline:  Goal status: INITIAL  4.  *** Baseline:  Goal status: INITIAL  5.  *** Baseline:  Goal status: INITIAL  6.  *** Baseline:  Goal status: INITIAL   PLAN:  PT FREQUENCY: {rehab frequency:25116}  PT DURATION: {rehab duration:25117}  PLANNED INTERVENTIONS: {rehab planned interventions:25118::"Therapeutic  exercises","Therapeutic activity","Neuromuscular re-education","Balance training","Gait training","Patient/Family education","Self Care","Joint mobilization"}  PLAN FOR NEXT SESSION: ***   Virgil Lightner, PT 04/14/2023, 11:49 AM

## 2023-04-16 ENCOUNTER — Ambulatory Visit: Payer: 59 | Admitting: Physical Therapy

## 2023-04-22 ENCOUNTER — Encounter: Payer: Self-pay | Admitting: Hematology

## 2023-04-29 ENCOUNTER — Ambulatory Visit: Payer: 59 | Attending: Physical Therapy | Admitting: Physical Therapy

## 2023-08-17 ENCOUNTER — Ambulatory Visit (INDEPENDENT_AMBULATORY_CARE_PROVIDER_SITE_OTHER): Payer: 59 | Admitting: Podiatry

## 2023-08-17 DIAGNOSIS — M7661 Achilles tendinitis, right leg: Secondary | ICD-10-CM

## 2023-08-17 DIAGNOSIS — M9261 Juvenile osteochondrosis of tarsus, right ankle: Secondary | ICD-10-CM

## 2023-08-17 DIAGNOSIS — T8484XA Pain due to internal orthopedic prosthetic devices, implants and grafts, initial encounter: Secondary | ICD-10-CM

## 2023-08-17 NOTE — Progress Notes (Signed)
Subjective:  Patient ID: Danielle [name], female    DOB: 1971-08-13,  MRN: [id number]  Chief Complaint  Patient presents with   Foot Pain    "My right ankle just aches and aches.  I have a new doctor now.  We had talked about get clearance for surgery."   Nail Problem    "I need my toenails done."    52 y.o. female presents with the above complaint.  Patient presents with right ankle hardware pain as well as right posterior heel pain.  She states that she has been going for quite some time is progressive gotten worse.  She has been with this for a year.  She had plan to take the hardware out in the past and she wanted get clearance for surgery.  She would like to have surgical correction of removal of the hardware as well as a posterior heel.  She has tried some cam boot at home which has not helped.   Review of Systems: Negative except as noted in the HPI. Denies N/V/F/Ch.  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    GERD (gastroesophageal reflux disease)    Hypertension    Migraines    Pre-diabetes    Sleep apnea    states had test-no cpap ordered-does snore    Current Outpatient Medications:    albuterol (PROVENTIL) (2.5 MG/3ML) 0.083% nebulizer solution, Take 2.5 mg by nebulization every 6 (six) hours as needed for wheezing or shortness of breath., Disp: , Rfl:    albuterol (VENTOLIN HFA) 108 (90 Base) MCG/ACT inhaler, Inhale 2 puffs into the lungs every 6 (six) hours as needed for wheezing or shortness of breath., Disp: , Rfl:    amLODipine (NORVASC) 10 MG tablet, TAKE 1 TABLET(10 MG) BY MOUTH EVERY MORNING, Disp: 90 tablet, Rfl: 0   amoxicillin (AMOXIL) 500 MG capsule, Take 500 mg by mouth every 8 (eight) hours., Disp: , Rfl:    carbamazepine (TEGRETOL XR) 100 MG 12 hr tablet, Take 100  mg by mouth 2 (two) times daily., Disp: , Rfl:    diclofenac (VOLTAREN) 75 MG EC tablet, Take 1 tablet (75 mg total) by mouth 2 (two) times daily., Disp: 30 tablet, Rfl: 2   diphenhydramine-acetaminophen (TYLENOL PM) 25-500 MG TABS tablet, Take 1 tablet by mouth at bedtime as needed., Disp: , Rfl:    fluticasone furoate-vilanterol (BREO ELLIPTA) 100-25 MCG/ACT AEPB, Inhale 1 puff into the lungs daily. (Patient taking differently: Inhale 1 puff into the lungs every morning.), Disp: 30 each, Rfl: 5   gabapentin (NEURONTIN) 300 MG capsule, Take 300 mg by mouth 2 (two) times daily as needed., Disp: , Rfl:    hydrOXYzine (ATARAX) 50 MG tablet, Take 50-100 mg by mouth at bedtime., Disp: , Rfl:    ibuprofen (ADVIL) 800 MG tablet, Take 800 mg by mouth every 8 (eight) hours as needed., Disp: , Rfl:    Naproxen Sod-diphenhydrAMINE (ALEVE PM) 220-25 MG TABS, Take 1 tablet by mouth at bedtime., Disp: , Rfl:    omeprazole (PRILOSEC) 40 MG capsule, Take 40 mg by mouth every morning., Disp: , Rfl:    sertraline (ZOLOFT) 100 MG tablet, Take 100 mg by mouth daily., Disp: , Rfl:    SYMBICORT 160-4.5 MCG/ACT inhaler, ,  Disp: , Rfl:    traMADol (ULTRAM) 50 MG tablet, Take 1-2 tablets (50-100 mg total) by mouth 3 (three) times daily as needed., Disp: 60 tablet, Rfl: 0  Social History   Tobacco Use  Smoking Status Never  Smokeless Tobacco Never    Allergies  Allergen Reactions   Percocet [Oxycodone-Acetaminophen] Nausea And Vomiting   Objective:  There were no vitals filed for this visit. There is no height or weight on file to calculate BMI. Constitutional Well developed. Well nourished.  Vascular Dorsalis pedis pulses palpable bilaterally. Posterior tibial pulses palpable bilaterally. Capillary refill normal to all digits.  No cyanosis or clubbing noted. Pedal hair growth normal.  Neurologic Normal speech. Oriented to person, place, and time. Epicritic sensation to light touch grossly present  bilaterally.  Dermatologic Nails well groomed and normal in appearance. No open wounds. No skin lesions.  Orthopedic: Pain on palpation to the right ankle pain on the lateral side of the foot.  Pain with ambulation in the ankle.  Pain and palpation to the previous hardware.  No open wounds or lesion noted.  On palpation of right Achilles tendon insertion positive Haglund's deformity noted positive gastrocnemius equinus noted no pain at the posterior tibial tendon ATFL ligament peroneal tendon   Radiographs: 3 views of skeletally mature adult previous x-rays were reviewed.  Fibular hardware noted.  No abnormalities noted slight backing out noted but overall still within normal limits Assessment:   1. Achilles tendinitis, right leg   2. Painful orthopaedic hardware (HCC)   3. Haglund's deformity, right    Plan:  Patient was evaluated and treated and all questions answered.  Right painful orthopedic hardware -All questions and concerns were discussed with the patient in extensive detail.  She has tried conservative care and she was previously scheduled.  Given that she is also having Achilles tendinitis pain patient will benefit from an MRI to look at the Achilles tendon and rule out any tearing.  I discussed with patient she states understanding like to proceed with getting an MRI MRI was ordered -Will plan on discussing surgery during next visit for hardware removal with Achilles tendon repair/Haglund's resection -  No follow-ups on file.

## 2023-08-19 ENCOUNTER — Encounter: Payer: Self-pay | Admitting: Hematology

## 2023-08-26 ENCOUNTER — Ambulatory Visit: Payer: 59

## 2023-09-02 ENCOUNTER — Ambulatory Visit: Payer: 59

## 2023-09-09 ENCOUNTER — Ambulatory Visit: Payer: 59

## 2023-09-10 ENCOUNTER — Other Ambulatory Visit: Payer: 59

## 2023-09-14 ENCOUNTER — Encounter: Payer: Self-pay | Admitting: Hematology

## 2023-09-14 ENCOUNTER — Ambulatory Visit
Admission: RE | Admit: 2023-09-14 | Discharge: 2023-09-14 | Disposition: A | Discharge status: Home / Self Care | Payer: 59 | Source: Ambulatory Visit | Attending: Family Medicine | Admitting: Family Medicine

## 2023-09-14 ENCOUNTER — Ambulatory Visit
Admission: RE | Admit: 2023-09-14 | Discharge: 2023-09-14 | Disposition: A | Discharge status: Home / Self Care | Payer: 59 | Source: Ambulatory Visit | Attending: Podiatry | Admitting: Podiatry

## 2023-09-14 ENCOUNTER — Ambulatory Visit: Payer: 59 | Admitting: Podiatry

## 2023-09-14 DIAGNOSIS — Z1231 Encounter for screening mammogram for malignant neoplasm of breast: Secondary | ICD-10-CM | POA: Diagnosis present

## 2023-09-14 DIAGNOSIS — M7661 Achilles tendinitis, right leg: Secondary | ICD-10-CM

## 2023-09-23 ENCOUNTER — Encounter (HOSPITAL_BASED_OUTPATIENT_CLINIC_OR_DEPARTMENT_OTHER): Payer: Self-pay | Admitting: Student

## 2023-09-23 ENCOUNTER — Encounter: Payer: Self-pay | Admitting: Hematology

## 2023-09-23 ENCOUNTER — Other Ambulatory Visit (HOSPITAL_BASED_OUTPATIENT_CLINIC_OR_DEPARTMENT_OTHER): Payer: Self-pay

## 2023-09-23 ENCOUNTER — Ambulatory Visit (HOSPITAL_BASED_OUTPATIENT_CLINIC_OR_DEPARTMENT_OTHER): Payer: 59

## 2023-09-23 ENCOUNTER — Ambulatory Visit (HOSPITAL_BASED_OUTPATIENT_CLINIC_OR_DEPARTMENT_OTHER): Payer: 59 | Admitting: Student

## 2023-09-23 DIAGNOSIS — M25552 Pain in left hip: Secondary | ICD-10-CM

## 2023-09-23 DIAGNOSIS — M5442 Lumbago with sciatica, left side: Secondary | ICD-10-CM | POA: Diagnosis not present

## 2023-09-23 MED ORDER — METHYLPREDNISOLONE 4 MG PO TBPK
ORAL_TABLET | ORAL | 0 refills | Status: DC
  Filled 2023-09-23: qty 21, 6d supply, fill #0

## 2023-09-23 MED ORDER — METHOCARBAMOL 500 MG PO TABS
500.0000 mg | ORAL_TABLET | Freq: Four times a day (QID) | ORAL | 0 refills | Status: AC
  Filled 2023-09-23: qty 40, 10d supply, fill #0

## 2023-09-23 NOTE — Progress Notes (Signed)
Chief Complaint: Right knee pain     History of Present Illness:    Danielle [name] is a 52 y.o. female here today for evaluation of pain around her left hip.  This began over a week ago without any known injury or cause.  Did have a few mechanical falls a few weeks ago but is unsure if this is related.  States that she has pain in the left side of her low back as well as the posterior and lateral hip and groin area.  Rates this as severe and that the pain feels sharp.  Does have some tingling in the posterior hip but nothing travels down the leg.  Has tried Tylenol, Advil, naproxen, and received what sounds like a Toradol injection without any significant relief.  No saddle anesthesia or bowel/bladder dysfunction.  She has seen Dr. Roda Shutters previously for osteoarthritis in her right knee.  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    GERD (gastroesophageal reflux disease)    Hypertension    Migraines    Pre-diabetes    Sleep apnea    states had test-no cpap ordered-does snore   Past Surgical History:  Procedure Laterality Date   BIOPSY  03/08/2020   Procedure: BIOPSY;  Surgeon: Kerin Salen, MD;  Location: [facility];  Service: Gastroenterology;;   COLONOSCOPY WITH PROPOFOL N/A 03/08/2020   Procedure: COLONOSCOPY WITH PROPOFOL;  Surgeon: Kerin Salen, MD;  Location: [facility];  Service: Gastroenterology;  Laterality: N/A;   ESOPHAGOGASTRODUODENOSCOPY (EGD) WITH PROPOFOL N/A 03/08/2020   Procedure: ESOPHAGOGASTRODUODENOSCOPY (EGD) WITH PROPOFOL;  Surgeon: Kerin Salen, MD;  Location: [facility];  Service: Gastroenterology;  Laterality: N/A;   FRACTURE SURGERY     rt  femer/knee-plate-screws-   ORIF ANKLE FRACTURE  03/16/2012   Procedure: OPEN REDUCTION INTERNAL FIXATION (ORIF) ANKLE FRACTURE;  Surgeon: Sherri Rad, MD;  Location: [facility];  Service: Orthopedics;  Laterality: Right;  right lateral malleolus fracture   Social History   Socioeconomic History   Marital status: Married    Spouse name: Not on file   Number of children: 4   Years of education: Not on file   Highest education level: Not on file  Occupational History   Not on file  Tobacco Use   Smoking status: Never   Smokeless tobacco: Never  Vaping Use   Vaping status: Never Used  Substance and Sexual Activity   Alcohol use: No    Comment: occasionally   Drug use: Not Currently    Types: "Crack" cocaine, Cocaine    Comment: remission 11/12   Sexual activity: Yes    Birth control/protection: None  Other Topics Concern   Not on file  Social History Narrative   Not on file   Social Determinants of Health   Financial Resource Strain: Not on file  Food Insecurity: Not on file  Transportation Needs: Not on file  Physical Activity: Not on file  Stress: Not on file  Social Connections: Not on file  Family History  Problem Relation Age of Onset   Cancer Father    High blood pressure Father    Asthma Sister    Eczema Sister    SIDS Son    Allergic rhinitis Neg Hx    Urticaria Neg Hx    Allergies  Allergen Reactions   Percocet [Oxycodone-Acetaminophen] Nausea And Vomiting   Current Outpatient Medications  Medication Sig Dispense Refill   methocarbamol (ROBAXIN) 500 MG tablet Take 1 tablet (500 mg total) by mouth 4 (four) times daily for 10 days. 40 tablet 0   methylPREDNISolone (MEDROL DOSEPAK) 4 MG TBPK tablet Take per packet instructions 21 each 0   albuterol (PROVENTIL) (2.5 MG/3ML) 0.083% nebulizer solution Take 2.5 mg by nebulization every 6 (six) hours as needed for wheezing or shortness of breath.     albuterol (VENTOLIN HFA) 108 (90 Base)  MCG/ACT inhaler Inhale 2 puffs into the lungs every 6 (six) hours as needed for wheezing or shortness of breath.     amLODipine (NORVASC) 10 MG tablet TAKE 1 TABLET(10 MG) BY MOUTH EVERY MORNING 90 tablet 0   amoxicillin (AMOXIL) 500 MG capsule Take 500 mg by mouth every 8 (eight) hours.     carbamazepine (TEGRETOL XR) 100 MG 12 hr tablet Take 100 mg by mouth 2 (two) times daily.     diclofenac (VOLTAREN) 75 MG EC tablet Take 1 tablet (75 mg total) by mouth 2 (two) times daily. 30 tablet 2   diphenhydramine-acetaminophen (TYLENOL PM) 25-500 MG TABS tablet Take 1 tablet by mouth at bedtime as needed.     fluticasone furoate-vilanterol (BREO ELLIPTA) 100-25 MCG/ACT AEPB Inhale 1 puff into the lungs daily. (Patient taking differently: Inhale 1 puff into the lungs every morning.) 30 each 5   gabapentin (NEURONTIN) 300 MG capsule Take 300 mg by mouth 2 (two) times daily as needed.     hydrOXYzine (ATARAX) 50 MG tablet Take 50-100 mg by mouth at bedtime.     ibuprofen (ADVIL) 800 MG tablet Take 800 mg by mouth every 8 (eight) hours as needed.     Naproxen Sod-diphenhydrAMINE (ALEVE PM) 220-25 MG TABS Take 1 tablet by mouth at bedtime.     omeprazole (PRILOSEC) 40 MG capsule Take 40 mg by mouth every morning.     sertraline (ZOLOFT) 100 MG tablet Take 100 mg by mouth daily.     SYMBICORT 160-4.5 MCG/ACT inhaler      traMADol (ULTRAM) 50 MG tablet Take 1-2 tablets (50-100 mg total) by mouth 3 (three) times daily as needed. 60 tablet 0   No current facility-administered medications for this visit.   No results found.  Review of Systems:   A ROS was performed including pertinent positives and negatives as documented in the HPI.  Physical Exam :   Constitutional: NAD and appears stated age Neurological: Alert and oriented Psych: Appropriate affect and cooperative There were no vitals taken for this visit.   Comprehensive Musculoskeletal Exam:    Tenderness to palpation throughout the left lumbar  region extending into the left gluteal muscles.  Pain with lumbar flexion and rotation.  Able to passively move the hip to 110 degrees flexion, 20 external rotation, and 20 internal rotation.  Knee flexion and extension strength is 5/5.  Distal neurosensory exam is intact.  Imaging:   Xray (lumbar spine 4 views): Normal alignment of the lumbar spine.  Some loss of disc height at L1-L2.  Anterior osteophytes noted throughout.  No evidence of acute bony abnormality.  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  Negative for fracture or dislocation.   I personally reviewed and interpreted the radiographs.   Assessment:   52 y.o. female with acute, severe left-sided low back and left hip pain.  No red flag symptoms present today.  There is some evidence of arthritis in the left hip however most of her pain does appear in the low back and radiating down.  I think she would benefit from physical therapy before proceeding further with any imaging.  Will plan to refer her to [facility] as this is closer to where she lives.  Will also send her in a Medrol Dosepak and Robaxin to help with symptom relief.  Plan to see her back in about 5 weeks to assess progress with PT unless needed sooner.  Patient understanding and in agreement of plan.  Plan :    -Referral to physical therapy for evaluation and treatment of left-sided low back pain -Return to clinic in 5 weeks for reevaluation     I personally saw and evaluated the patient, and participated in the management and treatment plan.  Hazle Nordmann, PA-C Orthopedics

## 2023-10-04 ENCOUNTER — Other Ambulatory Visit (HOSPITAL_BASED_OUTPATIENT_CLINIC_OR_DEPARTMENT_OTHER): Payer: Self-pay | Admitting: Student

## 2023-10-04 ENCOUNTER — Telehealth: Payer: Self-pay

## 2023-10-04 MED ORDER — TRAMADOL HCL 50 MG PO TABS: 50.0000 mg | ORAL_TABLET | Freq: Four times a day (QID) | ORAL | 0 refills | Status: AC | PRN

## 2023-10-04 NOTE — Telephone Encounter (Signed)
Patient called triage line stating the medication she was given has not helped her back pain. She wanted to know if something else could be sent to her pharmacy? She states she cannot even move. Best contact number is her nephews phone [phone]

## 2023-10-04 NOTE — Telephone Encounter (Signed)
Spoke with patient and sent prescription for Tramadol.  She also has not been contacted from PT, can we check on this?

## 2023-10-05 ENCOUNTER — Ambulatory Visit: Payer: 59 | Admitting: Podiatry

## 2023-10-05 NOTE — Addendum Note (Signed)
Addended by: Barbette Or on: 10/05/2023 11:51 AM   Modules accepted: Orders

## 2023-10-11 ENCOUNTER — Telehealth: Payer: Self-pay | Admitting: Student

## 2023-10-11 NOTE — Telephone Encounter (Signed)
Pt called in stating she is in tremendous pain and would like to speak with someone about medication please call her at [phone]

## 2023-10-11 NOTE — Telephone Encounter (Signed)
Attempted to call patient.  Number provided was incorrect and number on file was unavailable and unable to leave message.

## 2023-10-19 ENCOUNTER — Ambulatory Visit: Payer: 59

## 2023-10-26 ENCOUNTER — Ambulatory Visit: Payer: 59 | Attending: Student

## 2023-10-26 ENCOUNTER — Ambulatory Visit: Payer: 59

## 2023-10-28 ENCOUNTER — Ambulatory Visit (HOSPITAL_BASED_OUTPATIENT_CLINIC_OR_DEPARTMENT_OTHER): Payer: 59 | Admitting: Student

## 2023-10-29 ENCOUNTER — Ambulatory Visit: Payer: 59

## 2023-11-01 ENCOUNTER — Encounter: Payer: Self-pay | Admitting: Hematology

## 2023-11-02 ENCOUNTER — Ambulatory Visit: Payer: 59

## 2023-11-02 ENCOUNTER — Encounter: Payer: Self-pay | Admitting: Podiatry

## 2023-11-02 ENCOUNTER — Ambulatory Visit (INDEPENDENT_AMBULATORY_CARE_PROVIDER_SITE_OTHER): Payer: 59 | Admitting: Podiatry

## 2023-11-02 VITALS — Ht 64.5 in | Wt 318.0 lb

## 2023-11-02 DIAGNOSIS — M216X1 Other acquired deformities of right foot: Secondary | ICD-10-CM | POA: Diagnosis not present

## 2023-11-02 DIAGNOSIS — T8484XA Pain due to internal orthopedic prosthetic devices, implants and grafts, initial encounter: Secondary | ICD-10-CM | POA: Diagnosis not present

## 2023-11-02 NOTE — Progress Notes (Signed)
 Subjective:  Patient ID: Danielle [name], female    DOB: 02-01-1971,  MRN: [id number]  No chief complaint on file.   53 y.o. female presents with the above complaint.  Patient presents with right ankle pain.  She just wants to take care of the hardware removal.  The Achilles no longer bothering her.  MRI was reviewed with the patient as well today.   Review of Systems: Negative except as noted in the HPI. Denies N/V/F/Ch.  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    GERD (gastroesophageal reflux disease)    Hypertension    Migraines    Pre-diabetes    Sleep apnea    states had test-no cpap ordered-does snore    Current Outpatient Medications:    albuterol  (PROVENTIL ) (2.5 MG/3ML) 0.083% nebulizer solution, Take 2.5 mg by nebulization every 6 (six) hours as needed for wheezing or shortness of breath., Disp: , Rfl:    albuterol  (VENTOLIN  HFA) 108 (90 Base) MCG/ACT inhaler, Inhale 2 puffs into the lungs every 6 (six) hours as needed for wheezing or shortness of breath., Disp: , Rfl:    amLODipine  (NORVASC ) 10 MG tablet, TAKE 1 TABLET(10 MG) BY MOUTH EVERY MORNING, Disp: 90 tablet, Rfl: 0   amoxicillin  (AMOXIL ) 500 MG capsule, Take 500 mg by mouth every 8 (eight) hours., Disp: , Rfl:    carbamazepine (TEGRETOL XR) 100 MG 12 hr tablet, Take 100 mg by mouth 2 (two) times daily., Disp: , Rfl:    diclofenac  (VOLTAREN ) 75 MG EC tablet, Take 1 tablet (75 mg total) by mouth 2 (two) times daily., Disp: 30 tablet, Rfl: 2   diphenhydramine-acetaminophen  (TYLENOL  PM) 25-500 MG TABS tablet, Take 1 tablet by mouth at bedtime as needed., Disp: , Rfl:    fluticasone  furoate-vilanterol (BREO ELLIPTA ) 100-25 MCG/ACT AEPB, Inhale 1 puff into the lungs daily. (Patient taking differently: Inhale 1 puff into the lungs  every morning.), Disp: 30 each, Rfl: 5   gabapentin (NEURONTIN) 300 MG capsule, Take 300 mg by mouth 2 (two) times daily as needed., Disp: , Rfl:    hydrOXYzine (ATARAX) 50 MG tablet, Take 50-100 mg by mouth at bedtime., Disp: , Rfl:    ibuprofen  (ADVIL ) 800 MG tablet, Take 800 mg by mouth every 8 (eight) hours as needed., Disp: , Rfl:    methylPREDNISolone  (MEDROL  DOSEPAK) 4 MG TBPK tablet, Take per packet instructions, Disp: 21 each, Rfl: 0   Naproxen Sod-diphenhydrAMINE (ALEVE PM) 220-25 MG TABS, Take 1 tablet by mouth at bedtime., Disp: , Rfl:    omeprazole  (PRILOSEC) 40 MG capsule, Take 40 mg by mouth every morning., Disp: , Rfl:    sertraline (ZOLOFT) 100 MG tablet, Take 100 mg by mouth daily., Disp: , Rfl:    SYMBICORT 160-4.5 MCG/ACT inhaler, , Disp: , Rfl:   Social History   Tobacco Use  Smoking Status Never  Smokeless Tobacco Never    Allergies  Allergen Reactions   Percocet  [Oxycodone -Acetaminophen ] Nausea And Vomiting   Objective:  There were no vitals filed for this visit. Body mass index is 53.74 kg/m. Constitutional Well developed. Well nourished.  Vascular Dorsalis pedis pulses palpable bilaterally. Posterior tibial pulses palpable bilaterally. Capillary refill normal to all digits.  No cyanosis or clubbing noted. Pedal  hair growth normal.  Neurologic Normal speech. Oriented to person, place, and time. Epicritic sensation to light touch grossly present bilaterally.  Dermatologic Nails well groomed and normal in appearance. No open wounds. No skin lesions.  Orthopedic: Pain on palpation to the right ankle pain on the lateral side of the foot.  Pain with ambulation in the ankle.  Pain and palpation to the previous hardware.  No open wounds or lesion noted.   Radiographs: 3 views of skeletally mature adult previous x-rays were reviewed.  Fibular hardware noted.  No abnormalities noted slight backing out noted but overall still within normal  limits  IMPRESSION: 1. No acute osseous injury of the right ankle. 2. 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 3. Prior injury of the anterior talofibular ligament and calcaneofibular ligament without a complete tear. Assessment:   No diagnosis found.  Plan:  Patient was evaluated and treated and all questions answered.  Right painful orthopedic hardware -All questions and concerns were discussed with the patient in extensive detail.  She has tried conservative care and she was previously scheduled for surgery however given that she was not able to undergo due to cardiac clearance she may likely be need to done at the hospital as opposed to surgery center.  Will reattempt to get clearance and schedule her for surgical removal of hardware at the hospital -She will be weightbearing as tolerated in a surgical shoe -Informed surgical risk consent was reviewed and read aloud to the patient.  I reviewed the films.  I have discussed my findings with the patient in great detail.  I have discussed all risks including but not limited to infection, stiffness, scarring, limp, disability, deformity, damage to blood vessels and nerves, numbness, poor healing, need for braces, arthritis, chronic pain, amputation, death.  All benefits and realistic expectations discussed in great detail.  I have made no promises as to the outcome.  I have provided realistic expectations.  I have offered the patient a 2nd opinion, which they have declined and assured me they preferred to proceed despite the risks   No follow-ups on file.

## 2023-11-03 ENCOUNTER — Telehealth: Payer: Self-pay | Admitting: Urology

## 2023-11-03 NOTE — Telephone Encounter (Signed)
 Received surgery consent from our [place] location and Called pt Unable to LM.

## 2023-11-09 ENCOUNTER — Ambulatory Visit: Payer: 59 | Admitting: Podiatry

## 2023-12-02 ENCOUNTER — Telehealth: Payer: Self-pay | Admitting: Podiatry

## 2023-12-02 NOTE — Telephone Encounter (Signed)
DOS-12/27/23  REMOVAL FIXATION DEEP KWIRE/SCREW - 20680  [employer] EFFECTIVE DATE-10/20/23  PER THE [employer] PORTAL, PRIOR AUTH IS NOT REQUIRED FOR CPT CODE 16109.  AUTH Decision ID #: [id number]

## 2023-12-08 ENCOUNTER — Encounter: Payer: Self-pay | Admitting: Hematology

## 2023-12-13 ENCOUNTER — Encounter: Payer: Self-pay | Admitting: Hematology

## 2023-12-16 ENCOUNTER — Other Ambulatory Visit: Payer: Self-pay | Admitting: Family Medicine

## 2023-12-16 DIAGNOSIS — Z01818 Encounter for other preprocedural examination: Secondary | ICD-10-CM

## 2023-12-19 ENCOUNTER — Encounter: Payer: Self-pay | Admitting: Urgent Care

## 2023-12-20 ENCOUNTER — Encounter
Admission: RE | Admit: 2023-12-20 | Discharge: 2023-12-20 | Disposition: A | Discharge status: Home / Self Care | Payer: 59 | Source: Ambulatory Visit | Attending: Podiatry | Admitting: Podiatry

## 2023-12-20 ENCOUNTER — Other Ambulatory Visit: Payer: Self-pay

## 2023-12-20 ENCOUNTER — Encounter: Payer: Self-pay | Admitting: Podiatry

## 2023-12-20 ENCOUNTER — Telehealth: Payer: Self-pay | Admitting: *Deleted

## 2023-12-20 ENCOUNTER — Ambulatory Visit
Admission: RE | Admit: 2023-12-20 | Discharge: 2023-12-20 | Disposition: A | Discharge status: Home / Self Care | Source: Ambulatory Visit | Attending: Family Medicine | Admitting: Family Medicine

## 2023-12-20 ENCOUNTER — Ambulatory Visit
Admission: RE | Admit: 2023-12-20 | Discharge: 2023-12-20 | Disposition: A | Discharge status: Home / Self Care | Attending: Family Medicine | Admitting: Family Medicine

## 2023-12-20 DIAGNOSIS — Z01818 Encounter for other preprocedural examination: Secondary | ICD-10-CM

## 2023-12-20 HISTORY — DX: Iron deficiency anemia, unspecified: D50.9

## 2023-12-20 HISTORY — DX: Other psychoactive substance abuse, uncomplicated: F19.10

## 2023-12-20 HISTORY — DX: Obesity, unspecified: E66.9

## 2023-12-20 NOTE — Pre-Procedure Instructions (Signed)
 TC to patient for PAT instructions, she stated that had Labs and EKG today at her PCP Corie Chiquito, MD. [phone])  I informed patient to hold and please do not hang up, while I called her PCP to get a copy of her Labs and EKG so we don't have to repeat them. Patient terminated the call. PCP will fax lab results and EKG once they receive them in 3 days.

## 2023-12-20 NOTE — Patient Instructions (Addendum)
 Your procedure is scheduled on: Monday 12/27/23  Report to the Registration Desk on the 1st floor of the [facility]. To find out your arrival time, please call [phone] between 1PM - 3PM on: Friday 12/24/23  If your arrival time is 6:00 am, do not arrive before that time as the [facility] entrance doors do not open until 6:00 am.  REMEMBER: Instructions that are not followed completely may result in serious medical risk, up to and including death; or upon the discretion of your surgeon and anesthesiologist your surgery may need to be rescheduled.  Do not eat food after midnight the night before surgery.  No gum chewing or hard candies.  You may however, drink CLEAR liquids up to 2 hours before you are scheduled to arrive for your surgery. Do not drink anything within 2 hours of your scheduled arrival time.  Clear liquids include: - water  Do NOT drink anything that is not on this list.  One week prior to surgery: Stop Anti-inflammatories (NSAIDS) such as Advil, Aleve, Ibuprofen, Motrin, Naproxen, Naprosyn and Aspirin based products such as Excedrin, Goody's Powder, BC Powder. Stop ANY OVER THE COUNTER supplements until after surgery.  You may however, continue to take Tylenol if needed for pain up until the day of surgery.   Continue taking all of your other prescription medications up until the day of surgery.  ON THE DAY OF SURGERY ONLY TAKE THESE MEDICATIONS WITH SIPS OF WATER:  sertraline (ZOLOFT) 50 MG  pantoprazole (PROTONIX) 40 MG   Use inhalers on the day of surgery and bring to the hospital. albuterol (VENTOLIN HFA) 108 (90 Base) MCG/ACT inhaler   No Alcohol for 24 hours before or after surgery.  No Smoking including e-cigarettes for 24 hours before surgery.  No chewable tobacco products for at least 6 hours before surgery.  No nicotine patches on the day of surgery.  Do not use any "recreational" drugs for at least a week (preferably 2 weeks) before your  surgery.  Please be advised that the combination of cocaine and anesthesia may have negative outcomes, up to and including death. If you test positive for cocaine, your surgery will be cancelled.  On the morning of surgery brush your teeth with toothpaste and water, you may rinse your mouth with mouthwash if you wish. Do not swallow any toothpaste or mouthwash.  Use CHG Soap or wipes as directed on instruction sheet.  Do not wear jewelry, make-up, hairpins, clips or nail polish.  For welded (permanent) jewelry: bracelets, anklets, waist bands, etc.  Please have this removed prior to surgery.  If it is not removed, there is a chance that hospital personnel will need to cut it off on the day of surgery.  Do not wear lotions, powders, or perfumes.   Do not shave body hair from the neck down 48 hours before surgery.  Contact lenses, hearing aids and dentures may not be worn into surgery.  Do not bring valuables to the hospital. [facility] is not responsible for any missing/lost belongings or valuables.   Notify your doctor if there is any change in your medical condition (cold, fever, infection).  Wear comfortable clothing (specific to your surgery type) to the hospital.  After surgery, you can help prevent lung complications by doing breathing exercises.  Take deep breaths and cough every 1-2 hours. Your doctor may order a device called an Incentive Spirometer to help you take deep breaths. When coughing or sneezing, hold a pillow firmly against  your incision with both hands. This is called "splinting." Doing this helps protect your incision. It also decreases belly discomfort.  If you are being admitted to the hospital overnight, leave your suitcase in the car. After surgery it may be brought to your room.  In case of increased patient census, it may be necessary for you, the patient, to continue your postoperative care in the Same Day Surgery department.  If you are being discharged  the day of surgery, you will not be allowed to drive home. You will need a responsible individual to drive you home and stay with you for 24 hours after surgery.   If you are taking public transportation, you will need to have a responsible individual with you.  Please call the Pre-admissions Testing Dept. at [phone] if you have any questions about these instructions.  Surgery Visitation Policy:  Patients having surgery or a procedure may have two visitors.  Children under the age of 43 must have an adult with them who is not the patient.  Temporary Visitor Restrictions Due to increasing cases of flu, RSV and COVID-19: Children ages 70 and under will not be able to visit patients in [facility] hospitals under most circumstances.  Inpatient Visitation:    Visiting hours are 7 a.m. to 8 p.m. Up to four visitors are allowed at one time in a patient room. The visitors may rotate out with other people during the day.  One visitor age 28 or older may stay with the patient overnight and must be in the room by 8 p.m.

## 2023-12-20 NOTE — Telephone Encounter (Signed)
-----   Message from Verlee Monte sent at 12/20/2023  3:57 PM EST ----- Regarding: Request for pre-operative cardiac clearance Request for pre-operative cardiac clearance:  1. What type of surgery is being performed?  HARDWARE REMOVAL RIGHT FOOT  2. When is this surgery scheduled?  12/27/2023  3. Type of clearance being requested (medical, pharmacy, both)? MEDICAL   4. Are there any medications that need to be held prior to surgery? NONE  5. Practice name and name of physician performing surgery?  Performing surgeon: Dr. Nicholes Rough, DPM Requesting clearance: Quentin Mulling, FNP-C    6. Anesthesia type (none, local, MAC, general)? GENERAL  7. What is the office phone and fax number?   Fax: [phone]  ATTENTION: Unable to create telephone message as per your standard workflow. Directed by [facility] providers to send requests for cardiac clearance to this pool for appropriate distribution to provider covering pre-operative clearances.   Quentin Mulling, MSN, APRN, FNP-C, CEN [facility]  Peri-operative Services Nurse Practitioner Phone: [phone] 12/20/23 3:57 PM

## 2023-12-20 NOTE — Telephone Encounter (Signed)
   Pre-operative Risk Assessment    Patient Name: Danielle [name]  DOB: 08-13-1971 MRN: [id number]   Date of last office visit: 12/15/22 DR. TOLIA Date of next office visit: NONE   Request for Surgical Clearance    Procedure:   HARDWARE REMOVAL OF RIGHT FOOT  Date of Surgery:  Clearance 12/27/23                                Surgeon:  DR. Nicholes Rough Surgeon's Group or Practice Name:  [facility]  Phone number:  [phone] Fax number:  [phone] ATTN: Quentin Mulling, FNP   Type of Clearance Requested:   - Medical ; NONE TO BE HELD PER CLEARANCE FORM   Type of Anesthesia:  General    Additional requests/questions:    Elpidio Anis   12/20/2023, 5:16 PM

## 2023-12-21 ENCOUNTER — Ambulatory Visit: Attending: Physician Assistant | Admitting: Physician Assistant

## 2023-12-21 LAB — COMPREHENSIVE METABOLIC PANEL WITH GFR: EGFR (African American): 88

## 2023-12-21 LAB — HEMOGLOBIN A1C: A1c: 5.9

## 2023-12-21 NOTE — Telephone Encounter (Signed)
 Pt agreeable to appt today to see Danielle [name], [facility] 12/21/23 @ 10:30 for preop clearance.   I will update all parties.

## 2023-12-21 NOTE — Telephone Encounter (Signed)
   Name: Danielle [name]  DOB: Apr 20, 1971  MRN: [id number]  Primary Cardiologist: None  Chart reviewed as part of pre-operative protocol coverage. Because of Monti A Haws's past medical history and time since last visit, she will require a follow-up in-office visit in order to better assess preoperative cardiovascular risk. Pt has not been seen since 11/2022.   Pre-op covering staff: - Please schedule appointment and call patient to inform them. If patient already had an upcoming appointment within acceptable timeframe, please add "pre-op clearance" to the appointment notes so provider is aware. - Please contact requesting surgeon's office via preferred method (i.e, phone, fax) to inform them of need for appointment prior to surgery.   Joylene Grapes, NP  12/21/2023, 8:03 AM

## 2023-12-22 ENCOUNTER — Encounter: Payer: Self-pay | Admitting: Hematology

## 2023-12-24 ENCOUNTER — Telehealth: Payer: Self-pay | Admitting: Podiatry

## 2023-12-24 NOTE — Telephone Encounter (Signed)
 Hey Ms. Okey Regal.... do you know what happened to this lady? No showed again? Im so confused last surgeon not, after she no showed with [facility], for her cardiac PET, and for preop labs with [place]... "cancel surgery and do not reschedule". Here we are again. You know any more than I do about her?  k... she is cancelled anyway. I just want to document to show a trend. She is telling the office that she had no idea about an appoint with cardiology. SMH! I put in a big note to show trend... I will forward to [facility] and the surgeon. I just wanted to know if you knew anything else about her other than she didn't show.

## 2023-12-24 NOTE — Telephone Encounter (Signed)
 Per Carlis Stable NP with [facility] pre op stating pts surgery on 12/27/23 with Dr Allena Katz needs to be canceled due to pt did not show for cardiac clearance appt on 12/21/23.She also did not show for her preop labs and EKG at [facility]. Spoke to Dr Allena Katz and informed him of the cancellation due to reasoning above, he states to cancel surgery and do not reschedule pt with him.We will call pt to inform her of the cancellation of surgery due to reasons stated above.

## 2023-12-24 NOTE — Progress Notes (Signed)
 Perioperative Services Pre-Admission/Anesthesia Testing    Date: 12/24/23  Name: ADALINA DOPSON MRN:   [id number]  Re: Clearance for surgery  Planned Surgical Procedure(s):    Case: [id number] Date/Time: 12/27/23 1209   Procedure: REMOVAL, HARDWARE (Right)   Anesthesia type: Choice   Pre-op diagnosis: painful hardware   Location: [facility] OR ROOM 08 / [facility] FOR ANEST[facility]   Surgeons: Candelaria Stagers, DPM      Clinical Notes:  Patient is scheduled for the above procedure on 12/27/2023 with Dr. Nicholes Rough, DPM. Given recent complaints of chest pain and dyspnea, patient was referred to her cardiologist for evaluation and clearance. Patient has been seen in consult by cardiology; notes reviewed. MD ordered non-invasive cardiovascular workup prior to surgery.   TTE was performed on 01/13/2023 revealing a normal left ventricular systolic function with an EF of 55-60%. There were no regional wall motion abnormalities. Mild LVH observed. LV diastolic doppler parameters were normal. Right ventricle was mildly enlarged with normal function noted. RAP = 15 mmHg and RVSP = 32.5 mmHg. Left atrium mildly dilated. Trivial mitral valve regurgitation observed. All transvalvular gradients were noted to be normal providing no evidence suggestive of valvular stenosis. Aorta normal in size with no evidence of ectasia or aneurysmal dilatation.  During her last visit with cardiology, patient with complaints of LEFT sides CP and SOB. There is a (+) history of crack cocaine use. Dr. Odis Hollingshead, MD (cardiology) had  previously ordered a cardiac PET scan, which was scheduled and confirmed with the patient for 02/02/2023. In review of the available documentation, the patient did not show up for her appointment. I have previously communicated with cardiac imaging nurse navigator Earlene Plater, RN) to discuss. Confirmed that these PET scans were only performed on Tuesdays and Wednesdays at [facility]. Patient  will need to call nuclear medicine department at [facility] to reschedule cardiac PET [phone]). Patient was notified of again back in 01/2023. Last communication received from surgeon's office regarding patient was on 02/02/2023, which is noted in [facility], "per Dr. Allena Katz, cancel surgery and do not reschedule".   With all of that being said, patient was scheduled to follow up with cardiology after non-invasive workup for result review and clearance for surgery. In light of the fact that workup remains incomplete, patient needs to be rescheduled with Dr. Odis Hollingshead. Office is aware that cardiac PET scan has not been completed at this point per MD order. Patient was contacted earlier this week and offered an appointment with cardiology provider. She confirmed an appointment to be seen in office on 12/21/2023 at 10:30 AM. In review of her chart, I do not see any documentation that would suggest that patient was seen for this appointment. I reached out to cardiology office to confirm whether or not she was seen, or if her appointment had changed, of if there was any further information available. Per cardiology, patient did not attend appointment, nor did she contact hem again to discuss alternative plans.   Impression and Plan:  Ladonna Snide  is scheduled for upcoming podiatric surgery with Dr. Nicholes Rough, DPM. Patient had preoperative testing (CBC, CMP, PT/INR, aPTT, U-preg, EKG) done with her PCP that all appear to be normal. At this point, there are unmet aspects of care that are going to necessitate the rescheduling of her procedure. Outstanding items include:   PET CT scan at [facility]. This presents a problem as there is limited scheduling availability due to these particular  scans on being performed on certain days of the week. Patient needs to touch base with her cardiology office about having this procedure rescheduled.    Cardiology follow up and clearance. Scheduled for  appointment on 12/21/2022, however it was determined that she did not attend the appointment despite confirming it with the office. Patient will require in office appointment vs phone clearance so that an informed clinical decision can be made based on clinical exam, presence of symptoms, and reviewed of ordered/completed diagnostics.    Copy of note being sent to Dr. Odis Hollingshead (cardiology) and Dr. Allena Katz (podiatry) to make them aware. As mentioned, I have reached out to the respective offices to make them aware of rescheduling needs, as well as need for surgery to be postponed being appropriate clearance/disposition from cardiology service.   Quentin Mulling, MSN, APRN, FNP-C, CEN [facility]  Perioperative Services Nurse Practitioner Phone: [phone] Fax: [phone] 12/24/23 12:27 PM  NOTE: This note has been prepared using Dragon dictation software. Despite my best ability to proofread, there is always the potential that unintentional transcriptional errors may still occur from this process.

## 2023-12-27 ENCOUNTER — Ambulatory Visit: Admission: RE | Admit: 2023-12-27 | Payer: 59 | Source: Ambulatory Visit | Admitting: Podiatry

## 2023-12-27 ENCOUNTER — Encounter: Admission: RE | Payer: Self-pay | Source: Ambulatory Visit

## 2023-12-27 SURGERY — REMOVAL, HARDWARE
Anesthesia: Choice | Laterality: Right

## 2023-12-29 ENCOUNTER — Encounter: Payer: Self-pay | Admitting: Hematology

## 2024-01-04 ENCOUNTER — Encounter: Payer: 59 | Admitting: Podiatry

## 2024-01-11 ENCOUNTER — Telehealth: Payer: Self-pay | Admitting: Podiatry

## 2024-01-11 NOTE — Telephone Encounter (Signed)
 Received call from Danielle [name] at [facility] senior primary care and he was asking if we received  pts surgical consult.  I returned call and left message with office to have him call back to discuss further.  Pt is not to r/s surgery with our office due to multiple no shows for pre surgical procedures and surgeries.

## 2024-01-11 NOTE — Telephone Encounter (Signed)
 Spoke to Drey at pts pcp and explained why pt was no longer able to be scheduled for surgery with Dr Allena Katz as she has had multiple no shows to pre surgical appts and surgeries.  He said that is happening with multiple offices.

## 2024-01-17 ENCOUNTER — Ambulatory Visit (INDEPENDENT_AMBULATORY_CARE_PROVIDER_SITE_OTHER): Admitting: Orthopedic Surgery

## 2024-01-17 ENCOUNTER — Encounter: Payer: Self-pay | Admitting: Orthopedic Surgery

## 2024-01-17 DIAGNOSIS — T85848A Pain due to other internal prosthetic devices, implants and grafts, initial encounter: Secondary | ICD-10-CM | POA: Diagnosis not present

## 2024-01-17 NOTE — Progress Notes (Signed)
 Office Visit Note   [name]           Date of Birth: 1971/07/22           MRN: [id number] Visit Date: 01/17/2024              Requested by: Norm Salt, PA [address] [place],  [place] [address] PCP: Norm Salt, [place]  Chief Complaint  Patient presents with   Right Ankle - Pain      HPI: Patient is a 53 year old woman who is seen for initial evaluation for painful deep retained hardware lateral right ankle.  Patient states she had open reduction internal fixation for an ankle fracture in 2013.  Patient states she has completed her cardiac workup but states that she missed an appointment and was dismissed from her podiatry office.  Patient states she is also having left knee pain.  Assessment & Plan: Visit Diagnoses:  1. Pain from implanted hardware, initial encounter     Plan: Plan for 2 view radiographs of the left knee at follow-up.  Will set her up for outpatient surgery for removal of deep retained hardware.  Risk and benefits of surgery were discussed including infection nonhealing of the wound and need for additional surgery.  Patient states she understands wishes to proceed.  Follow-Up Instructions: No follow-ups on file.   Ortho Exam  Patient is alert, oriented, no adenopathy, well-dressed, normal affect, normal respiratory effort. Examination patient has an antalgic gait with pain in the left knee.  She is primarily tender to palpation of the medial joint line of the left knee collaterals and cruciates are stable.  There is no effusion no cellulitis.    MRI scan of her ankle from November was essentially normal.  Patient has a palpable dorsalis pedis pulse.    Patient has tenderness to palpation of the syndesmosis and tenderness to palpation over the hardware there is no cellulitis.  Review of her radiographs shows screws that may be causing pain across the syndesmosis.  No lytic bony changes.  Imaging: No results found. No  images are attached to the encounter.  Labs: Lab Results  Component Value Date   REPTSTATUS 11/28/2015 FINAL 11/26/2015   CULT  11/26/2015    >=100,000 COLONIES/mL ESCHERICHIA COLI Performed at [facility]    [facility] ESCHERICHIA COLI 11/26/2015     Lab Results  Component Value Date   ALBUMIN 3.7 01/15/2022   ALBUMIN 4.1 01/09/2022   ALBUMIN 3.7 (L) 08/29/2020    Lab Results  Component Value Date   MG 2.2 01/09/2022   No results found for: "VD25OH"  No results found for: "PREALBUMIN"    Latest Ref Rng & Units 04/28/2022    1:08 PM 01/15/2022   12:55 PM 01/15/2022   12:34 PM  CBC EXTENDED  WBC 4.0 - 10.5 K/uL 6.2   5.7   RBC 3.87 - 5.11 MIL/uL 4.24   3.32   Hemoglobin 12.0 - 15.0 g/dL 46.9   7.6   HCT 62.9 - 46.0 % 34.4  26.9  25.4   Platelets 150 - 400 K/uL 426   406   NEUT# 1.7 - 7.7 K/uL   2.2   Lymph# 0.7 - 4.0 K/uL   2.9      There is no height or weight on file to calculate BMI.  Orders:  No orders of the defined types were placed in this encounter.  No orders of the defined types were placed  in this encounter.    Procedures: No procedures performed  Clinical Data: No additional findings.  ROS:  All other systems negative, except as noted in the HPI. Review of Systems  Objective: Vital Signs: LMP  (LMP Unknown) Comment: probably last cycle February 2024  Specialty Comments:  No specialty comments available.  PMFS History: Patient Active Problem List   Diagnosis Date Noted   Precordial pain 01/20/2023   Snoring 03/20/2022   Hx of sleep apnea 03/02/2022   Iron deficiency anemia due to chronic blood loss 01/19/2022   Pruritus 08/15/2020   Asthma 08/15/2020   Heartburn 08/15/2020   Closed nondisplaced fracture of neck of fifth metacarpal bone of right hand with routine healing 12/13/2015   Essential hypertension 11/12/2010   OBESITY 11/12/2010   COCAINE DEPENDENCE, IN REMISSION 11/12/2010   DEPRESSION 11/12/2010   GERD  11/12/2010   Backache 11/12/2010   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    GERD (gastroesophageal reflux disease)    Hypertension    Iron deficiency anemia, unspecified iron deficiency anemia type    Migraines    Obesity    Pre-diabetes    Sleep apnea    states had test-no cpap ordered-does snore   Substance abuse (HCC)     Family History  Problem Relation Age of Onset   Cancer Father    High blood pressure Father    Asthma Sister    Eczema Sister    SIDS Son    Allergic rhinitis Neg Hx    Urticaria Neg Hx     Past Surgical History:  Procedure Laterality Date   BIOPSY  03/08/2020   Procedure: BIOPSY;  Surgeon: Kerin Salen, MD;  Location: [facility];  Service: Gastroenterology;;   COLONOSCOPY WITH PROPOFOL N/A 03/08/2020   Procedure: COLONOSCOPY WITH PROPOFOL;  Surgeon: Kerin Salen, MD;  Location: [facility];  Service: Gastroenterology;  Laterality: N/A;   ESOPHAGOGASTRODUODENOSCOPY (EGD) WITH PROPOFOL N/A 03/08/2020   Procedure: ESOPHAGOGASTRODUODENOSCOPY (EGD) WITH PROPOFOL;  Surgeon: Kerin Salen, MD;  [facility];  Service: Gastroenterology;  Laterality: N/A;   FRACTURE SURGERY     rt femer/knee-plate-screws-   ORIF ANKLE FRACTURE  03/16/2012   Procedure: OPEN REDUCTION INTERNAL FIXATION (ORIF) ANKLE FRACTURE;  Surgeon: Sherri Rad, MD;  Location: [facility];  Service: Orthopedics;  Laterality: Right;  right lateral malleolus fracture   Social History   Occupational History   Not on file  Tobacco Use   Smoking status: Never   Smokeless tobacco: Never  Vaping Use   Vaping status: Never Used  Substance and Sexual Activity   Alcohol use: No    Comment: occasionally   Drug use: Not Currently    Types: "Crack" cocaine, Cocaine    Comment: last time  was January   Sexual activity: Yes    Birth control/protection: None

## 2024-01-18 ENCOUNTER — Telehealth: Payer: Self-pay | Admitting: Orthopedic Surgery

## 2024-01-18 ENCOUNTER — Encounter: Payer: 59 | Admitting: Podiatry

## 2024-01-18 NOTE — Telephone Encounter (Signed)
 01/17/24 ov note faxed [facility] /referring office [phone]

## 2024-01-25 ENCOUNTER — Ambulatory Visit: Admitting: Physician Assistant

## 2024-01-25 ENCOUNTER — Other Ambulatory Visit: Payer: Self-pay

## 2024-01-25 ENCOUNTER — Encounter: Payer: Self-pay | Admitting: Physician Assistant

## 2024-01-25 DIAGNOSIS — G8929 Other chronic pain: Secondary | ICD-10-CM | POA: Diagnosis not present

## 2024-01-25 DIAGNOSIS — M25562 Pain in left knee: Secondary | ICD-10-CM

## 2024-01-25 MED ORDER — MELOXICAM 7.5 MG PO TABS: 7.5000 mg | ORAL_TABLET | Freq: Every day | ORAL | 0 refills | Status: DC

## 2024-01-25 NOTE — Progress Notes (Signed)
 Office Visit Note   [name]           Date of Birth: 1970-11-04           MRN: [id number] Visit Date: 01/25/2024              Requested by: Norm Salt, PA [address] [place],  [place] [address] PCP: Norm Salt, [place]   Assessment & Plan: Visit Diagnoses: Left knee pain  Plan: Patient is a patient of Dr. Audrie Lia comes in with a 4-day history of acute left knee pain.  She said she fell on this but does not remember the mechanism.  I cannot appreciate an effusion and her x-rays do not show any acute fracture she does have quite a bit of arthritis.  She has talked to Dr. Lajoyce Corners in the past about this left knee but is not doing anything about it quite now because she is undergoing removal of hardware on her right ankle on April 24.  She is very difficult to examine she is tender to light touch there appreciate any Crepitus.  She continue to treat this symptomatically with ice May use a walker or crutches if needed should follow-up with Denny Peon or Dr. Lajoyce Corners next week as if she is having significant difficulties may want to delay her surgery on her right ankle  Follow-Up Instructions: Return in about 1 week (around 02/01/2024).   Orders:  Orders Placed This Encounter  Procedures   XR KNEE 3 VIEW LEFT   Meds ordered this encounter  Medications   meloxicam (MOBIC) 7.5 MG tablet    Sig: Take 1 tablet (7.5 mg total) by mouth daily.    Dispense:  30 tablet    Refill:  0      Procedures: No procedures performed   Clinical Data: No additional findings.   Subjective: Chief Complaint  Patient presents with   Left Knee - Pain    Patient fell on Friday and the left knee is swollen and painful all the way around  she is taking Aleve and a topical spray and it is not working      HPI patient is a 53 year old woman who is normally followed by Dr. Lajoyce Corners.  She comes in today complaining of left knee pain after a fall 4 days ago.  On Saturday she had  difficulty ambulating.  States it hurts behind the knee and in front of the knee she says she has difficulty walking she says Aleve does not help she has been using IcyHot spray pain.  Of note on her last visit with Dr. Lajoyce Corners she did mention that she was having some trouble with the left knee and he indicated next time she came in to get left knee x-rays.  Of note when she first did this she stated she could not walk at all on that she is now ambulating today without any assistive device.  I did prescribe her some meloxicam she knows not to take this with other anti-inflammatories  Review of Systems  All other systems reviewed and are negative.    Objective: Vital Signs: LMP  (LMP Unknown) Comment: probably last cycle February 2024  Physical Exam Constitutional:      Appearance: Normal appearance.  Neurological:     Mental Status: She is alert.     Ortho Exam [place] of her left knee cannot appreciate an effusion difficult exam as patient is tender to light touch over the medial lateral posterior aspect  of her knee.  She is able to hold a straight leg raise though she finds it quite quite painful.  She has range of motion.  Compartments are soft and nontender negative Denna Haggard' sign Specialty Comments:  No specialty comments available.  Imaging: No results found.   PMFS History: Patient Active Problem List   Diagnosis Date Noted   Precordial pain 01/20/2023   Snoring 03/20/2022   Hx of sleep apnea 03/02/2022   Iron deficiency anemia due to chronic blood loss 01/19/2022   Pruritus 08/15/2020   Asthma 08/15/2020   Heartburn 08/15/2020   Closed nondisplaced fracture of neck of fifth metacarpal bone of right hand with routine healing 12/13/2015   Essential hypertension 11/12/2010   OBESITY 11/12/2010   COCAINE DEPENDENCE, IN REMISSION 11/12/2010   DEPRESSION 11/12/2010   GERD 11/12/2010   Backache 11/12/2010   Past Medical History:  Diagnosis Date   Anemia    Anxiety     Arthritis    Asthma    Bipolar 1 disorder (HCC)    COPD (chronic obstructive pulmonary disease) (HCC)    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HCC)    crack-cocaine-11/12 last    GERD (gastroesophageal reflux disease)    Hypertension    Iron deficiency anemia, unspecified iron deficiency anemia type    Migraines    Obesity    Pre-diabetes    Sleep apnea    states had test-no cpap ordered-does snore   Substance abuse (HCC)     Family History  Problem Relation Age of Onset   Cancer Father    High blood pressure Father    Asthma Sister    Eczema Sister    SIDS Son    Allergic rhinitis Neg Hx    Urticaria Neg Hx     Past Surgical History:  Procedure Laterality Date   BIOPSY  03/08/2020   Procedure: BIOPSY;  Surgeon: Kerin Salen, MD;  [facility]: [facility];  Service: Gastroenterology;;   COLONOSCOPY WITH PROPOFOL N/A 03/08/2020   Procedure: COLONOSCOPY WITH PROPOFOL;  Surgeon: Kerin Salen, MD;  [facility];  Service: Gastroenterology;  Laterality: N/A;   ESOPHAGOGASTRODUODENOSCOPY (EGD) WITH PROPOFOL N/A 03/08/2020   Procedure: ESOPHAGOGASTRODUODENOSCOPY (EGD) WITH PROPOFOL;  Surgeon: Kerin Salen, MD;  [facility];  Service: Gastroenterology;  Laterality: N/A;   FRACTURE SURGERY     rt femer/knee-plate-screws-   ORIF ANKLE FRACTURE  03/16/2012   Procedure: OPEN REDUCTION INTERNAL FIXATION (ORIF) ANKLE FRACTURE;  Surgeon: Sherri Rad, MD;  Location: [facility];  Service: Orthopedics;  Laterality: Right;  right lateral malleolus fracture   Social History   Occupational History   Not on file  Tobacco Use   Smoking status: Never   Smokeless tobacco: Never  Vaping Use   Vaping status: Never Used  Substance and Sexual Activity   Alcohol use: No    Comment: occasionally   Drug use: Not Currently    Types: "Crack" cocaine, Cocaine    Comment: last time was January   Sexual activity: Yes    Birth control/protection: None

## 2024-02-03 ENCOUNTER — Ambulatory Visit (INDEPENDENT_AMBULATORY_CARE_PROVIDER_SITE_OTHER): Admitting: Orthopedic Surgery

## 2024-02-03 DIAGNOSIS — G8929 Other chronic pain: Secondary | ICD-10-CM | POA: Diagnosis not present

## 2024-02-03 DIAGNOSIS — M25562 Pain in left knee: Secondary | ICD-10-CM | POA: Diagnosis not present

## 2024-02-07 ENCOUNTER — Encounter: Payer: Self-pay | Admitting: Orthopedic Surgery

## 2024-02-07 DIAGNOSIS — G8929 Other chronic pain: Secondary | ICD-10-CM | POA: Diagnosis not present

## 2024-02-07 DIAGNOSIS — M25562 Pain in left knee: Secondary | ICD-10-CM | POA: Diagnosis not present

## 2024-02-07 MED ORDER — LIDOCAINE HCL (PF) 1 % IJ SOLN
5.0000 mL | INTRAMUSCULAR | Status: AC | PRN
  Administered 2024-02-07: 5 mL

## 2024-02-07 MED ORDER — METHYLPREDNISOLONE ACETATE 40 MG/ML IJ SUSP
40.0000 mg | INTRAMUSCULAR | Status: AC | PRN
  Administered 2024-02-07: 40 mg via INTRA_ARTICULAR

## 2024-02-07 NOTE — Progress Notes (Signed)
 Office Visit Note   [name]           Date of Birth: [birth year]           MRN: [id number] Visit Date: 02/03/2024              Requested by: Dianah Fort, PA [address] [place],  [place] [address] PCP: Dianah Fort, [place]  Chief Complaint  Patient presents with   Right Ankle - Pain   Left Knee - Pain      HPI: Patient is a 53 year old woman who presents with osteoarthritis with chronic pain left knee.  Patient is status post internal fixation for right ankle fracture in 2013 with Dr. Ronda Cocks  Assessment & Plan: Visit Diagnoses:  1. Chronic pain of left knee     Plan: Left knee was injected she tolerated this well  Follow-Up Instructions: Return in about 4 weeks (around 03/02/2024).   Ortho Exam  Patient is alert, oriented, no adenopathy, well-dressed, normal affect, normal respiratory effort. Examination patient does have tenderness to palpation of the lateral aspect of the right ankle but has good range of motion of her ankle.  Patient has varus alignment of the left knee.  Clausing cruciates are stable she is tender to palpation of the medial lateral joint line.  There is crepitation with range of motion.  Imaging: No results found. No images are attached to the encounter.  Labs: Lab Results  Component Value Date   REPTSTATUS 11/28/2015 FINAL 11/26/2015   CULT  11/26/2015    >=100,000 COLONIES/mL ESCHERICHIA COLI Performed at [facility]    [facility] ESCHERICHIA COLI 11/26/2015     Lab Results  Component Value Date   ALBUMIN 3.7 01/15/2022   ALBUMIN 4.1 01/09/2022   ALBUMIN 3.7 (L) 08/29/2020    Lab Results  Component Value Date   MG 2.2 01/09/2022   No results found for: "VD25OH"  No results found for: "PREALBUMIN"    Latest Ref Rng & Units 04/28/2022    1:08 PM 01/15/2022   12:55 PM 01/15/2022   12:34 PM  CBC EXTENDED  WBC 4.0 - 10.5 K/uL 6.2   5.7   RBC 3.87 - 5.11 MIL/uL 4.24   3.32   Hemoglobin 12.0  - 15.0 g/dL 82.9   7.6   HCT 56.2 - 46.0 % 34.4  26.9  25.4   Platelets 150 - 400 K/uL 426   406   NEUT# 1.7 - 7.7 K/uL   2.2   Lymph# 0.7 - 4.0 K/uL   2.9      There is no height or weight on file to calculate BMI.  Orders:  No orders of the defined types were placed in this encounter.  No orders of the defined types were placed in this encounter.    Procedures: Large Joint Inj: L knee on 02/07/2024 8:56 AM Indications: pain and diagnostic evaluation Details: 22 G 1.5 in needle, anteromedial approach  Arthrogram: No  Medications: 5 mL lidocaine  (PF) 1 %; 40 mg methylPREDNISolone  acetate 40 MG/ML Outcome: tolerated well, no immediate complications Procedure, treatment alternatives, risks and benefits explained, specific risks discussed. Consent was given by the patient. Immediately prior to procedure a time out was called to verify the correct patient, procedure, equipment, support staff and site/side marked as required. Patient was prepped and draped in the usual sterile fashion.      Clinical Data: No additional findings.  ROS:  All other systems negative, except  as noted in the HPI. Review of Systems  Objective: Vital Signs: LMP  (LMP Unknown) Comment: probably last cycle February 2024  Specialty Comments:  No specialty comments available.  PMFS History: Patient Active Problem List   Diagnosis Date Noted   Precordial pain 01/20/2023   Snoring 03/20/2022   Hx of sleep apnea 03/02/2022   Iron  deficiency anemia due to chronic blood loss 01/19/2022   Pruritus 08/15/2020   Asthma 08/15/2020   Heartburn 08/15/2020   Closed nondisplaced fracture of neck of fifth metacarpal bone of right hand with routine healing 12/13/2015   Essential hypertension 11/12/2010   OBESITY 11/12/2010   COCAINE DEPENDENCE, IN REMISSION 11/12/2010   DEPRESSION 11/12/2010   GERD 11/12/2010   Backache 11/12/2010   Past Medical History:  Diagnosis Date   Anemia    Anxiety     Arthritis    Asthma    Bipolar 1 disorder (HCC)    COPD (chronic obstructive pulmonary disease) (HCC)    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HCC)    crack-cocaine-11/12 last    GERD (gastroesophageal reflux disease)    Hypertension    Iron  deficiency anemia, unspecified iron  deficiency anemia type    Migraines    Obesity    Pre-diabetes    Sleep apnea    states had test-no cpap ordered-does snore   Substance abuse (HCC)     Family History  Problem Relation Age of Onset   Cancer Father    High blood pressure Father    Asthma Sister    Eczema Sister    SIDS Son    Allergic rhinitis Neg Hx    Urticaria Neg Hx     Past Surgical History:  Procedure Laterality Date   BIOPSY  03/08/2020   Procedure: BIOPSY;  Surgeon: Genell Ken, MD;  [facility]: [facility];  Service: Gastroenterology;;   COLONOSCOPY WITH PROPOFOL  N/A 03/08/2020   Procedure: COLONOSCOPY WITH PROPOFOL ;  Surgeon: Genell Ken, MD;  [facility];  Service: Gastroenterology;  Laterality: N/A;   ESOPHAGOGASTRODUODENOSCOPY (EGD) WITH PROPOFOL  N/A 03/08/2020   Procedure: ESOPHAGOGASTRODUODENOSCOPY (EGD) WITH PROPOFOL ;  Surgeon: Genell Ken, MD;  [facility];  Service: Gastroenterology;  Laterality: N/A;   FRACTURE SURGERY     rt femer/knee-plate-screws-   ORIF ANKLE FRACTURE  03/16/2012   Procedure: OPEN REDUCTION INTERNAL FIXATION (ORIF) ANKLE FRACTURE;  Surgeon: Janifer Meigs, MD;  Location: [facility];  Service: Orthopedics;  Laterality: Right;  right lateral malleolus fracture   Social History   Occupational History   Not on file  Tobacco Use   Smoking status: Never   Smokeless tobacco: Never  Vaping Use   Vaping status: Never Used  Substance and Sexual Activity   Alcohol use: No    Comment: occasionally   Drug use: Not Currently    Types: "Crack" cocaine, Cocaine    Comment: last time was January   Sexual activity: Yes    Birth control/protection: None

## 2024-03-02 ENCOUNTER — Ambulatory Visit (INDEPENDENT_AMBULATORY_CARE_PROVIDER_SITE_OTHER): Admitting: Orthopedic Surgery

## 2024-03-02 DIAGNOSIS — M25562 Pain in left knee: Secondary | ICD-10-CM | POA: Diagnosis not present

## 2024-03-02 DIAGNOSIS — G8929 Other chronic pain: Secondary | ICD-10-CM | POA: Diagnosis not present

## 2024-03-03 ENCOUNTER — Encounter: Payer: Self-pay | Admitting: Orthopedic Surgery

## 2024-03-03 DIAGNOSIS — M25562 Pain in left knee: Secondary | ICD-10-CM | POA: Diagnosis not present

## 2024-03-03 DIAGNOSIS — G8929 Other chronic pain: Secondary | ICD-10-CM | POA: Diagnosis not present

## 2024-03-03 MED ORDER — LIDOCAINE HCL (PF) 1 % IJ SOLN
5.0000 mL | INTRAMUSCULAR | Status: AC | PRN
  Administered 2024-03-03: 5 mL

## 2024-03-03 MED ORDER — METHYLPREDNISOLONE ACETATE 40 MG/ML IJ SUSP
40.0000 mg | INTRAMUSCULAR | Status: AC | PRN
Start: 2024-03-03 — End: 2024-03-03
  Administered 2024-03-03: 40 mg via INTRA_ARTICULAR

## 2024-03-03 NOTE — Progress Notes (Signed)
 Office Visit Note   [name]           Date of Birth: 21-Aug-1971           MRN: [id number] Visit Date: 03/02/2024              Requested by: Dianah Fort, PA [address] [place],  [place] [address] PCP: Dianah Fort, [place]  Chief Complaint  Patient presents with   Left Knee - Follow-up    S/p injection 02/03/2024      HPI: Patient is a 53 year old woman who presents with osteoarthritis left knee status post an injection 4 weeks ago.  Patient states the injection worked for about 2 weeks.  Assessment & Plan: Visit Diagnoses:  1. Chronic pain of left knee     Plan: Recommend the patient follow-up with her primary care physician to initiate a GLP-1 medication for weight loss.  Patient will need a total knee arthroplasty and will need to have her BMI below 40.  Possible repeat injection at follow-up.  Follow-Up Instructions: Return in about 4 weeks (around 03/30/2024).   Ortho Exam  Patient is alert, oriented, no adenopathy, well-dressed, normal affect, normal respiratory effort. Examination patient has an antalgic gait.  Collaterals and cruciates are stable she is globally tender to palpation around the knee with crepitation with range of motion.  Imaging: No results found. No images are attached to the encounter.  Labs: Lab Results  Component Value Date   REPTSTATUS 11/28/2015 FINAL 11/26/2015   CULT  11/26/2015    >=100,000 COLONIES/mL ESCHERICHIA COLI Performed at [facility]    [facility] ESCHERICHIA COLI 11/26/2015     Lab Results  Component Value Date   ALBUMIN 3.7 01/15/2022   ALBUMIN 4.1 01/09/2022   ALBUMIN 3.7 (L) 08/29/2020    Lab Results  Component Value Date   MG 2.2 01/09/2022   No results found for: "VD25OH"  No results found for: "PREALBUMIN"    Latest Ref Rng & Units 04/28/2022    1:08 PM 01/15/2022   12:55 PM 01/15/2022   12:34 PM  CBC EXTENDED  WBC 4.0 - 10.5 K/uL 6.2   5.7   RBC 3.87 - 5.11  MIL/uL 4.24   3.32   Hemoglobin 12.0 - 15.0 g/dL 19.1   7.6   HCT 47.8 - 46.0 % 34.4  26.9  25.4   Platelets 150 - 400 K/uL 426   406   NEUT# 1.7 - 7.7 K/uL   2.2   Lymph# 0.7 - 4.0 K/uL   2.9      There is no height or weight on file to calculate BMI.  Orders:  No orders of the defined types were placed in this encounter.  No orders of the defined types were placed in this encounter.    Procedures: Large Joint Inj: L knee on 03/03/2024 4:22 PM Indications: pain and diagnostic evaluation Details: 22 G 1.5 in needle, anteromedial approach  Arthrogram: No  Medications: 5 mL lidocaine  (PF) 1 %; 40 mg methylPREDNISolone  acetate 40 MG/ML Outcome: tolerated well, no immediate complications Procedure, treatment alternatives, risks and benefits explained, specific risks discussed. Consent was given by the patient. Immediately prior to procedure a time out was called to verify the correct patient, procedure, equipment, support staff and site/side marked as required. Patient was prepped and draped in the usual sterile fashion.      Clinical Data: No additional findings.  ROS:  All other systems negative, except  as noted in the HPI. Review of Systems  Objective: Vital Signs: LMP  (LMP Unknown) Comment: probably last cycle February 2024  Specialty Comments:  No specialty comments available.  PMFS History: Patient Active Problem List   Diagnosis Date Noted   Precordial pain 01/20/2023   Snoring 03/20/2022   Hx of sleep apnea 03/02/2022   Iron  deficiency anemia due to chronic blood loss 01/19/2022   Pruritus 08/15/2020   Asthma 08/15/2020   Heartburn 08/15/2020   Closed nondisplaced fracture of neck of fifth metacarpal bone of right hand with routine healing 12/13/2015   Essential hypertension 11/12/2010   OBESITY 11/12/2010   COCAINE DEPENDENCE, IN REMISSION 11/12/2010   DEPRESSION 11/12/2010   GERD 11/12/2010   Backache 11/12/2010   Past Medical History:  Diagnosis  Date   Anemia    Anxiety    Arthritis    Asthma    Bipolar 1 disorder (HCC)    COPD (chronic obstructive pulmonary disease) (HCC)    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HCC)    crack-cocaine-11/12 last    GERD (gastroesophageal reflux disease)    Hypertension    Iron  deficiency anemia, unspecified iron  deficiency anemia type    Migraines    Obesity    Pre-diabetes    Sleep apnea    states had test-no cpap ordered-does snore   Substance abuse (HCC)     Family History  Problem Relation Age of Onset   Cancer Father    High blood pressure Father    Asthma Sister    Eczema Sister    SIDS Son    Allergic rhinitis Neg Hx    Urticaria Neg Hx     Past Surgical History:  Procedure Laterality Date   BIOPSY  03/08/2020   Procedure: BIOPSY;  Surgeon: Genell Ken, MD;  [facility];  Service: Gastroenterology;;   COLONOSCOPY WITH PROPOFOL  N/A 03/08/2020   Procedure: COLONOSCOPY WITH PROPOFOL ;  Surgeon: Genell Ken, MD;  [facility];  Service: Gastroenterology;  Laterality: N/A;   ESOPHAGOGASTRODUODENOSCOPY (EGD) WITH PROPOFOL  N/A 03/08/2020   Procedure: ESOPHAGOGASTRODUODENOSCOPY (EGD) WITH PROPOFOL ;  Surgeon: Genell Ken, MD;  [facility];  Service: Gastroenterology;  Laterality: N/A;   FRACTURE SURGERY     rt femer/knee-plate-screws-   ORIF ANKLE FRACTURE  03/16/2012   Procedure: OPEN REDUCTION INTERNAL FIXATION (ORIF) ANKLE FRACTURE;  Surgeon: Janifer Meigs, MD;  Location: [facility];  Service: Orthopedics;  Laterality: Right;  right lateral malleolus fracture   Social History   Occupational History   Not on file  Tobacco Use   Smoking status: Never   Smokeless tobacco: Never  Vaping Use   Vaping status: Never Used  Substance and Sexual Activity   Alcohol use: No    Comment: occasionally   Drug use: Not Currently    Types: "Crack" cocaine, Cocaine    Comment: last time was January   Sexual activity: Yes    Birth control/protection: None

## 2024-04-06 ENCOUNTER — Ambulatory Visit: Admitting: Orthopedic Surgery

## 2024-04-17 ENCOUNTER — Other Ambulatory Visit: Payer: Self-pay | Admitting: Orthopedic Surgery

## 2024-04-17 ENCOUNTER — Telehealth: Payer: Self-pay | Admitting: Orthopedic Surgery

## 2024-04-17 MED ORDER — MELOXICAM 7.5 MG PO TABS: 7.5000 mg | ORAL_TABLET | Freq: Every day | ORAL | 0 refills | Status: DC

## 2024-04-17 NOTE — Telephone Encounter (Signed)
 Please see below. The pt has been in the office with chronic pain of the knee but must get BMI lower to have total joint surgery. She states that she is in pain and would like something called in for her please advise.

## 2024-04-17 NOTE — Telephone Encounter (Signed)
 Patient called and said that she is in a lot of pain. Can you call something in for her pain. CB#[phone]

## 2024-04-18 NOTE — Telephone Encounter (Signed)
 I called pt and advised of below.

## 2024-05-04 ENCOUNTER — Telehealth: Payer: Self-pay

## 2024-05-04 NOTE — Telephone Encounter (Signed)
 Pt is scheduled to see Ms Hope, NP virtually tomorrow and stated she never received her HST results. LOV was 04-08-22. I don't see anything for this. PCC's, do you see anything for the HST? The only notes I saw was our office was trying to contact her for it 2 years ago.

## 2024-05-04 NOTE — Telephone Encounter (Signed)
 NFN

## 2024-05-04 NOTE — Telephone Encounter (Signed)
 This was ordered back in 2023 and they were not able to get in touch with the patient see phone note from 2023 they will need a new order since this one has expired at this point

## 2024-05-05 ENCOUNTER — Telehealth: Payer: Self-pay | Admitting: Primary Care

## 2024-05-05 ENCOUNTER — Encounter: Payer: Self-pay | Admitting: Primary Care

## 2024-05-05 ENCOUNTER — Telehealth: Admitting: Primary Care

## 2024-05-05 ENCOUNTER — Telehealth: Payer: Self-pay

## 2024-05-05 VITALS — Ht 64.0 in | Wt 340.0 lb

## 2024-05-05 DIAGNOSIS — R0683 Snoring: Secondary | ICD-10-CM

## 2024-05-05 DIAGNOSIS — J449 Chronic obstructive pulmonary disease, unspecified: Secondary | ICD-10-CM

## 2024-05-05 DIAGNOSIS — Z8669 Personal history of other diseases of the nervous system and sense organs: Secondary | ICD-10-CM

## 2024-05-05 NOTE — Telephone Encounter (Signed)
 Patient needs follow-up OV in 6-8 weeks in person with me to review sleep study results

## 2024-05-05 NOTE — Telephone Encounter (Signed)
 Spoke with pt about moving 4pm appt to 1pm, pt verbalized understanding.

## 2024-05-05 NOTE — Progress Notes (Signed)
 Virtual Visit via Video Note  I connected with Danielle [name] on 05/05/24 at  4:00 PM EDT by a video enabled telemedicine application and verified that [name] using two identifiers.  Location: Patient: Home Provider: Office    I discussed the limitations of evaluation and management by telemedicine and the availability of in person appointments. The patient expressed understanding and agreed to proceed.  History of Present Illness:  53 year old female, never smoked.  Past medical history significant for hypertension, asthma, GERD, cocaine abuse, depression, obesity.  Previous [facility] pulmonary encounter: 01/29/2022 Patient presents today for sleep consult. She had sleep study several years ago, results are not available in chart. Sounds like she had previously been on CPAP. She continues to have symptoms of snoring and daytime sleepiness. She has trouble falling asleep at night. She goes to bed at 4p and falls asleep around 8pm. She wakes up 2-3 times a night. She starts her day at 6am. She takes benadryl and tylenol  pm to sleep.   Additionally she experiences dyspnea with exertion and occasional wheezing. She has used nebulizes in the past. Her weight tends to fluctuate. She had spirometry in October 2021 with asthma and allergy that showed moderate restriction without BD response.   Sleep questionnaire Symptoms- Hx sleep apnea. Daytime sleepiness, snoring Prior sleep study- 15 years ago  Bedtime- 4-8pm  Time to fall asleep- several hours  Nocturnal awakenings- 2-3 times  Out of bed/start of day- 6am Weight changes- fluctuates  Do you operate heavy machinery- No Do you currently wear CPAP- No Do you current wear oxygen- No Epworth- 18   03/20/2022 Patient presents today for a 4 to 6-week follow-up sleep consult and dyspnea symptoms.  She has not had sleep study yet.  Dyspnea symptoms have been more prominent over the last 3 months, she cannot walk more  than 150 to 200 feet without getting winded.  She experiences occasional wheezing.  She has tried [employer] in the past but did not like powdered medication and having to rinse her mouth out after use.  She uses 3-4 pillows to bite her head at night while sleeping.  Pulmonary function testing today showed moderate restriction in lung function along with mild diffusion defect.  She has had no recent chest imaging. She had a chest x-ray in 2021 due to shortness of breath that did show mildly progressive cardiomegaly with mild interstitial pulmonary edema or interstitial pneumonitis..  We will order CT chest to assess for interstitial lung disease.   04/08/2022 Patient presents today for 3 week follow-up. During her last visit she was started on Breo.  Shortness of breath and wheezing are better. HRCT scan is scheduled for June 30th. She reports weight gain, she has not been taking her blood pressure medication or diuretics as she should. She will be seeing her primary care physcian tomorrow and will speak with them about her depression.    05/05/2024- Interim hx  Discussed the use of AI scribe software for clinical note transcription with the patient, who gave verbal consent to proceed.  History of Present Illness   Danielle [name] is a 53 year old female with sleep apnea who presents for a virtual follow-up regarding her sleep study. She is accompanied by her husband.  She has a history of sleep apnea and was previously recommended to schedule a sleep study in 2023, but there were unsuccessful attempts to contact her. Her husband reports that she sometimes stops breathing during  sleep, experiences gagging, choking, and has difficulty catching her breath. He notes that she does not snore often but talks in her sleep. No sleepwalking.  She mentions a history of COPD, which was diagnosed as mild obstructive lung disease based on a breathing test conducted in 2023. She has been exposed to secondhand smoke  and has a past history of smoking 'that stuff' a long time ago.    She had been scheduled for HRCT in June 2023 but does not appear this was completed. CXR in March 2025 without active cardiopulmonary disease.   Pulmonary function testing 03/20/2022 FVC 2.36 (79%), FEV1 1.48 (62%), ratio 63, DLCOUNC 14.9 (69%) Moderate restriction/obstruction without bronchodilator response.  Mild diffusion defect.  03/20/2022 FENO>> 17  Observations/Objective:  -Unable to log into video visit, patient does not have internet- converted to televisit.  Assessment and Plan:  1. Hx of sleep apnea (Primary) - Home sleep test; Future  2. Snoring - Home sleep test; Future  Assessment and Plan    Obstructive Sleep Apnea Obstructive sleep apnea with symptoms of choking, gasping, and interrupted breathing during sleep. Previous attempts to schedule a sleep study were unsuccessful due to contact issues. She is willing to undergo a home sleep study. - Order home sleep study through [facility]. - Schedule follow-up appointment in six weeks to review sleep study results and discuss treatment options, including weight loss, oral appliance, CPAP, and potential surgical options.  Mild COPD Mild COPD diagnosed in 2023 based on spirometry showing mild obstructive lung disease. She has exposure to secondhand smoke and previous smoking history. No acute respiratory complaints. She is not on scheduled BD regimen.   Follow Up Instructions:  6-8 weeks to review sleep study and discuss treatment options   I discussed the assessment and treatment plan with the patient. The patient was provided an opportunity to ask questions and all were answered. The patient agreed with the plan and demonstrated an understanding of the instructions.   The patient was advised to call back or seek an in-person evaluation if the symptoms worsen or if the condition fails to improve as anticipated.  I provided 22 minutes of  non-face-to-face time during this encounter.   Almarie LELON Ferrari, NP

## 2024-05-08 NOTE — Telephone Encounter (Signed)
 Called and left msg. Sent letter as well.

## 2024-08-18 ENCOUNTER — Telehealth: Payer: Self-pay | Admitting: *Deleted

## 2024-08-18 NOTE — Telephone Encounter (Signed)
 Copied from CRM [id number]. Topic: Clinical - Prescription Issue >> Aug 18, 2024 12:08 PM Joesph PARAS wrote: Reason for CRM: Patient is calling to request that sleep study order be placed to do in the hospital. States that the home sleep test did not do right and she tried to tell us  but we didn't listen. Patient  states she was supposed to be fitted for a sleep apnea machine but did not show up to the appointment. Patient does not know if it was for the recent sleep study or not. Patient is very confused. Would like new sleep study if previous did not read right and would like company contact if it did. Please call patient. She wants this done ASAP.  Tried calling the pt- line rang multiple times and then changed to fast busy signal.  PCC's- can you please see why her SNAP study was not scanned in yet? She is calling stating it didn't do right.

## 2024-08-18 NOTE — Telephone Encounter (Signed)
 I checked [employer]. It seems that the patient could not be reached and there was multiple attempts so she could not complete home sleep test.  I have attempted to call the patient 2x, the call went to a busy signal. I'll attempt to call her Monday.

## 2024-08-21 ENCOUNTER — Encounter: Payer: Self-pay | Admitting: Radiology

## 2024-08-21 NOTE — Telephone Encounter (Signed)
 Attempted to call 2x went to busy signal

## 2024-08-22 ENCOUNTER — Encounter: Payer: Self-pay | Admitting: Primary Care

## 2024-08-22 NOTE — Telephone Encounter (Signed)
 2x I attempted to call Patient 2x called [phone]. Busy signal.   2x Attempted to leave on Cell phone voice mail: [phone]. Busy Signal.  Attempted to call moore,richard but goes straight to VM with no voicemialbox set up yet.   We will be sending a letter to patient due to her being unreachable at this time.

## 2024-09-04 NOTE — Telephone Encounter (Signed)
 Copied from CRM [id number]. Topic: Clinical - Prescription Issue >> Aug 18, 2024 12:08 PM Joesph PARAS wrote: Reason for CRM: Patient is calling to request that sleep study order be placed to do in the hospital. States that the home sleep test did not do right and she tried to tell us  but we didn't listen. Patient  states she was supposed to be fitted for a sleep apnea machine but did not show up to the appointment. Patient does not know if it was for the recent sleep study or not. Patient is very confused. Would like new sleep study if previous did not read right and would like company contact if it did. Please call patient. She wants this done ASAP. >> Sep 04, 2024  8:11 AM Russell PARAS wrote: Pt is returning call from clinic regarding her HST. She requested call back  CB#  [phone]  ATC x1.  LVM to return call.

## 2024-09-04 NOTE — Telephone Encounter (Signed)
 Patient returned call.  As I was messaging the front to have her transferred, she hung up.

## 2024-09-05 NOTE — Telephone Encounter (Signed)
 Copied from CRM [id number]. Topic: Clinical - Prescription Issue >> Aug 18, 2024 12:08 PM Danielle [name] wrote: Reason for CRM: Patient is calling to request that sleep study order be placed to do in the hospital. States that the home sleep test did not do right and she tried to tell us  but we didn't listen. Patient  states she was supposed to be fitted for a sleep apnea machine but did not show up to the appointment. Patient does not know if it was for the recent sleep study or not. Patient is very confused. Would like new sleep study if previous did not read right and would like company contact if it did. Please call patient. She wants this done ASAP. >> Sep 05, 2024 10:23 AM Danielle [name] wrote: Pt is returning missed call from [facility] concerning her sleep study. Contacted [facility], and spoke with Almeda who sent message to triage. Was transferred to Ashlyn, who advised to transfer pt over. NFN >> Sep 04, 2024  4:05 PM Danielle [name] wrote: Pt returning call that was made earlier today. Advisd I would have someone call her back if not this evening tomorrow >> Sep 04, 2024  8:11 AM Danielle [name] wrote: Pt is returning call from clinic regarding her HST. She requested call back  CB#  [phone]

## 2024-09-05 NOTE — Telephone Encounter (Signed)
 Pt returned the call. Pt states she would rather do an inlab sleep study as she tried a HST before and she does not tink she is doing it correctly. Pt states she would be better off doing an inlab study to complete this and states her sleep has been worse. Routing to [facility] to advise if we can send a referral for a in lab study.

## 2024-09-06 ENCOUNTER — Encounter: Payer: Self-pay | Admitting: *Deleted

## 2024-09-06 ENCOUNTER — Other Ambulatory Visit: Payer: Self-pay | Admitting: *Deleted

## 2024-09-06 DIAGNOSIS — G4733 Obstructive sleep apnea (adult) (pediatric): Secondary | ICD-10-CM

## 2024-09-06 NOTE — Telephone Encounter (Signed)
 Please order split night sleep study re: OSA Cancel HST

## 2024-09-06 NOTE — Telephone Encounter (Signed)
 HST cancelled and Split night sleep study ordered.  Nothing further needed.

## 2024-09-07 NOTE — Telephone Encounter (Signed)
 Pt called this am to find out if her sleep study had been scheduled.  Pt states she hopes you can schedule the sleep study on the same day as her ride, Rollene Daring is scheduled, and that is Dec 15th in [place]. Please advise.

## 2024-09-07 NOTE — Telephone Encounter (Signed)
 ATC [phone], LVM to return call.    Split night sleep study is scheduled for 11/15/2024.  I called and spoke with patient, I advised her of the date of her sleep study and that she would need to be there by 8 pm.  She asked if she could have someone there with her during the sleep study.  I told her I did not think that would be a problem  She verbalized understanding.  Nothing further needed.

## 2024-11-08 ENCOUNTER — Ambulatory Visit: Payer: Self-pay

## 2024-11-08 NOTE — Telephone Encounter (Signed)
 Patient calling with coughing up thick green mucous, congestion, chills- husband with same symptoms and was seen by Gretta last week. He has gotten better and she has not.  Looking to establish with [facility] provider since her current PCP is not calling her back everytime she reaches out to them.   Appt desk pulled up and grabbed appointment for tomorrow with Dr Bennett to assess and establish care.   Patient ended call when her currrect PCP called back- she will reach back out after she speaks with them.    Message from [place] C sent at 11/08/2024 11:30 AM EST  Reason for Triage: Chills, coughing, congestion, shortness of breath, wheezing

## 2024-11-09 ENCOUNTER — Ambulatory Visit

## 2024-11-09 VITALS — BP 138/84 | HR 77 | Temp 98.0°F | Ht 64.0 in | Wt 303.0 lb

## 2024-11-09 DIAGNOSIS — J4520 Mild intermittent asthma, uncomplicated: Secondary | ICD-10-CM

## 2024-11-09 DIAGNOSIS — D509 Iron deficiency anemia, unspecified: Secondary | ICD-10-CM

## 2024-11-09 DIAGNOSIS — D649 Anemia, unspecified: Secondary | ICD-10-CM | POA: Insufficient documentation

## 2024-11-09 DIAGNOSIS — M25561 Pain in right knee: Secondary | ICD-10-CM | POA: Insufficient documentation

## 2024-11-09 DIAGNOSIS — B9689 Other specified bacterial agents as the cause of diseases classified elsewhere: Secondary | ICD-10-CM

## 2024-11-09 DIAGNOSIS — S62366D Nondisplaced fracture of neck of fifth metacarpal bone, right hand, subsequent encounter for fracture with routine healing: Secondary | ICD-10-CM

## 2024-11-09 DIAGNOSIS — F419 Anxiety disorder, unspecified: Secondary | ICD-10-CM | POA: Insufficient documentation

## 2024-11-09 DIAGNOSIS — R7303 Prediabetes: Secondary | ICD-10-CM | POA: Insufficient documentation

## 2024-11-09 DIAGNOSIS — G8929 Other chronic pain: Secondary | ICD-10-CM

## 2024-11-09 DIAGNOSIS — D508 Other iron deficiency anemias: Secondary | ICD-10-CM

## 2024-11-09 DIAGNOSIS — Z136 Encounter for screening for cardiovascular disorders: Secondary | ICD-10-CM

## 2024-11-09 LAB — HEMOGLOBIN A1C: Hgb A1c MFr Bld: 6.4 % (ref 4.6–6.5)

## 2024-11-09 LAB — CBC WITH DIFFERENTIAL/PLATELET
Basophils Absolute: 0.1 K/uL (ref 0.0–0.1)
Basophils Relative: 1.1 % (ref 0.0–3.0)
Eosinophils Absolute: 0.1 K/uL (ref 0.0–0.7)
Eosinophils Relative: 2.2 % (ref 0.0–5.0)
HCT: 31.1 % — ABNORMAL LOW (ref 36.0–46.0)
Hemoglobin: 10.3 g/dL — ABNORMAL LOW (ref 12.0–15.0)
Lymphocytes Relative: 52.7 % — ABNORMAL HIGH (ref 12.0–46.0)
Lymphs Abs: 3.1 K/uL (ref 0.7–4.0)
MCHC: 33.1 g/dL (ref 30.0–36.0)
MCV: 84 fl (ref 78.0–100.0)
Monocytes Absolute: 0.6 K/uL (ref 0.1–1.0)
Monocytes Relative: 10.9 % (ref 3.0–12.0)
Neutro Abs: 1.9 K/uL (ref 1.4–7.7)
Neutrophils Relative %: 33.1 % — ABNORMAL LOW (ref 43.0–77.0)
Platelets: 359 K/uL (ref 150.0–400.0)
RBC: 3.71 Mil/uL — ABNORMAL LOW (ref 3.87–5.11)
RDW: 17.5 % — ABNORMAL HIGH (ref 11.5–15.5)
WBC: 5.8 K/uL (ref 4.0–10.5)

## 2024-11-09 LAB — IBC + FERRITIN
Ferritin: 10.8 ng/mL (ref 10.0–291.0)
Iron: 51 ug/dL (ref 42–145)
Saturation Ratios: 11.4 % — ABNORMAL LOW (ref 20.0–50.0)
TIBC: 446.6 ug/dL (ref 250.0–450.0)
Transferrin: 319 mg/dL (ref 212.0–360.0)

## 2024-11-09 LAB — LIPID PANEL
Cholesterol: 156 mg/dL (ref 28–200)
HDL: 45 mg/dL
LDL Cholesterol: 101 mg/dL — ABNORMAL HIGH (ref 10–99)
NonHDL: 111.19
Total CHOL/HDL Ratio: 3
Triglycerides: 51 mg/dL (ref 10.0–149.0)
VLDL: 10.2 mg/dL (ref 0.0–40.0)

## 2024-11-09 MED ORDER — OXYMETAZOLINE HCL 0.05 % NA SOLN: 1.0000 | Freq: Two times a day (BID) | NASAL | 0 refills | Status: AC

## 2024-11-09 MED ORDER — GUAIFENESIN ER 600 MG PO TB12: 600.0000 mg | ORAL_TABLET | Freq: Two times a day (BID) | ORAL | 0 refills | Status: AC

## 2024-11-09 MED ORDER — AMOXICILLIN-POT CLAVULANATE 875-125 MG PO TABS: 1.0000 | ORAL_TABLET | Freq: Two times a day (BID) | ORAL | 0 refills | Status: AC

## 2024-11-09 MED ORDER — PROMETHAZINE-DM 6.25-15 MG/5ML PO SYRP: 5.0000 mL | ORAL_SOLUTION | Freq: Every evening | ORAL | 0 refills | Status: AC

## 2024-11-09 MED ORDER — ALBUTEROL SULFATE HFA 108 (90 BASE) MCG/ACT IN AERS: 2.0000 | INHALATION_SPRAY | RESPIRATORY_TRACT | 0 refills | Status: AC | PRN

## 2024-11-09 NOTE — Progress Notes (Unsigned)
" ° °  Subjective:   This visit was conducted in person. The patient gave informed consent to the use of [employer] AI technology to record the contents of the encounter as documented below.   Patient ID: Danielle [name], female    DOB: May 05, 1971, 54 y.o.   MRN: [id number]   Danielle [name] is a very pleasant 54 y.o. female who presents today as a new patient.  Past medical, surgical and family history: Reviewed and updated in chart.  Allergies: Reviewed and updated in chart.  Medications: Reviewed and updated in chart.  Social history: Reviewed and updated in chart.  Last PCP and reason for leaving: Dr Charlanne  Discussed the use of AI scribe software for clinical note transcription with the patient, who gave verbal consent to proceed.  History of Present Illness        Review of Systems      BP 138/84 (BP Location: Left Arm, Patient Position: Sitting, Cuff Size: Large)   Pulse 77   Temp 98 F (36.7 C) (Oral)   Ht 5' 4 (1.626 m)   Wt (!) 303 lb (137.4 kg)   LMP  (LMP Unknown) Comment: probably last cycle February 2024  SpO2 98%   BMI 52.01 kg/m   Objective:     Physical Exam            Assessment & Plan:   New patient, past medical and social history thoroughly reviewed and updated in chart.   Assessment and Plan Assessment & Plan   Cough worst at night, affect sleep, nasal congestion CP, some SOB, pleuritic chest pain?  Uri vs asthma     No follow-ups on file.   Danielle Ent K Laekyn Rayos, MD  11/09/24    Contains text generated by [employer].    "

## 2024-11-09 NOTE — Patient Instructions (Addendum)
 Thank you for visiting [facility] today! Here's what we talked about: - START taking 3 tablets of Zoloft  - START Augmentin  antibiotic, Afrin nasal spray, Promethazine  cough syrup at night only then Mucinex  during the day - Albuterol  inhaler 2 puffs every 4 hours for then next 3 days

## 2024-11-15 ENCOUNTER — Ambulatory Visit (HOSPITAL_BASED_OUTPATIENT_CLINIC_OR_DEPARTMENT_OTHER): Attending: Primary Care | Admitting: Pulmonary Disease

## 2024-11-15 ENCOUNTER — Ambulatory Visit: Payer: Self-pay

## 2024-11-15 DIAGNOSIS — D509 Iron deficiency anemia, unspecified: Secondary | ICD-10-CM

## 2024-11-15 DIAGNOSIS — D649 Anemia, unspecified: Secondary | ICD-10-CM

## 2024-11-15 DIAGNOSIS — G4733 Obstructive sleep apnea (adult) (pediatric): Secondary | ICD-10-CM

## 2024-11-15 MED ORDER — IRON (FERROUS SULFATE) 325 (65 FE) MG PO TABS: 325.0000 mg | ORAL_TABLET | ORAL | 3 refills | Status: AC

## 2024-11-16 NOTE — Progress Notes (Signed)
 Scheduled pt for fasting lab

## 2024-11-22 ENCOUNTER — Encounter: Payer: Self-pay | Admitting: Hematology

## 2024-11-23 ENCOUNTER — Other Ambulatory Visit

## 2024-12-18 ENCOUNTER — Encounter
